# Patient Record
Sex: Male | Born: 1945 | Race: White | Hispanic: No | Marital: Married | Smoking: Current every day smoker
Health system: Southern US, Community
[De-identification: ages and names within clinical notes are randomized; demographics above are authoritative.]

## PROBLEM LIST (undated history)

## (undated) DIAGNOSIS — C61 Malignant neoplasm of prostate: Secondary | ICD-10-CM

## (undated) DIAGNOSIS — I10 Essential (primary) hypertension: Secondary | ICD-10-CM

## (undated) DIAGNOSIS — C801 Malignant (primary) neoplasm, unspecified: Secondary | ICD-10-CM

## (undated) DIAGNOSIS — J449 Chronic obstructive pulmonary disease, unspecified: Secondary | ICD-10-CM

## (undated) DIAGNOSIS — Z87442 Personal history of urinary calculi: Secondary | ICD-10-CM

## (undated) DIAGNOSIS — N189 Chronic kidney disease, unspecified: Secondary | ICD-10-CM

## (undated) DIAGNOSIS — C9590 Leukemia, unspecified not having achieved remission: Secondary | ICD-10-CM

## (undated) DIAGNOSIS — F419 Anxiety disorder, unspecified: Secondary | ICD-10-CM

## (undated) DIAGNOSIS — J189 Pneumonia, unspecified organism: Secondary | ICD-10-CM

## (undated) DIAGNOSIS — I5020 Unspecified systolic (congestive) heart failure: Secondary | ICD-10-CM

## (undated) DIAGNOSIS — I4891 Unspecified atrial fibrillation: Secondary | ICD-10-CM

## (undated) HISTORY — PX: EYE SURGERY: SHX253

## (undated) HISTORY — PX: COLON SURGERY: SHX602

---

## 1997-04-28 HISTORY — PX: PROSTATE SURGERY: SHX751

## 1997-09-05 ENCOUNTER — Other Ambulatory Visit: Admission: RE | Admit: 1997-09-05 | Discharge: 1997-09-05 | Payer: Self-pay | Admitting: Urology

## 1997-09-13 ENCOUNTER — Ambulatory Visit (HOSPITAL_COMMUNITY): Admission: RE | Admit: 1997-09-13 | Discharge: 1997-09-13 | Payer: Self-pay | Admitting: Urology

## 2003-12-29 ENCOUNTER — Ambulatory Visit: Payer: Self-pay

## 2004-03-05 ENCOUNTER — Ambulatory Visit: Payer: Self-pay | Admitting: Internal Medicine

## 2004-05-07 ENCOUNTER — Ambulatory Visit: Payer: Self-pay | Admitting: Internal Medicine

## 2004-08-13 ENCOUNTER — Ambulatory Visit: Payer: Self-pay | Admitting: Internal Medicine

## 2004-12-23 ENCOUNTER — Ambulatory Visit: Payer: Self-pay | Admitting: Internal Medicine

## 2005-06-06 ENCOUNTER — Ambulatory Visit: Payer: Self-pay | Admitting: Internal Medicine

## 2005-06-06 ENCOUNTER — Ambulatory Visit (HOSPITAL_COMMUNITY): Admission: RE | Admit: 2005-06-06 | Discharge: 2005-06-06 | Payer: Self-pay | Admitting: Internal Medicine

## 2005-06-09 ENCOUNTER — Ambulatory Visit: Payer: Self-pay | Admitting: Internal Medicine

## 2005-06-16 ENCOUNTER — Ambulatory Visit: Payer: Self-pay | Admitting: Internal Medicine

## 2005-06-23 ENCOUNTER — Ambulatory Visit: Payer: Self-pay | Admitting: Internal Medicine

## 2005-06-28 ENCOUNTER — Encounter (INDEPENDENT_AMBULATORY_CARE_PROVIDER_SITE_OTHER): Payer: Self-pay | Admitting: Internal Medicine

## 2005-07-18 ENCOUNTER — Ambulatory Visit: Payer: Self-pay | Admitting: Internal Medicine

## 2005-07-25 ENCOUNTER — Ambulatory Visit: Payer: Self-pay | Admitting: Internal Medicine

## 2005-07-27 ENCOUNTER — Emergency Department (HOSPITAL_COMMUNITY): Admission: EM | Admit: 2005-07-27 | Discharge: 2005-07-27 | Payer: Self-pay | Admitting: Emergency Medicine

## 2005-07-29 ENCOUNTER — Emergency Department (HOSPITAL_COMMUNITY): Admission: EM | Admit: 2005-07-29 | Discharge: 2005-07-29 | Payer: Self-pay | Admitting: Emergency Medicine

## 2005-08-25 ENCOUNTER — Ambulatory Visit: Payer: Self-pay | Admitting: Internal Medicine

## 2005-09-17 ENCOUNTER — Ambulatory Visit: Payer: Self-pay | Admitting: Internal Medicine

## 2005-10-01 ENCOUNTER — Ambulatory Visit: Payer: Self-pay | Admitting: Internal Medicine

## 2005-11-04 ENCOUNTER — Ambulatory Visit: Payer: Self-pay | Admitting: Internal Medicine

## 2005-12-04 ENCOUNTER — Ambulatory Visit: Payer: Self-pay | Admitting: Internal Medicine

## 2006-02-20 ENCOUNTER — Ambulatory Visit: Payer: Self-pay | Admitting: Internal Medicine

## 2006-03-13 ENCOUNTER — Ambulatory Visit: Payer: Self-pay | Admitting: Internal Medicine

## 2006-03-23 DIAGNOSIS — C9110 Chronic lymphocytic leukemia of B-cell type not having achieved remission: Secondary | ICD-10-CM | POA: Insufficient documentation

## 2006-03-23 DIAGNOSIS — R32 Unspecified urinary incontinence: Secondary | ICD-10-CM | POA: Insufficient documentation

## 2006-03-23 DIAGNOSIS — K589 Irritable bowel syndrome without diarrhea: Secondary | ICD-10-CM | POA: Insufficient documentation

## 2006-03-23 DIAGNOSIS — Z862 Personal history of diseases of the blood and blood-forming organs and certain disorders involving the immune mechanism: Secondary | ICD-10-CM | POA: Insufficient documentation

## 2006-03-23 DIAGNOSIS — Z8546 Personal history of malignant neoplasm of prostate: Secondary | ICD-10-CM

## 2006-03-23 DIAGNOSIS — I10 Essential (primary) hypertension: Secondary | ICD-10-CM | POA: Insufficient documentation

## 2006-03-23 DIAGNOSIS — I252 Old myocardial infarction: Secondary | ICD-10-CM | POA: Insufficient documentation

## 2006-03-23 DIAGNOSIS — F329 Major depressive disorder, single episode, unspecified: Secondary | ICD-10-CM | POA: Insufficient documentation

## 2006-03-23 DIAGNOSIS — E669 Obesity, unspecified: Secondary | ICD-10-CM

## 2006-03-23 DIAGNOSIS — F411 Generalized anxiety disorder: Secondary | ICD-10-CM | POA: Insufficient documentation

## 2006-03-23 DIAGNOSIS — Z8639 Personal history of other endocrine, nutritional and metabolic disease: Secondary | ICD-10-CM

## 2006-03-23 DIAGNOSIS — K5900 Constipation, unspecified: Secondary | ICD-10-CM | POA: Insufficient documentation

## 2006-03-23 DIAGNOSIS — J40 Bronchitis, not specified as acute or chronic: Secondary | ICD-10-CM | POA: Insufficient documentation

## 2006-03-31 ENCOUNTER — Ambulatory Visit: Payer: Self-pay | Admitting: Internal Medicine

## 2009-03-31 ENCOUNTER — Emergency Department (HOSPITAL_COMMUNITY): Admission: EM | Admit: 2009-03-31 | Discharge: 2009-03-31 | Payer: Self-pay | Admitting: Emergency Medicine

## 2009-04-10 ENCOUNTER — Encounter (INDEPENDENT_AMBULATORY_CARE_PROVIDER_SITE_OTHER): Payer: Self-pay | Admitting: Internal Medicine

## 2009-04-10 ENCOUNTER — Ambulatory Visit: Payer: Self-pay | Admitting: Cardiology

## 2009-04-10 ENCOUNTER — Inpatient Hospital Stay (HOSPITAL_COMMUNITY): Admission: EM | Admit: 2009-04-10 | Discharge: 2009-04-13 | Payer: Self-pay | Admitting: Emergency Medicine

## 2009-04-17 ENCOUNTER — Ambulatory Visit: Payer: Self-pay | Admitting: Cardiology

## 2009-04-17 ENCOUNTER — Inpatient Hospital Stay (HOSPITAL_COMMUNITY): Admission: EM | Admit: 2009-04-17 | Discharge: 2009-04-19 | Payer: Self-pay | Admitting: Emergency Medicine

## 2010-04-28 HISTORY — PX: PARTIAL COLECTOMY: SHX5273

## 2010-05-19 ENCOUNTER — Encounter: Payer: Self-pay | Admitting: Cardiology

## 2010-05-20 ENCOUNTER — Encounter: Payer: Self-pay | Admitting: Urology

## 2010-07-29 LAB — POCT CARDIAC MARKERS: CKMB, poc: 4 ng/mL (ref 1.0–8.0)

## 2010-07-29 LAB — CBC
HCT: 36.7 % — ABNORMAL LOW (ref 39.0–52.0)
HCT: 44.3 % (ref 39.0–52.0)
Hemoglobin: 12.4 g/dL — ABNORMAL LOW (ref 13.0–17.0)
Hemoglobin: 12.4 g/dL — ABNORMAL LOW (ref 13.0–17.0)
MCHC: 33.3 g/dL (ref 30.0–36.0)
MCV: 91.1 fL (ref 78.0–100.0)
MCV: 91.8 fL (ref 78.0–100.0)
RBC: 4 MIL/uL — ABNORMAL LOW (ref 4.22–5.81)
RBC: 4.86 MIL/uL (ref 4.22–5.81)
RDW: 15.5 % (ref 11.5–15.5)
WBC: 12 10*3/uL — ABNORMAL HIGH (ref 4.0–10.5)
WBC: 15.6 10*3/uL — ABNORMAL HIGH (ref 4.0–10.5)

## 2010-07-29 LAB — CARDIAC PANEL(CRET KIN+CKTOT+MB+TROPI)
CK, MB: 3 ng/mL (ref 0.3–4.0)
Relative Index: INVALID (ref 0.0–2.5)
Total CK: 47 U/L (ref 7–232)
Troponin I: 0.05 ng/mL (ref 0.00–0.06)

## 2010-07-29 LAB — DIFFERENTIAL
Basophils Absolute: 0 10*3/uL (ref 0.0–0.1)
Basophils Relative: 0 % (ref 0–1)
Eosinophils Absolute: 0 10*3/uL (ref 0.0–0.7)
Eosinophils Absolute: 0 10*3/uL (ref 0.0–0.7)
Eosinophils Absolute: 0.9 10*3/uL — ABNORMAL HIGH (ref 0.0–0.7)
Eosinophils Relative: 0 % (ref 0–5)
Eosinophils Relative: 0 % (ref 0–5)
Eosinophils Relative: 7 % — ABNORMAL HIGH (ref 0–5)
Lymphocytes Relative: 20 % (ref 12–46)
Lymphs Abs: 0.4 10*3/uL — ABNORMAL LOW (ref 0.7–4.0)
Lymphs Abs: 2.4 10*3/uL (ref 0.7–4.0)
Monocytes Absolute: 0.7 10*3/uL (ref 0.1–1.0)
Monocytes Relative: 3 % (ref 3–12)
Monocytes Relative: 6 % (ref 3–12)
Neutro Abs: 11.9 10*3/uL — ABNORMAL HIGH (ref 1.7–7.7)

## 2010-07-29 LAB — BASIC METABOLIC PANEL
BUN: 15 mg/dL (ref 6–23)
BUN: 21 mg/dL (ref 6–23)
CO2: 26 mEq/L (ref 19–32)
CO2: 30 mEq/L (ref 19–32)
CO2: 30 mEq/L (ref 19–32)
Calcium: 8.5 mg/dL (ref 8.4–10.5)
Calcium: 9.1 mg/dL (ref 8.4–10.5)
Chloride: 100 mEq/L (ref 96–112)
Chloride: 108 mEq/L (ref 96–112)
Chloride: 96 mEq/L (ref 96–112)
Creatinine, Ser: 0.94 mg/dL (ref 0.4–1.5)
Creatinine, Ser: 1.02 mg/dL (ref 0.4–1.5)
GFR calc Af Amer: >60 mL/min (ref >60)
GFR calc non Af Amer: >60 mL/min (ref >60)
GFR calc non Af Amer: >60 mL/min (ref >60)
Glucose, Bld: 102 mg/dL — ABNORMAL HIGH (ref 70–99)
Glucose, Bld: 126 mg/dL — ABNORMAL HIGH (ref 70–99)
Glucose, Bld: 146 mg/dL — ABNORMAL HIGH (ref 70–99)
Potassium: 2.9 mEq/L — ABNORMAL LOW (ref 3.5–5.1)
Potassium: 3.1 mEq/L — ABNORMAL LOW (ref 3.5–5.1)
Potassium: 3.7 mEq/L (ref 3.5–5.1)
Sodium: 136 mEq/L (ref 135–145)
Sodium: 140 mEq/L (ref 135–145)
Sodium: 142 mEq/L (ref 135–145)

## 2010-07-29 LAB — LIPID PANEL
Cholesterol: 131 mg/dL (ref 0–200)
HDL: 40 mg/dL (ref >39)
Total CHOL/HDL Ratio: 3.1 RATIO
Total CHOL/HDL Ratio: 3.3 RATIO
Triglycerides: 119 mg/dL (ref <150)
VLDL: 24 mg/dL (ref 0–40)

## 2010-07-29 LAB — BLOOD GAS, ARTERIAL
Acid-Base Excess: 4.9 mmol/L — ABNORMAL HIGH (ref 0.0–2.0)
Bicarbonate: 29.9 mEq/L — ABNORMAL HIGH (ref 20.0–24.0)
FIO2: 100 %
O2 Saturation: 99.5 %
TCO2: 26.9 mmol/L (ref 0–100)
pO2, Arterial: 282 mmHg — ABNORMAL HIGH (ref 80.0–100.0)

## 2010-07-29 LAB — D-DIMER, QUANTITATIVE

## 2010-07-29 LAB — MAGNESIUM: Magnesium: 1.9 mg/dL (ref 1.5–2.5)

## 2010-07-29 LAB — THEOPHYLLINE LEVEL: Theophylline Lvl: 6.8 ug/mL — ABNORMAL LOW (ref 10.0–20.0)

## 2010-07-29 LAB — GLUCOSE, CAPILLARY: Glucose-Capillary: 155 mg/dL — ABNORMAL HIGH (ref 70–99)

## 2010-07-30 LAB — CARDIAC PANEL(CRET KIN+CKTOT+MB+TROPI)
CK, MB: 3.7 ng/mL (ref 0.3–4.0)
Relative Index: INVALID (ref 0.0–2.5)
Relative Index: INVALID (ref 0.0–2.5)
Total CK: 52 U/L (ref 7–232)
Troponin I: 0.06 ng/mL (ref 0.00–0.06)

## 2010-07-30 LAB — BLOOD GAS, ARTERIAL
Acid-Base Excess: 1.7 mmol/L (ref 0.0–2.0)
Acid-Base Excess: 1.8 mmol/L (ref 0.0–2.0)
O2 Saturation: 89.1 %
TCO2: 23.5 mmol/L (ref 0–100)
pCO2 arterial: 42.8 mmHg (ref 35.0–45.0)

## 2010-07-30 LAB — DIFFERENTIAL
Basophils Absolute: 0 10*3/uL (ref 0.0–0.1)
Eosinophils Relative: 4 % (ref 0–5)
Eosinophils Relative: 8 % — ABNORMAL HIGH (ref 0–5)
Lymphocytes Relative: 18 % (ref 12–46)
Lymphocytes Relative: 26 % (ref 12–46)
Lymphs Abs: 1.8 10*3/uL (ref 0.7–4.0)
Lymphs Abs: 1.4 10*3/uL (ref 0.7–4.0)
Neutro Abs: 7.3 10*3/uL (ref 1.7–7.7)
Neutrophils Relative %: 73 % (ref 43–77)
Neutrophils Relative %: 58 % (ref 43–77)

## 2010-07-30 LAB — CBC
HCT: 35.7 % — ABNORMAL LOW (ref 39.0–52.0)
MCHC: 33.8 g/dL (ref 30.0–36.0)
MCV: 90.7 fL (ref 78.0–100.0)
MCV: 92.1 fL (ref 78.0–100.0)
Platelets: 270 10*3/uL (ref 150–400)
Platelets: 259 10*3/uL (ref 150–400)
RBC: 3.82 MIL/uL — ABNORMAL LOW (ref 4.22–5.81)
RDW: 14.5 % (ref 11.5–15.5)
RDW: 14.8 % (ref 11.5–15.5)
WBC: 9.9 10*3/uL (ref 4.0–10.5)
WBC: 12.8 10*3/uL — ABNORMAL HIGH (ref 4.0–10.5)

## 2010-07-30 LAB — COMPREHENSIVE METABOLIC PANEL
AST: 17 U/L (ref 0–37)
CO2: 30 mEq/L (ref 19–32)
Calcium: 8.9 mg/dL (ref 8.4–10.5)
Creatinine, Ser: 1.05 mg/dL (ref 0.4–1.5)
GFR calc Af Amer: >60 mL/min (ref >60)
GFR calc non Af Amer: >60 mL/min (ref >60)
Total Protein: 6.2 g/dL (ref 6.0–8.3)

## 2010-07-30 LAB — GLUCOSE, CAPILLARY

## 2010-07-30 LAB — POCT CARDIAC MARKERS
CKMB, poc: 1.6 ng/mL (ref 1.0–8.0)
Myoglobin, poc: 66.4 ng/mL (ref 12–200)
Troponin i, poc: <0.05 ng/mL (ref 0.00–0.09)

## 2010-07-30 LAB — BASIC METABOLIC PANEL
BUN: 20 mg/dL (ref 6–23)
BUN: 17 mg/dL (ref 6–23)
Calcium: 9.1 mg/dL (ref 8.4–10.5)
Chloride: 106 mEq/L (ref 96–112)
GFR calc non Af Amer: >60 mL/min (ref >60)
Glucose, Bld: 126 mg/dL — ABNORMAL HIGH (ref 70–99)
Glucose, Bld: 160 mg/dL — ABNORMAL HIGH (ref 70–99)
Potassium: 3.5 mEq/L (ref 3.5–5.1)

## 2010-07-30 LAB — BRAIN NATRIURETIC PEPTIDE: Pro B Natriuretic peptide (BNP): 45.1 pg/mL (ref 0.0–100.0)

## 2010-07-30 LAB — PROTIME-INR
INR: 0.86 (ref 0.00–1.49)
Prothrombin Time: 11.6 seconds (ref 11.6–15.2)

## 2011-01-14 ENCOUNTER — Encounter: Payer: Self-pay | Admitting: *Deleted

## 2011-01-14 ENCOUNTER — Emergency Department (HOSPITAL_COMMUNITY)
Admission: EM | Admit: 2011-01-14 | Discharge: 2011-01-14 | Discharge status: Against Medical Advice | Payer: Medicare Other | Attending: Emergency Medicine | Admitting: Emergency Medicine

## 2011-01-14 ENCOUNTER — Emergency Department (HOSPITAL_COMMUNITY): Payer: Medicare Other

## 2011-01-14 ENCOUNTER — Other Ambulatory Visit: Payer: Self-pay

## 2011-01-14 DIAGNOSIS — F17200 Nicotine dependence, unspecified, uncomplicated: Secondary | ICD-10-CM | POA: Insufficient documentation

## 2011-01-14 DIAGNOSIS — I509 Heart failure, unspecified: Secondary | ICD-10-CM | POA: Insufficient documentation

## 2011-01-14 DIAGNOSIS — J189 Pneumonia, unspecified organism: Secondary | ICD-10-CM | POA: Insufficient documentation

## 2011-01-14 DIAGNOSIS — I10 Essential (primary) hypertension: Secondary | ICD-10-CM | POA: Insufficient documentation

## 2011-01-14 DIAGNOSIS — Z8546 Personal history of malignant neoplasm of prostate: Secondary | ICD-10-CM | POA: Insufficient documentation

## 2011-01-14 DIAGNOSIS — I2510 Atherosclerotic heart disease of native coronary artery without angina pectoris: Secondary | ICD-10-CM | POA: Insufficient documentation

## 2011-01-14 DIAGNOSIS — Z856 Personal history of leukemia: Secondary | ICD-10-CM | POA: Insufficient documentation

## 2011-01-14 DIAGNOSIS — Z85038 Personal history of other malignant neoplasm of large intestine: Secondary | ICD-10-CM | POA: Insufficient documentation

## 2011-01-14 DIAGNOSIS — I440 Atrioventricular block, first degree: Secondary | ICD-10-CM | POA: Insufficient documentation

## 2011-01-14 DIAGNOSIS — R079 Chest pain, unspecified: Secondary | ICD-10-CM

## 2011-01-14 HISTORY — DX: Malignant (primary) neoplasm, unspecified: C80.1

## 2011-01-14 HISTORY — DX: Leukemia, unspecified not having achieved remission: C95.90

## 2011-01-14 HISTORY — DX: Essential (primary) hypertension: I10

## 2011-01-14 HISTORY — DX: Chronic obstructive pulmonary disease, unspecified: J44.9

## 2011-01-14 HISTORY — DX: Malignant neoplasm of prostate: C61

## 2011-01-14 LAB — COMPREHENSIVE METABOLIC PANEL
ALT: 11 U/L (ref 0–53)
Alkaline Phosphatase: 54 U/L (ref 39–117)
CO2: 30 mEq/L (ref 19–32)
GFR calc Af Amer: >60 mL/min (ref >60)
GFR calc non Af Amer: >60 mL/min (ref >60)
Glucose, Bld: 89 mg/dL (ref 70–99)
Potassium: 4.2 mEq/L (ref 3.5–5.1)
Sodium: 137 mEq/L (ref 135–145)
Total Protein: 6 g/dL (ref 6.0–8.3)

## 2011-01-14 LAB — CBC
HCT: 36.5 % — ABNORMAL LOW (ref 39.0–52.0)
Hemoglobin: 11.7 g/dL — ABNORMAL LOW (ref 13.0–17.0)
WBC: 5 10*3/uL (ref 4.0–10.5)

## 2011-01-14 MED ORDER — SODIUM CHLORIDE 0.9 % IJ SOLN
3.0000 mL | Freq: Two times a day (BID) | INTRAMUSCULAR | Status: DC
  Filled 2011-01-14: qty 3

## 2011-01-14 MED ORDER — ASPIRIN 81 MG PO CHEW
324.0000 mg | CHEWABLE_TABLET | Freq: Once | ORAL | Status: AC
  Administered 2011-01-14: 324 mg via ORAL
  Filled 2011-01-14: qty 4

## 2011-01-14 MED ORDER — NITROGLYCERIN 2 % TD OINT
1.0000 [in_us] | TOPICAL_OINTMENT | Freq: Four times a day (QID) | TRANSDERMAL | Status: DC
  Administered 2011-01-14: 1 [in_us] via TOPICAL
  Filled 2011-01-14: qty 1

## 2011-01-14 MED ORDER — SODIUM CHLORIDE 0.9 % IJ SOLN
3.0000 mL | INTRAMUSCULAR | Status: DC | PRN
  Filled 2011-01-14: qty 3

## 2011-01-14 MED ORDER — MORPHINE SULFATE 4 MG/ML IJ SOLN
4.0000 mg | Freq: Once | INTRAMUSCULAR | Status: AC
  Administered 2011-01-14: 4 mg via INTRAVENOUS
  Filled 2011-01-14: qty 1

## 2011-01-14 MED ORDER — MOXIFLOXACIN HCL 400 MG PO TABS: 400.0000 mg | ORAL_TABLET | Freq: Every day | ORAL | Status: AC

## 2011-01-14 NOTE — ED Notes (Signed)
Reports increased sob of the last 3 days. Describes chest pain as a pressure, ha sused nebulizers w/ little relief.

## 2011-01-14 NOTE — ED Notes (Signed)
Pt self ambulated out with a steady gait stating no needs. ama papers signed by pt and doctor

## 2011-01-14 NOTE — ED Provider Notes (Signed)
Scribed for Mitchell Kras, MD, the patient was seen in room APA02/APA02 . This chart was scribed by Mitchell [name]. This patient's care was started at 8:24 PM.   CSN: [id number] Arrival date & time: 01/14/2011  8:21 PM   Chief Complaint  Patient presents with  . Shortness of Breath  . Chest Pain     (Include location/radiation/quality/duration/timing/severity/associated sxs/prior treatment) HPI Mitchell [name] is a 65 y.o. male with a history of COPD presents to the Emergency Department complaining of SOB that has become progressively worse for the past week with associated chest pain. Chest pain is described as a tightness/pressure that is aggravated by coughing and rated 6/10 in severity. Pt has treated with nebulizer with little improvement. Pt denies fever. Pt reports a hx of similar sx. Pt has hx of pneumonia. Denies hx of MI. Reports having a catheretization. There are no other associated symptoms and no other alleviating or aggravating factors.   Past Medical History  Diagnosis Date  . COPD (chronic obstructive pulmonary disease)   . Cancer   . Prostate cancer   . Colon cancer   . Leukemia   . Hypertension     Past Surgical History  Procedure Date  . Colon surgery   . Prostate surgery     History reviewed. No pertinent family history.  History  Substance Use Topics  . Smoking status: Current Everyday Smoker -- 0.5 packs/day  . Smokeless tobacco: Not on file  . Alcohol Use: No    Review of Systems 10 Systems reviewed and are negative for acute change except as noted in the HPI.  Allergies  Review of patient's allergies indicates no known allergies.  Home Medications  No current outpatient prescriptions on file.  Physical Exam    BP 130/75  Pulse 67  Temp(Src) 98.5 F (36.9 C) (Oral)  Resp 23  Ht 6' (1.829 m)  Wt 226 lb (102.513 kg)  BMI 30.65 kg/m2  SpO2 96%  Physical Exam  Nursing note and vitals reviewed. Constitutional: He appears well-developed  and well-nourished. No distress.       Appears obese   HENT:  Head: Normocephalic and atraumatic.  Right Ear: External ear normal.  Left Ear: External ear normal.  Eyes: Conjunctivae are normal. Right eye exhibits no discharge. Left eye exhibits no discharge. No scleral icterus.  Neck: Neck supple. No JVD present. No tracheal deviation present.  Cardiovascular: Normal rate, regular rhythm and intact distal pulses.   Pulmonary/Chest: Effort normal and breath sounds normal. No stridor. No respiratory distress. He has no wheezes. He has no rales. He exhibits no tenderness (No tenderness to palpation).       Lungs clear to auscultation.   Abdominal: Soft. Bowel sounds are normal. He exhibits no distension. There is no tenderness. There is no rebound and no guarding.  Musculoskeletal: He exhibits no edema and no tenderness.  Neurological: He is alert. He has normal strength. No sensory deficit. Cranial nerve deficit:  no gross defecits noted. He exhibits normal muscle tone. He displays no seizure activity. Coordination normal.  Skin: Skin is warm and dry. No rash noted.  Psychiatric: He has a normal mood and affect.   Procedures  OTHER DATA REVIEWED: Nursing notes, vital signs, and past medical records reviewed.  DIAGNOSTIC STUDIES: Oxygen Saturation is 96% on room air, normal by my interpretation.    LABS / RADIOLOGY:  Labs Reviewed  CBC - Abnormal; Notable for the following:    RBC 3.80 (*)  Hemoglobin 11.7 (*)    HCT 36.5 (*)    All other components within normal limits  PRO B NATRIURETIC PEPTIDE  COMPREHENSIVE METABOLIC PANEL  TROPONIN I   Dg Chest Portable 1 View  01/14/2011  *RADIOLOGY REPORT*  Clinical Data: 65 year old male with shortness of breath and chest pain.  PORTABLE CHEST - 1 VIEW  Comparison: 04/17/2009 and earlier.  Findings: Portable semi upright AP view at 2046 hours.  Loss of the hemidiaphragm contour.  Right lung base air bronchograms.  No pneumothorax,  pulmonary edema or large effusion.  Upper lobes are clear. Visualized tracheal air column is within normal limits. Stable cardiac size and mediastinal contours.  IMPRESSION: Loss of the contour of the diaphragm compatible with bibasilar airspace disease.  Air bronchograms noted on the right.  While nonspecific, consider pneumonia, aspiration.  Original Report Authenticated By: Harley Hallmark, M.D.    ED COURSE / COORDINATION OF CARE: 20:24 EDP at Pt bedside. Discussed with Pt treatment plan including O2 supplementation,  ECG, cardiac screening panel, and chest x ray.   Date: 01/14/2011  Rate: 67  Rhythm: normal sinus rhythm  QRS Axis: left  Intervals: normal  ST/T Wave abnormalities: normal  Conduction Disutrbances:first-degree A-V block   Narrative Interpretation: sinus arrhythmia  Old EKG Reviewed: unchanged   MDM: Patient has history of COPD without known history of coronary artery disease. He presents with symptoms that are concerning for the possibility of angina. However, he does have a possible pneumonia on the chest x-ray does have an increase in his BMP suggesting the possibility of congestive heart failure. At this point I recommend admission. I will start IV antibiotics.  Admission diagnosis: Community acquired pneumonia 2 congestive heart failure SCRIBE ATTESTATION: I personally performed the services described in this documentation, which was scribed in my presence.  The recorded information has been reviewed and considered.    10:18 PM I discussed the findings with the patient and explained to him that I am  concerned he could be having a heart attack. I explained that I cannot definitively exclude that based on the findings today. I told them I recommended admission with treatment with antibiotics. Patient is here with a family member and it cleared him that he could be having a life-threatening condition. Patient understands that and stated he was going home. We'll discharge  him AGAINST MEDICAL ADVICE. I will prescribe a prescription for oral antibiotics.     Mitchell Kras, MD 01/14/11 2219

## 2011-01-14 NOTE — ED Notes (Signed)
Chest pressure 1/10 after nitro patch

## 2011-01-30 ENCOUNTER — Encounter (HOSPITAL_COMMUNITY): Payer: Self-pay | Admitting: Emergency Medicine

## 2011-01-30 ENCOUNTER — Other Ambulatory Visit: Payer: Self-pay

## 2011-01-30 ENCOUNTER — Emergency Department (HOSPITAL_COMMUNITY): Payer: Medicare Other

## 2011-01-30 ENCOUNTER — Emergency Department (HOSPITAL_COMMUNITY)
Admission: EM | Admit: 2011-01-30 | Discharge: 2011-01-30 | Disposition: A | Discharge status: Home / Self Care | Payer: Medicare Other | Attending: Emergency Medicine | Admitting: Emergency Medicine

## 2011-01-30 DIAGNOSIS — R059 Cough, unspecified: Secondary | ICD-10-CM | POA: Insufficient documentation

## 2011-01-30 DIAGNOSIS — F17200 Nicotine dependence, unspecified, uncomplicated: Secondary | ICD-10-CM | POA: Insufficient documentation

## 2011-01-30 DIAGNOSIS — J4489 Other specified chronic obstructive pulmonary disease: Secondary | ICD-10-CM | POA: Insufficient documentation

## 2011-01-30 DIAGNOSIS — J449 Chronic obstructive pulmonary disease, unspecified: Secondary | ICD-10-CM

## 2011-01-30 DIAGNOSIS — Z79899 Other long term (current) drug therapy: Secondary | ICD-10-CM | POA: Insufficient documentation

## 2011-01-30 DIAGNOSIS — R05 Cough: Secondary | ICD-10-CM | POA: Insufficient documentation

## 2011-01-30 DIAGNOSIS — Z9981 Dependence on supplemental oxygen: Secondary | ICD-10-CM | POA: Insufficient documentation

## 2011-01-30 DIAGNOSIS — R0602 Shortness of breath: Secondary | ICD-10-CM | POA: Insufficient documentation

## 2011-01-30 HISTORY — DX: Pneumonia, unspecified organism: J18.9

## 2011-01-30 LAB — DIFFERENTIAL
Basophils Absolute: 0 10*3/uL (ref 0.0–0.1)
Basophils Relative: 1 % (ref 0–1)
Lymphocytes Relative: 31 % (ref 12–46)
Monocytes Absolute: 0.5 10*3/uL (ref 0.1–1.0)
Monocytes Relative: 10 % (ref 3–12)
Neutro Abs: 2.2 10*3/uL (ref 1.7–7.7)
Neutrophils Relative %: 49 % (ref 43–77)

## 2011-01-30 LAB — CBC
HCT: 38.5 % — ABNORMAL LOW (ref 39.0–52.0)
Hemoglobin: 12.6 g/dL — ABNORMAL LOW (ref 13.0–17.0)
MCHC: 32.7 g/dL (ref 30.0–36.0)
RDW: 14 % (ref 11.5–15.5)
WBC: 4.5 10*3/uL (ref 4.0–10.5)

## 2011-01-30 LAB — BASIC METABOLIC PANEL
Chloride: 103 mEq/L (ref 96–112)
GFR calc Af Amer: >90 mL/min (ref >90)
GFR calc non Af Amer: 85 mL/min — ABNORMAL LOW (ref >90)
Potassium: 3.6 mEq/L (ref 3.5–5.1)

## 2011-01-30 MED ORDER — PREDNISONE 50 MG PO TABS: 50.0000 mg | ORAL_TABLET | Freq: Every day | ORAL | Status: AC

## 2011-01-30 MED ORDER — PREDNISONE 20 MG PO TABS
60.0000 mg | ORAL_TABLET | Freq: Once | ORAL | Status: AC
  Administered 2011-01-30: 60 mg via ORAL
  Filled 2011-01-30: qty 3

## 2011-01-30 MED ORDER — IPRATROPIUM BROMIDE 0.02 % IN SOLN
0.5000 mg | Freq: Once | RESPIRATORY_TRACT | Status: AC
  Administered 2011-01-30: 0.5 mg via RESPIRATORY_TRACT
  Filled 2011-01-30: qty 2.5

## 2011-01-30 MED ORDER — SODIUM CHLORIDE 0.9 % IN NEBU
INHALATION_SOLUTION | RESPIRATORY_TRACT | Status: AC
  Filled 2011-01-30: qty 3

## 2011-01-30 MED ORDER — HYDROCODONE-ACETAMINOPHEN 5-500 MG PO TABS: 1.0000 | ORAL_TABLET | Freq: Four times a day (QID) | ORAL | Status: AC | PRN

## 2011-01-30 MED ORDER — ALBUTEROL SULFATE (5 MG/ML) 0.5% IN NEBU
5.0000 mg | INHALATION_SOLUTION | Freq: Once | RESPIRATORY_TRACT | Status: AC
  Administered 2011-01-30: 5 mg via RESPIRATORY_TRACT
  Filled 2011-01-30: qty 1

## 2011-01-30 NOTE — ED Provider Notes (Signed)
History   Scribed for Dr. Bebe Shaggy, the patient was seen in room APA06/APA06. This chart was scribed by Clarita Crane. This patient's care was started at 12:51PM.  CSN: [id number] Arrival date & time: 01/30/2011 12:11 PM  Chief Complaint  Patient presents with  . Cough   HPI Mitchell [name] is a 65 y.o. male who presents to the Emergency Department complaining of constant SOB onset several days ago and persistent since with associated mild productive cough. Patient also notes have a syncopal episode 4 days ago while walking in a [employer]. Denies chest pain, fever, hemoptysis, nausea, vomiting, diarrhea, hematochezia.  Patient with h/o COPD, colon cancer, prostate cancer and leukemia, Is currently being treated for prostate cancer and leukemia. Patient is a current smoker and is on home O2 10-12 hours per day. Denies recent hospitalizations.   HPI ELEMENTS: Onset: several days ago Duration: persistent since onset  Timing: constant   Context:  as above  Associated symptoms: +productive cough.  Denies chest pain, fever, hemoptysis, nausea, vomiting, diarrhea, hematochezia.   PAST MEDICAL HISTORY:  Past Medical History  Diagnosis Date  . COPD (chronic obstructive pulmonary disease)   . Cancer   . Prostate cancer   . Colon cancer   . Leukemia   . Hypertension   . Pneumonia   . Leukemia     PAST SURGICAL HISTORY:  Past Surgical History  Procedure Date  . Colon surgery   . Prostate surgery     FAMILY HISTORY:  History reviewed. No pertinent family history.   SOCIAL HISTORY: History   Social History  . Marital Status: Married    Spouse Name: N/A    Number of Children: N/A  . Years of Education: N/A   Social History Main Topics  . Smoking status: Current Everyday Smoker -- 0.5 packs/day  . Smokeless tobacco: None  . Alcohol Use: No  . Drug Use: No  . Sexually Active:    Other Topics Concern  . None   Social History Narrative  . None      Review of Systems 10  Systems reviewed and are negative for acute change except as noted in the HPI.  Allergies  Review of patient's allergies indicates no known allergies.  Home Medications   Current Outpatient Rx  Name Route Sig Dispense Refill  . ALBUTEROL SULFATE HFA 108 (90 BASE) MCG/ACT IN AERS Inhalation Inhale 2 puffs into the lungs every 6 (six) hours as needed. For shortness of breath     . ALPRAZOLAM 1 MG PO TABS Oral Take 1 mg by mouth at bedtime as needed. For sleep     . CITALOPRAM HYDROBROMIDE 40 MG PO TABS Oral Take 40 mg by mouth daily.      Marland Kitchen DOCUSATE SODIUM 100 MG PO CAPS Oral Take 100 mg by mouth as needed. For constipation     . FLUTICASONE-SALMETEROL 250-50 MCG/DOSE IN AEPB Inhalation Inhale 1 puff into the lungs every 12 (twelve) hours.      Marland Kitchen HYDROCODONE-ACETAMINOPHEN 10-500 MG PO TABS Oral Take 1 tablet by mouth 3 (three) times daily as needed. For pain    . IMATINIB MESYLATE 400 MG PO TABS Oral Take 400 mg by mouth every evening. Take with meals and large glass of water.Caution:Chemotherapy.     Marland Kitchen LISINOPRIL-HYDROCHLOROTHIAZIDE 20-25 MG PO TABS Oral Take 2 tablets by mouth daily.      . MOMETASONE FUROATE 0.1 % EX CREA Topical Apply 1 application topically daily as needed. Skin lesions     .  POTASSIUM CHLORIDE 10 MEQ PO TBCR Oral Take 30 mEq by mouth daily.      . THEOPHYLLINE 300 MG PO CP24 Oral Take 300 mg by mouth 2 (two) times daily.      . VENLAFAXINE HCL 75 MG PO TABS Oral Take 75 mg by mouth 2 (two) times daily.      Marland Kitchen LUPRON IJ Injection Inject 1 each as directed every 4 (four) months.      . OXYCODONE HCL 10 MG PO TB12 Oral Take 10 mg by mouth every 12 (twelve) hours.      . THEOPHYLLINE PO Oral Take 1 tablet by mouth 2 (two) times daily.     . EFFEXOR PO Oral Take 1 tablet by mouth 2 (two) times daily.        BP 110/49  Pulse 94  Temp 98.5 F (36.9 C)  Resp 20  Ht 6' (1.829 m)  Wt 223 lb (101.152 kg)  BMI 30.24 kg/m2  SpO2 95%  Physical Exam CONSTITUTIONAL: Well  developed/well nourished HEAD AND FACE: Normocephalic/atraumatic EYES: EOMI/PERRL, conjunctiva pink NECK: supple no meningeal signs CV: S1/S2 noted, no murmurs/rubs/gallops noted, DP and PT pulses strong LUNGS: wheezing bilaterally, pt able to speak to me clearly ABDOMEN: soft, nontender, no rebound or guarding GU:no cva tenderness NEURO: Pt is awake/alert, moves all extremitiesx4 EXTREMITIES: pulses normal, full ROM SKIN: warm, pallor PSYCH: no abnormalities of mood noted  ED Course  Procedures MDM   OTHER DATA REVIEWED: Nursing notes, vital signs, and past medical records reviewed. Lab results reviewed and considered Imaging results reviewed and considered  DIAGNOSTIC STUDIES: Oxygen Saturation is 94% on nasal canula-2L, adequate by my interpretation.     Date: 01/30/2011  Rate: 90  Rhythm: normal sinus rhythm  QRS Axis: left  Intervals: normal  ST/T Wave abnormalities: nonspecific ST changes  Conduction Disutrbances:none  Narrative Interpretation:   Old EKG Reviewed: unchanged  LABS / RADIOLOGY: Results for orders placed during the hospital encounter of 01/30/11  BASIC METABOLIC PANEL      Component Value Range   Sodium 139  135 - 145 (mEq/L)   Potassium 3.6  3.5 - 5.1 (mEq/L)   Chloride 103  96 - 112 (mEq/L)   CO2 29  19 - 32 (mEq/L)   Glucose, Bld 113 (*) 70 - 99 (mg/dL)   BUN 19  6 - 23 (mg/dL)   Creatinine, Ser 9.56  0.50 - 1.35 (mg/dL)   Calcium 9.4  8.4 - 21.3 (mg/dL)   GFR calc non Af Amer 85 (*) >90 (mL/min)   GFR calc Af Amer >90  >90 (mL/min)  CBC      Component Value Range   WBC 4.5  4.0 - 10.5 (K/uL)   RBC 4.13 (*) 4.22 - 5.81 (MIL/uL)   Hemoglobin 12.6 (*) 13.0 - 17.0 (g/dL)   HCT 08.6 (*) 57.8 - 52.0 (%)   MCV 93.2  78.0 - 100.0 (fL)   MCH 30.5  26.0 - 34.0 (pg)   MCHC 32.7  30.0 - 36.0 (g/dL)   RDW 46.9  62.9 - 52.8 (%)   Platelets 185  150 - 400 (K/uL)  DIFFERENTIAL      Component Value Range   Neutrophils Relative 49  43 - 77 (%)    Neutro Abs 2.2  1.7 - 7.7 (K/uL)   Lymphocytes Relative 31  12 - 46 (%)   Lymphs Abs 1.4  0.7 - 4.0 (K/uL)   Monocytes Relative 10  3 - 12 (%)  Monocytes Absolute 0.5  0.1 - 1.0 (K/uL)   Eosinophils Relative 10 (*) 0 - 5 (%)   Eosinophils Absolute 0.4  0.0 - 0.7 (K/uL)   Basophils Relative 1  0 - 1 (%)   Basophils Absolute 0.0  0.0 - 0.1 (K/uL)  PRO B NATRIURETIC PEPTIDE      Component Value Range   BNP, POC 572.3 (*) 0 - 125 (pg/mL)   Dg Chest 2 View  01/30/2011  *RADIOLOGY REPORT*  Clinical Data: Cough, congestion, shortness of breath, COPD, smoker; past history prostate cancer, colon cancer, leukemia  CHEST - 2 VIEW  Comparison: 01/14/2011  Findings: Enlargement of cardiac silhouette. Tortuous aorta with atherosclerotic calcification. Pulmonary vascularity normal. Emphysematous and chronic bronchitic changes. No acute infiltrate, pleural effusion or pneumothorax. Minimal end plate spur formation thoracic spine. No acute osseous abnormalities identified.  IMPRESSION: Emphysematous and chronic bronchitic changes. No acute abnormalities.  Original Report Authenticated By: Lollie Marrow, M.D.    PROCEDURES:  ED COURSE / COORDINATION OF CARE: Orders Placed This Encounter  Procedures  . DG Chest 2 View  . Basic metabolic panel  . CBC  . Differential  . Pro b natriuretic peptide  . Ambulate in hall  . ED EKG  2:28PM- Patient reports that he is experiencing pain at this time and that he usually takes 30mg  Hydrocodone which is the only medication which helps with his pain. Patient informed of intent to administer an additional breathing treatment. Patient agrees with the plan at this time.  3:20PM- Patient reports feeling significantly better following administration of second breathing treatment. Additional history obtained from patient and family member regarding patient's chronic syncopal episodes and recent syncopal episode. Patient states he has been evaluated for syncopal episodes  previously and was told syncopal episodes are caused by a drop in blood pressure with a change in position. Patient reports most recent syncopal episodes 4 days ago was associated with a change in position similar to that which has caused syncopal episodes previously. Patient states he feels well enough to be discharged home. Informed of intent to prescribe Prednisone to which patient and family member report has helped him significantly with SOB in the past.  Pt denied any significant CP Stable for d/c Bmp improved, suspicion for acute CHF low Doubt PE/ACS at this time  MEDICATIONS GIVEN IN THE E.D.  Medications  mometasone (ELOCON) 0.1 % cream (not administered)  theophylline (THEO-24) 300 MG 24 hr capsule (not administered)  venlafaxine (EFFEXOR) 75 MG tablet (not administered)  potassium chloride (KLOR-CON) 10 MEQ CR tablet (not administered)  sodium chloride 0.9 % nebulizer solution (not administered)  albuterol (PROVENTIL) (5 MG/ML) 0.5% nebulizer solution 5 mg (5 mg Nebulization Given 01/30/11 1318)  ipratropium (ATROVENT) nebulizer solution 0.5 mg (0.5 mg Nebulization Given 01/30/11 1318)  predniSONE (DELTASONE) tablet 60 mg (60 mg Oral Given 01/30/11 1323)  albuterol (PROVENTIL) (5 MG/ML) 0.5% nebulizer solution 5 mg (5 mg Nebulization Given 01/30/11 1430)      I personally performed the services described in this documentation, which was scribed in my presence. The recorded information has been reviewed and considered.    Joya Gaskins, MD 01/30/11 [phone]

## 2011-01-30 NOTE — ED Notes (Signed)
Pt ambulated in hall sats 90-93% ra. Breathing non-labored. nad noted.

## 2011-01-30 NOTE — ED Notes (Signed)
Here over  A week ago for same sx's and refused to be admitted. Pt c/o sob/cough with no relief from nebs and inhalers. Pt alert/roiented. Slight accessory muscle use. Ambulatory on arrival. Color pale but wife states is normal for him. Pain to chest-worse with coughing.

## 2011-02-22 IMAGING — CR DG CHEST 2V
2 series · 2 of 2 positions shown · non-contrast
Comparison: None

CLINICAL DATA: Congestion, cough and fever.

CHEST - 2 VIEW

[view not recorded (1 of 2)]
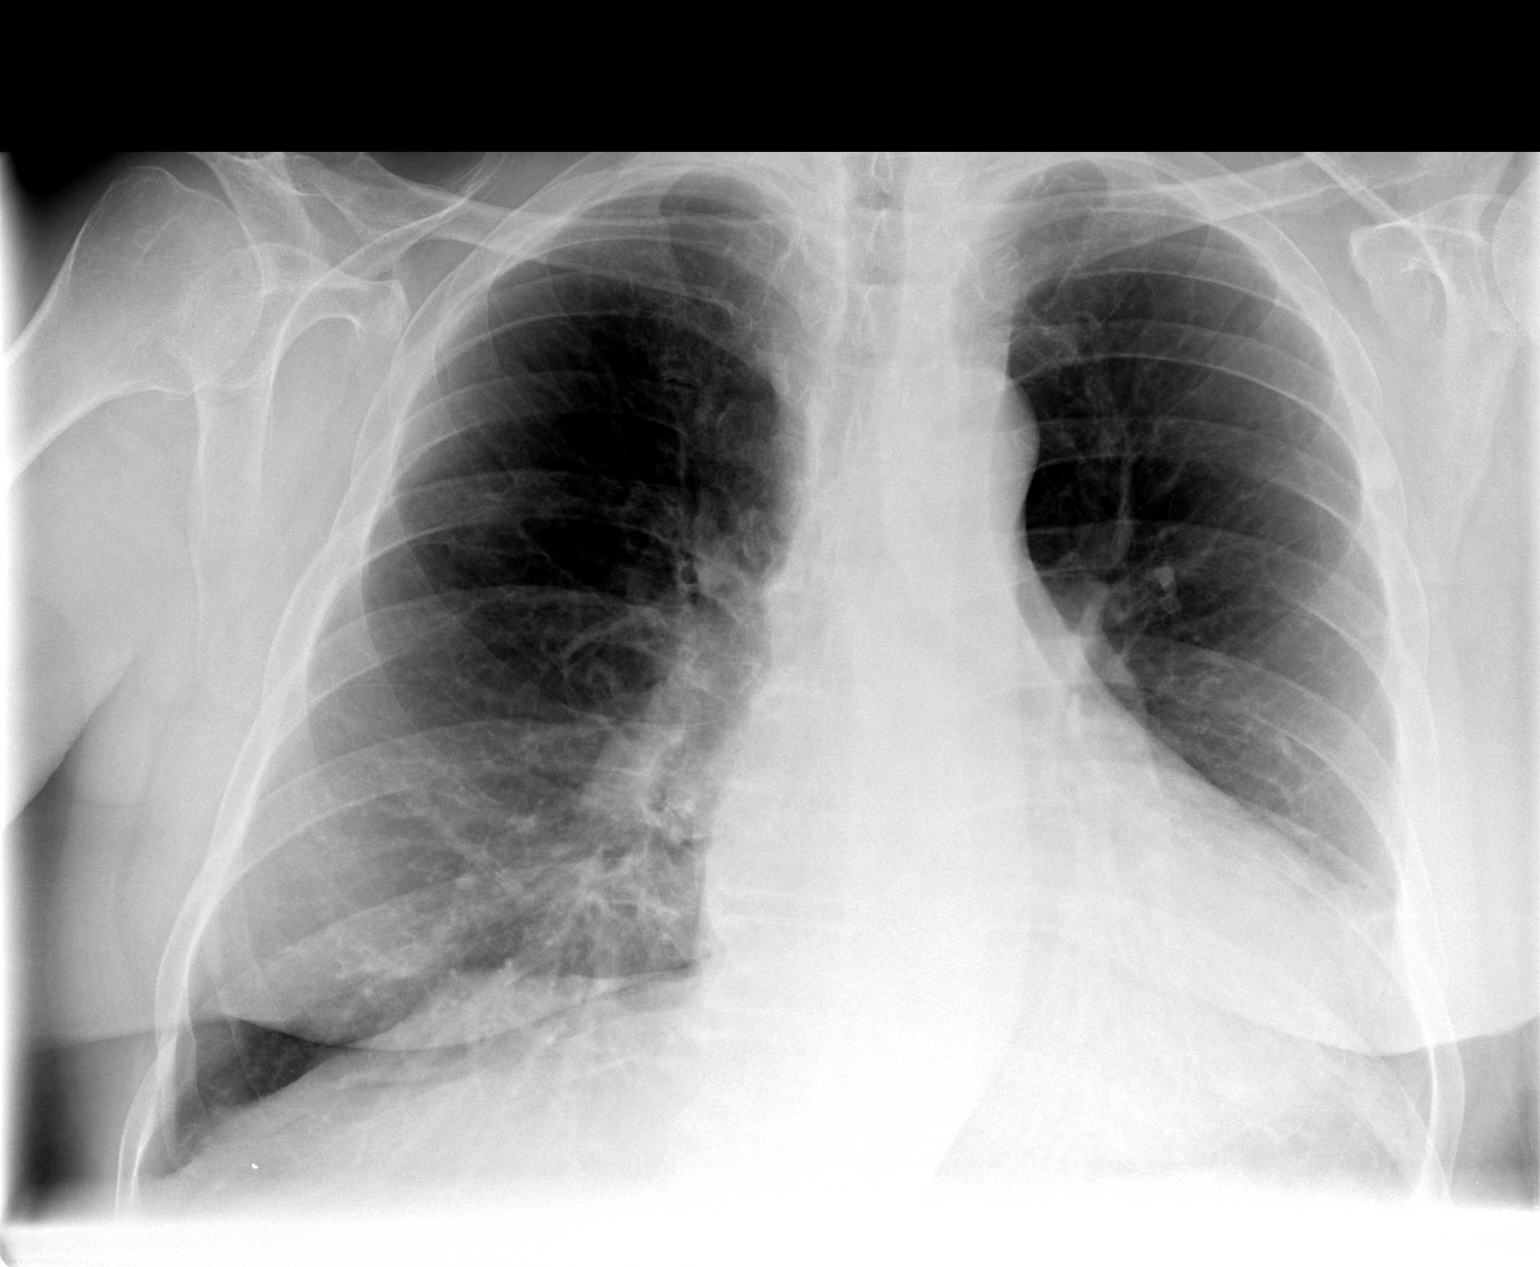

[view not recorded (2 of 2)]
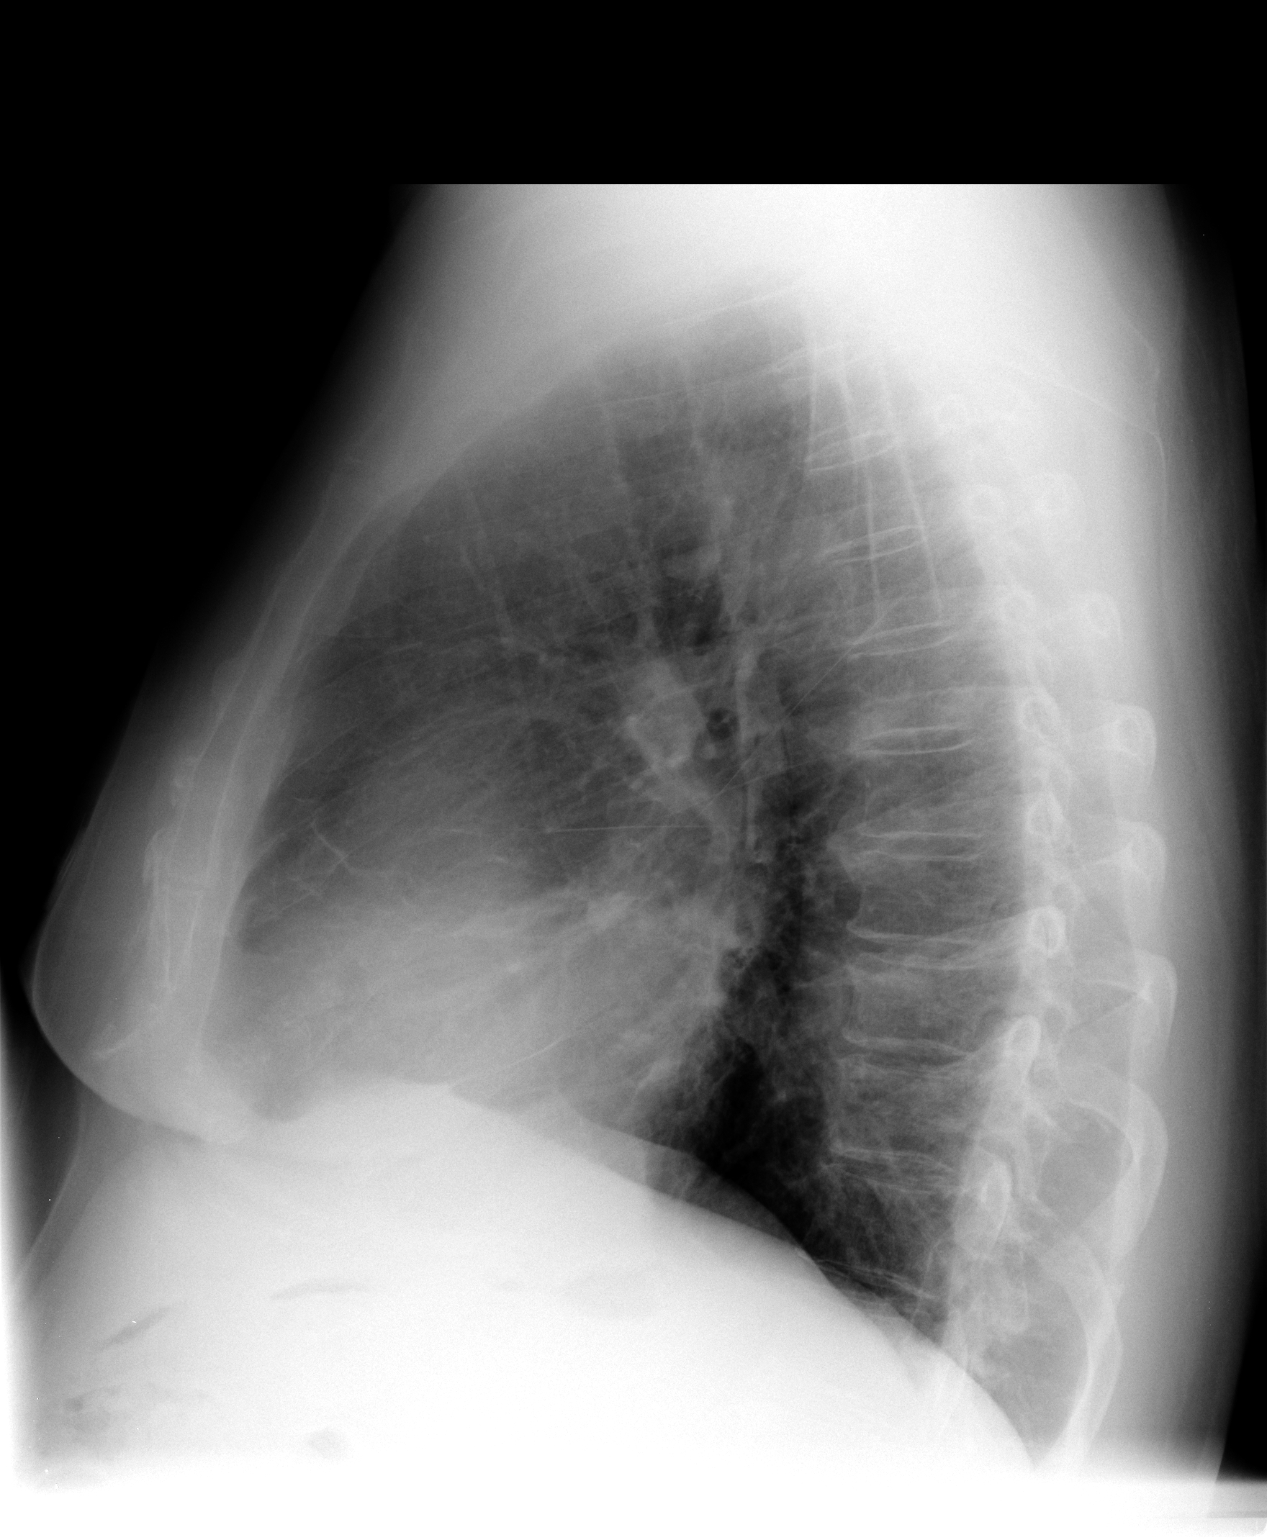

[2 of 2 positions shown; findings below may reference images not displayed]

FINDINGS: The heart is enlarged.  The mediastinal and hilar
contours are within normal limits.  There is mild elevation of the
right hemidiaphragm with overlying vascular crowding and
atelectasis.  No definite infiltrates, edema or effusions.  The
bony thorax is intact.
IMPRESSION: No acute cardiopulmonary findings.

## 2011-02-23 IMAGING — CR DG CHEST 1V PORT
1 series · 1 of 1 positions shown · non-contrast
Comparison: 04/10/2009.

CLINICAL DATA: 63-year-old male with shortness of breath.  COPD
exacerbation.

PORTABLE CHEST - 1 VIEW

[view not recorded]
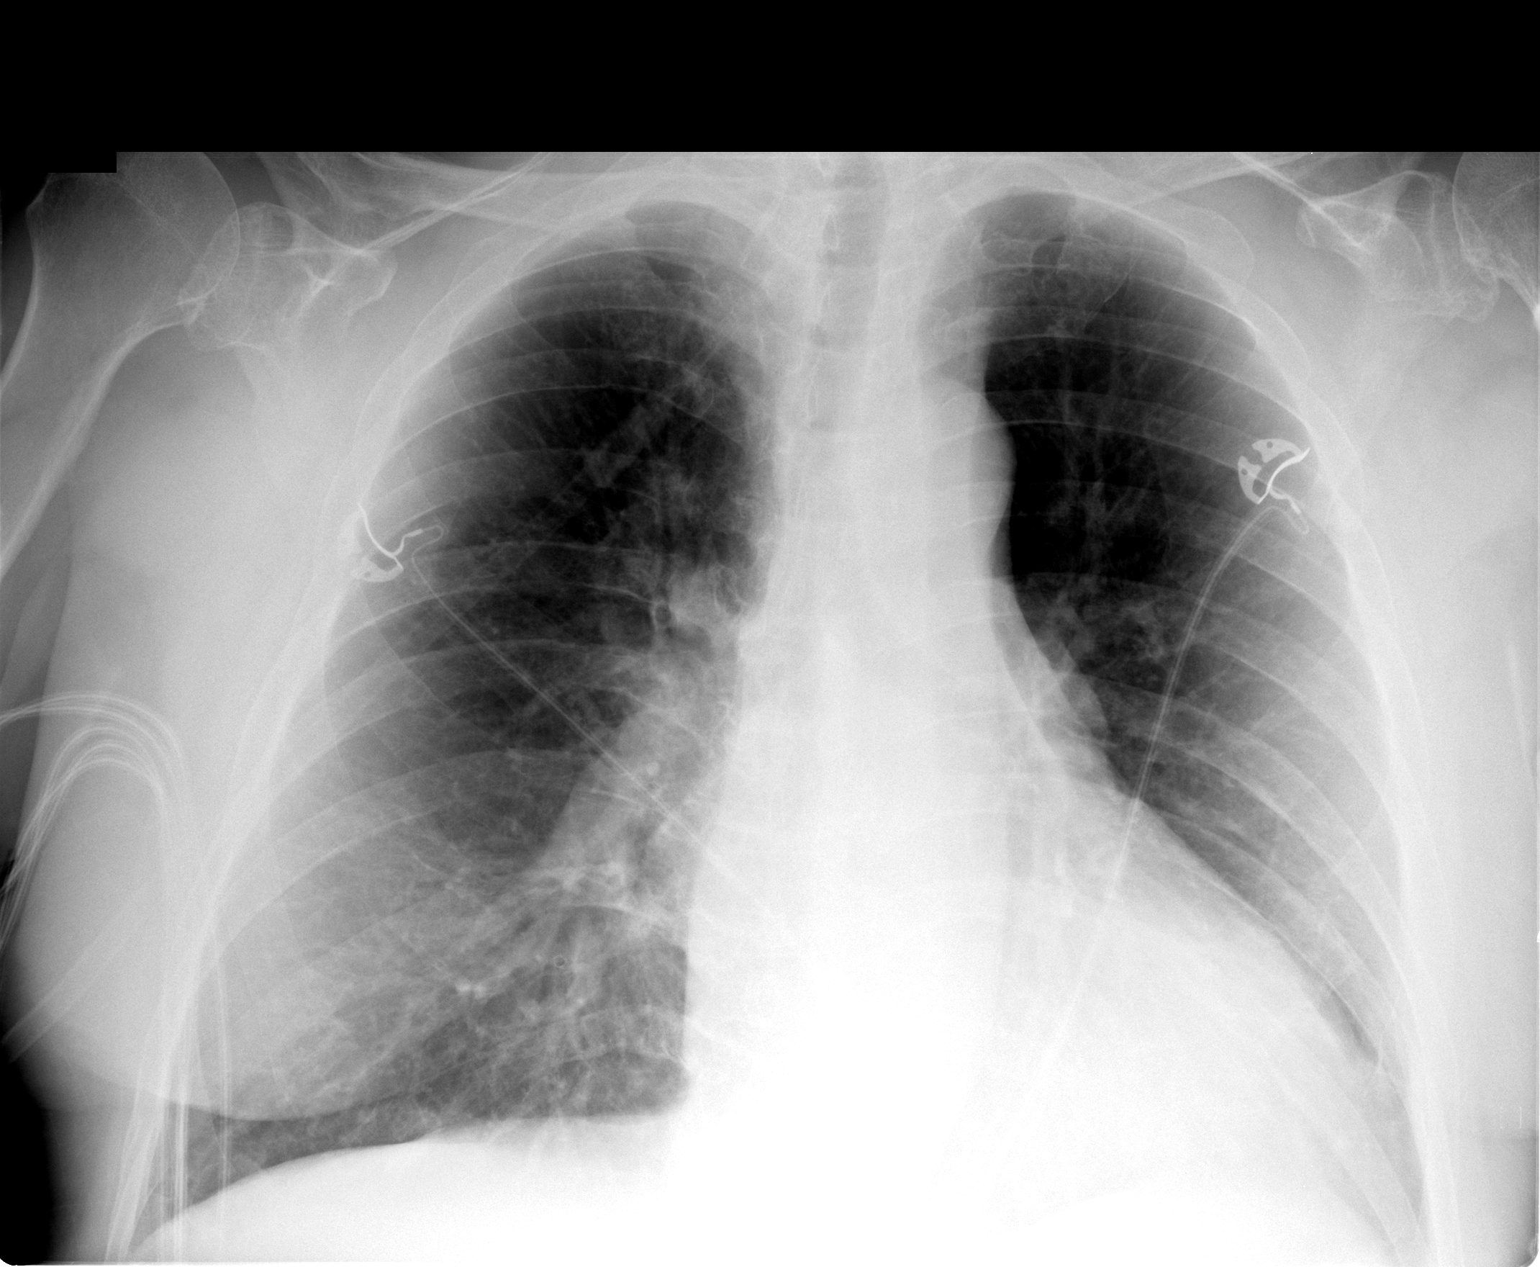

[1 of 1 positions shown; findings below may reference images not displayed]

FINDINGS: AP portable seated upright view 5966 hours. Stable
cardiomegaly and mediastinal contours.  Unchanged mild retrocardiac
opacity.  Crowding of markings at the right base as well.  No
confluent airspace opacity or consolidation.  No pneumothorax,
pulmonary edema, or effusion.
IMPRESSION: Stable.  Mild bibasilar opacity could reflect early pulmonary
infection or atelectasis.

## 2011-07-06 DIAGNOSIS — I4891 Unspecified atrial fibrillation: Secondary | ICD-10-CM

## 2011-07-07 DIAGNOSIS — I4891 Unspecified atrial fibrillation: Secondary | ICD-10-CM

## 2011-07-09 DIAGNOSIS — R0602 Shortness of breath: Secondary | ICD-10-CM

## 2011-08-20 ENCOUNTER — Other Ambulatory Visit (HOSPITAL_COMMUNITY): Payer: Medicare Other

## 2011-08-21 ENCOUNTER — Emergency Department (HOSPITAL_COMMUNITY): Payer: Medicare Other

## 2011-08-21 ENCOUNTER — Encounter (HOSPITAL_COMMUNITY): Payer: Self-pay | Admitting: Anesthesiology

## 2011-08-21 ENCOUNTER — Ambulatory Visit (HOSPITAL_COMMUNITY)
Admission: RE | Admit: 2011-08-21 | Discharge: 2011-08-21 | Disposition: A | Discharge status: Other Acute Inpatient Hospital | Payer: Medicare Other | Source: Ambulatory Visit | Attending: Urology | Admitting: Urology

## 2011-08-21 ENCOUNTER — Encounter (HOSPITAL_COMMUNITY)
Admission: RE | Disposition: A | Discharge status: Other Acute Inpatient Hospital | Payer: Self-pay | Source: Ambulatory Visit | Attending: Urology

## 2011-08-21 ENCOUNTER — Inpatient Hospital Stay (HOSPITAL_COMMUNITY)
Admission: EM | Admit: 2011-08-21 | Discharge: 2011-08-26 | DRG: 988 | Disposition: A | Discharge status: Home / Self Care | Payer: Medicare Other | Attending: Internal Medicine | Admitting: Internal Medicine

## 2011-08-21 ENCOUNTER — Encounter (HOSPITAL_COMMUNITY): Payer: Self-pay

## 2011-08-21 DIAGNOSIS — R32 Unspecified urinary incontinence: Secondary | ICD-10-CM

## 2011-08-21 DIAGNOSIS — K589 Irritable bowel syndrome without diarrhea: Secondary | ICD-10-CM

## 2011-08-21 DIAGNOSIS — C61 Malignant neoplasm of prostate: Secondary | ICD-10-CM | POA: Diagnosis present

## 2011-08-21 DIAGNOSIS — Z8546 Personal history of malignant neoplasm of prostate: Secondary | ICD-10-CM

## 2011-08-21 DIAGNOSIS — I10 Essential (primary) hypertension: Secondary | ICD-10-CM | POA: Diagnosis present

## 2011-08-21 DIAGNOSIS — F329 Major depressive disorder, single episode, unspecified: Secondary | ICD-10-CM

## 2011-08-21 DIAGNOSIS — E669 Obesity, unspecified: Secondary | ICD-10-CM | POA: Diagnosis present

## 2011-08-21 DIAGNOSIS — Z7901 Long term (current) use of anticoagulants: Secondary | ICD-10-CM

## 2011-08-21 DIAGNOSIS — I5023 Acute on chronic systolic (congestive) heart failure: Principal | ICD-10-CM | POA: Diagnosis present

## 2011-08-21 DIAGNOSIS — J189 Pneumonia, unspecified organism: Secondary | ICD-10-CM

## 2011-08-21 DIAGNOSIS — I509 Heart failure, unspecified: Secondary | ICD-10-CM | POA: Diagnosis present

## 2011-08-21 DIAGNOSIS — F411 Generalized anxiety disorder: Secondary | ICD-10-CM

## 2011-08-21 DIAGNOSIS — Z862 Personal history of diseases of the blood and blood-forming organs and certain disorders involving the immune mechanism: Secondary | ICD-10-CM

## 2011-08-21 DIAGNOSIS — I252 Old myocardial infarction: Secondary | ICD-10-CM

## 2011-08-21 DIAGNOSIS — C9110 Chronic lymphocytic leukemia of B-cell type not having achieved remission: Secondary | ICD-10-CM

## 2011-08-21 DIAGNOSIS — K5900 Constipation, unspecified: Secondary | ICD-10-CM

## 2011-08-21 DIAGNOSIS — Z8639 Personal history of other endocrine, nutritional and metabolic disease: Secondary | ICD-10-CM

## 2011-08-21 DIAGNOSIS — J40 Bronchitis, not specified as acute or chronic: Secondary | ICD-10-CM

## 2011-08-21 DIAGNOSIS — N320 Bladder-neck obstruction: Secondary | ICD-10-CM | POA: Diagnosis present

## 2011-08-21 DIAGNOSIS — I4891 Unspecified atrial fibrillation: Secondary | ICD-10-CM

## 2011-08-21 HISTORY — DX: Unspecified atrial fibrillation: I48.91

## 2011-08-21 LAB — BASIC METABOLIC PANEL
CO2: 28 mEq/L (ref 19–32)
Chloride: 107 mEq/L (ref 96–112)
Glucose, Bld: 132 mg/dL — ABNORMAL HIGH (ref 70–99)
Sodium: 142 mEq/L (ref 135–145)

## 2011-08-21 LAB — URINALYSIS, ROUTINE W REFLEX MICROSCOPIC
Glucose, UA: 250 mg/dL — AB
Ketones, ur: 15 mg/dL — AB
Nitrite: POSITIVE — AB
Specific Gravity, Urine: 1.025 (ref 1.005–1.030)
pH: 7 (ref 5.0–8.0)

## 2011-08-21 LAB — CBC
Hemoglobin: 10.9 g/dL — ABNORMAL LOW (ref 13.0–17.0)
MCH: 28.5 pg (ref 26.0–34.0)
Platelets: 296 10*3/uL (ref 150–400)
RBC: 3.83 MIL/uL — ABNORMAL LOW (ref 4.22–5.81)
WBC: 6.6 10*3/uL (ref 4.0–10.5)

## 2011-08-21 LAB — CARDIAC PANEL(CRET KIN+CKTOT+MB+TROPI)
CK, MB: 6.4 ng/mL (ref 0.3–4.0)
Total CK: 107 U/L (ref 7–232)

## 2011-08-21 LAB — URINE MICROSCOPIC-ADD ON

## 2011-08-21 LAB — TROPONIN I: Troponin I: <0.3 ng/mL (ref <0.30)

## 2011-08-21 SURGERY — TURBT (TRANSURETHRAL RESECTION OF BLADDER TUMOR)
Anesthesia: Choice

## 2011-08-21 MED ORDER — OXYCODONE HCL 10 MG PO TB12
10.0000 mg | ORAL_TABLET | Freq: Two times a day (BID) | ORAL | Status: DC
  Administered 2011-08-21 – 2011-08-26 (×8): 10 mg via ORAL
  Filled 2011-08-21: qty 1
  Filled 2011-08-21: qty 2
  Filled 2011-08-21 (×7): qty 1

## 2011-08-21 MED ORDER — MORPHINE SULFATE 2 MG/ML IJ SOLN
2.0000 mg | INTRAMUSCULAR | Status: DC | PRN
  Administered 2011-08-22 (×3): 2 mg via INTRAVENOUS
  Filled 2011-08-21 (×3): qty 1

## 2011-08-21 MED ORDER — ALBUTEROL SULFATE (5 MG/ML) 0.5% IN NEBU
INHALATION_SOLUTION | RESPIRATORY_TRACT | Status: AC
  Administered 2011-08-21: 2.5 mg
  Filled 2011-08-21: qty 1

## 2011-08-21 MED ORDER — POTASSIUM CHLORIDE CRYS ER 20 MEQ PO TBCR
20.0000 meq | EXTENDED_RELEASE_TABLET | Freq: Two times a day (BID) | ORAL | Status: DC
  Administered 2011-08-21: 20 meq via ORAL
  Filled 2011-08-21: qty 1

## 2011-08-21 MED ORDER — MORPHINE SULFATE 2 MG/ML IJ SOLN
2.0000 mg | Freq: Once | INTRAMUSCULAR | Status: AC
  Administered 2011-08-21: 2 mg via INTRAVENOUS

## 2011-08-21 MED ORDER — MOMETASONE FUROATE 0.1 % EX CREA
1.0000 "application " | TOPICAL_CREAM | Freq: Every day | CUTANEOUS | Status: DC | PRN
  Filled 2011-08-21: qty 15

## 2011-08-21 MED ORDER — DILTIAZEM HCL 100 MG IV SOLR
5.0000 mg/h | Freq: Once | INTRAVENOUS | Status: AC
  Administered 2011-08-21: 5 mg/h via INTRAVENOUS
  Filled 2011-08-21: qty 100

## 2011-08-21 MED ORDER — HYDROMORPHONE HCL PF 1 MG/ML IJ SOLN
2.0000 mg | Freq: Once | INTRAMUSCULAR | Status: AC
  Administered 2011-08-21: 2 mg via INTRAVENOUS

## 2011-08-21 MED ORDER — DILTIAZEM HCL 25 MG/5ML IV SOLN
10.0000 mg | Freq: Once | INTRAVENOUS | Status: AC
  Administered 2011-08-21: 10 mg via INTRAVENOUS

## 2011-08-21 MED ORDER — LISINOPRIL-HYDROCHLOROTHIAZIDE 20-25 MG PO TABS: 2.0000 | ORAL_TABLET | Freq: Every day | ORAL | Status: DC

## 2011-08-21 MED ORDER — ONDANSETRON HCL 4 MG/2ML IJ SOLN
4.0000 mg | Freq: Four times a day (QID) | INTRAMUSCULAR | Status: DC | PRN
  Administered 2011-08-25: 4 mg via INTRAVENOUS
  Filled 2011-08-21: qty 2

## 2011-08-21 MED ORDER — METOPROLOL TARTRATE 1 MG/ML IV SOLN
INTRAVENOUS | Status: AC
  Filled 2011-08-21: qty 5

## 2011-08-21 MED ORDER — ALBUTEROL SULFATE HFA 108 (90 BASE) MCG/ACT IN AERS
2.0000 | INHALATION_SPRAY | Freq: Four times a day (QID) | RESPIRATORY_TRACT | Status: DC | PRN
  Filled 2011-08-21: qty 6.7

## 2011-08-21 MED ORDER — VENLAFAXINE HCL 37.5 MG PO TABS
75.0000 mg | ORAL_TABLET | Freq: Two times a day (BID) | ORAL | Status: DC
  Administered 2011-08-21 – 2011-08-26 (×10): 75 mg via ORAL
  Filled 2011-08-21 (×2): qty 1
  Filled 2011-08-21 (×7): qty 2
  Filled 2011-08-21: qty 1
  Filled 2011-08-21 (×4): qty 2

## 2011-08-21 MED ORDER — ONDANSETRON HCL 4 MG PO TABS: 4.0000 mg | ORAL_TABLET | Freq: Four times a day (QID) | ORAL | Status: DC | PRN

## 2011-08-21 MED ORDER — IPRATROPIUM BROMIDE 0.02 % IN SOLN
RESPIRATORY_TRACT | Status: AC
  Administered 2011-08-21: 0.5 mg via RESPIRATORY_TRACT
  Filled 2011-08-21: qty 2.5

## 2011-08-21 MED ORDER — DILTIAZEM HCL 100 MG IV SOLR: 10.0000 mg/h | Freq: Once | INTRAVENOUS | Status: DC

## 2011-08-21 MED ORDER — DOCUSATE SODIUM 100 MG PO CAPS
100.0000 mg | ORAL_CAPSULE | ORAL | Status: DC | PRN
  Filled 2011-08-21 (×2): qty 1

## 2011-08-21 MED ORDER — MUPIROCIN 2 % EX OINT
1.0000 "application " | TOPICAL_OINTMENT | Freq: Two times a day (BID) | CUTANEOUS | Status: AC
  Administered 2011-08-21 – 2011-08-26 (×10): 1 via NASAL
  Filled 2011-08-21 (×2): qty 22

## 2011-08-21 MED ORDER — ALPRAZOLAM 1 MG PO TABS
1.0000 mg | ORAL_TABLET | Freq: Every evening | ORAL | Status: DC | PRN
  Administered 2011-08-21 – 2011-08-25 (×7): 1 mg via ORAL
  Filled 2011-08-21: qty 2
  Filled 2011-08-21: qty 1
  Filled 2011-08-21: qty 2
  Filled 2011-08-21: qty 1
  Filled 2011-08-21 (×5): qty 2

## 2011-08-21 MED ORDER — DILTIAZEM HCL 25 MG/5ML IV SOLN
10.0000 mg | Freq: Once | INTRAVENOUS | Status: AC
  Administered 2011-08-21: 10 mg via INTRAVENOUS
  Filled 2011-08-21: qty 5

## 2011-08-21 MED ORDER — OXYCODONE-ACETAMINOPHEN 5-325 MG PO TABS
2.0000 | ORAL_TABLET | Freq: Once | ORAL | Status: AC
  Administered 2011-08-21: 2 via ORAL
  Filled 2011-08-21: qty 2

## 2011-08-21 MED ORDER — IPRATROPIUM BROMIDE 0.02 % IN SOLN
0.5000 mg | Freq: Once | RESPIRATORY_TRACT | Status: AC
  Administered 2011-08-21: 0.5 mg via RESPIRATORY_TRACT
  Filled 2011-08-21: qty 2.5

## 2011-08-21 MED ORDER — HYDROMORPHONE HCL PF 1 MG/ML IJ SOLN
INTRAMUSCULAR | Status: AC
  Filled 2011-08-21: qty 1

## 2011-08-21 MED ORDER — DEXTROSE 5 % IV SOLN
1.0000 g | Freq: Once | INTRAVENOUS | Status: AC
  Administered 2011-08-21: 1 g via INTRAVENOUS
  Filled 2011-08-21: qty 10

## 2011-08-21 MED ORDER — CHLORHEXIDINE GLUCONATE CLOTH 2 % EX PADS
6.0000 | MEDICATED_PAD | Freq: Every day | CUTANEOUS | Status: DC
  Administered 2011-08-22 – 2011-08-25 (×4): 6 via TOPICAL

## 2011-08-21 MED ORDER — DILTIAZEM HCL 100 MG IV SOLR
5.0000 mg/h | INTRAVENOUS | Status: DC
  Administered 2011-08-21 – 2011-08-23 (×4): 15 mg/h via INTRAVENOUS
  Filled 2011-08-21: qty 100

## 2011-08-21 MED ORDER — DEXTROSE 5 % IV SOLN
500.0000 mg | Freq: Once | INTRAVENOUS | Status: AC
  Administered 2011-08-21: 500 mg via INTRAVENOUS
  Filled 2011-08-21: qty 500

## 2011-08-21 MED ORDER — HEPARIN SODIUM (PORCINE) 5000 UNIT/ML IJ SOLN
5000.0000 [IU] | Freq: Three times a day (TID) | INTRAMUSCULAR | Status: DC
  Administered 2011-08-21 – 2011-08-25 (×10): 5000 [IU] via SUBCUTANEOUS
  Filled 2011-08-21 (×17): qty 1

## 2011-08-21 MED ORDER — SODIUM CHLORIDE 0.9 % IJ SOLN
3.0000 mL | Freq: Two times a day (BID) | INTRAMUSCULAR | Status: DC
  Administered 2011-08-21 – 2011-08-25 (×8): 3 mL via INTRAVENOUS
  Filled 2011-08-21 (×6): qty 3

## 2011-08-21 MED ORDER — HYDROCODONE-ACETAMINOPHEN 5-325 MG PO TABS
1.0000 | ORAL_TABLET | ORAL | Status: DC | PRN
  Administered 2011-08-22 – 2011-08-24 (×5): 1 via ORAL
  Filled 2011-08-21 (×5): qty 1

## 2011-08-21 MED ORDER — ALBUTEROL SULFATE (5 MG/ML) 0.5% IN NEBU
5.0000 mg | INHALATION_SOLUTION | Freq: Once | RESPIRATORY_TRACT | Status: AC
  Administered 2011-08-21: 5 mg via RESPIRATORY_TRACT
  Filled 2011-08-21: qty 1

## 2011-08-21 MED ORDER — IMATINIB MESYLATE 100 MG PO TABS
400.0000 mg | ORAL_TABLET | Freq: Every evening | ORAL | Status: DC
  Administered 2011-08-22 – 2011-08-25 (×4): 400 mg via ORAL
  Filled 2011-08-21 (×6): qty 4

## 2011-08-21 MED ORDER — FUROSEMIDE 10 MG/ML IJ SOLN
40.0000 mg | Freq: Two times a day (BID) | INTRAMUSCULAR | Status: DC
  Administered 2011-08-21 – 2011-08-24 (×6): 40 mg via INTRAVENOUS
  Filled 2011-08-21 (×6): qty 4

## 2011-08-21 MED ORDER — MORPHINE SULFATE 4 MG/ML IJ SOLN
INTRAMUSCULAR | Status: AC
  Filled 2011-08-21: qty 1

## 2011-08-21 MED ORDER — FLUTICASONE-SALMETEROL 250-50 MCG/DOSE IN AEPB
1.0000 | INHALATION_SPRAY | Freq: Two times a day (BID) | RESPIRATORY_TRACT | Status: DC
  Administered 2011-08-21 – 2011-08-26 (×9): 1 via RESPIRATORY_TRACT
  Filled 2011-08-21: qty 14

## 2011-08-21 MED ORDER — CITALOPRAM HYDROBROMIDE 20 MG PO TABS
40.0000 mg | ORAL_TABLET | Freq: Every day | ORAL | Status: DC
  Administered 2011-08-22 – 2011-08-26 (×5): 40 mg via ORAL
  Filled 2011-08-21 (×6): qty 2

## 2011-08-21 SURGICAL SUPPLY — 24 items
BAG DECANTER FOR FLEXI CONT (MISCELLANEOUS) ×2 IMPLANT
BAG DRAIN URO TABLE W/ADPT NS (DRAPE) ×2 IMPLANT
BAG URINE DRAINAGE (UROLOGICAL SUPPLIES) ×2 IMPLANT
CABLE HI FREQUENCY MONOPOLAR (ELECTROSURGICAL) ×2 IMPLANT
CATH FOLEY 2WAY SLVR  5CC 20FR (CATHETERS) ×1
CATH FOLEY 2WAY SLVR 5CC 20FR (CATHETERS) ×1 IMPLANT
CLOTH BEACON ORANGE TIMEOUT ST (SAFETY) ×2 IMPLANT
CONNECTOR 5 IN 1 STRAIGHT STRL (MISCELLANEOUS) ×2 IMPLANT
ELECT CUT LOOP C-MAX 27FR .012 (CUTTING LOOP) ×2
ELECTRODE CUT LP CMX 27FR .012 (CUTTING LOOP) ×1 IMPLANT
FORMALIN 10 PREFIL 120ML (MISCELLANEOUS) IMPLANT
GLOVE BIO SURGEON STRL SZ7 (GLOVE) ×2 IMPLANT
GLYCINE 1.5% IRRIG UROMATIC (IV SOLUTION) ×6 IMPLANT
GOWN STRL REIN XL XLG (GOWN DISPOSABLE) ×2 IMPLANT
IV NS IRRIG 3000ML ARTHROMATIC (IV SOLUTION) ×2 IMPLANT
KIT ROOM TURNOVER AP CYSTO (KITS) ×2 IMPLANT
MANIFOLD NEPTUNE II (INSTRUMENTS) ×2 IMPLANT
PACK CYSTO (CUSTOM PROCEDURE TRAY) ×2 IMPLANT
PAD ARMBOARD 7.5X6 YLW CONV (MISCELLANEOUS) ×2 IMPLANT
PAD TELFA 3X4 1S STER (GAUZE/BANDAGES/DRESSINGS) IMPLANT
SET IRRIGATING DISP (SET/KITS/TRAYS/PACK) ×2 IMPLANT
SYR 30ML LL (SYRINGE) ×2 IMPLANT
TOWEL OR 17X26 4PK STRL BLUE (TOWEL DISPOSABLE) ×2 IMPLANT
YANKAUER SUCT BULB TIP 10FT TU (MISCELLANEOUS) ×2 IMPLANT

## 2011-08-21 NOTE — H&P (Signed)
Mitchell [name] MRN: [id number] DOB/AGE: 1945/12/14 66 y.o. Primary Care Physician:HASANAJ,XAJE A, MD, MD Admit date: 08/21/2011 Chief Complaint: Dyspnea, palpitations and chest pain. HPI: This 66 year old man has the above symptoms since this morning. He was due to come into have a cystoscopy with possibly TUR of the bladder neck. He has been having painless hematuria. Unfortunately, prior to the procedure he started to have the above symptoms which prompted his visit to the emergency room. In the emergency room he was found to have atrial fibrillation with rapid ventricular response and an abnormal looking chest x-ray. He denies any cough or fever during the last week or so.  Past Medical History  Diagnosis Date  . COPD (chronic obstructive pulmonary disease)   . Cancer   . Prostate cancer   . Colon cancer   . Leukemia   . Hypertension   . Pneumonia   . Leukemia    Past Surgical History  Procedure Date  . Colon surgery   . Prostate surgery         No family history on file.  Social History:  reports that he has been smoking.  He does not have any smokeless tobacco history on file. He reports that he does not drink alcohol or use illicit drugs. He is married and lives with his wife.  Allergies: No Known Allergies       JYN:WGNFA from the symptoms mentioned above,there are no other symptoms referable to all systems reviewed.  Physical Exam: Blood pressure 123/86, pulse 121, temperature 98 F (36.7 C), temperature source Oral, resp. rate 24, weight 99.791 kg (220 lb), SpO2 90.00%. He does look systemically well. Is not toxic or septic. There is slight increased work of breathing at rest. Heart sounds are present and in atrial fibrillation with a ventricular rate of 130. Lung fields show bilateral inspiratory crackles in the lower and mid zone scattered. There is no bronchial breathing. Jugular venous pressure not elevated. There is peripheral pitting edema of his legs from  his knee downwards. Abdomen is soft and nontender with no evidence of hepatosplenomegaly. There are no masses felt. He is alert and orientated without any focal neurological signs.    Basename 08/21/11 1113  WBC 6.6  NEUTROABS --  HGB 10.9*  HCT 35.7*  MCV 93.2  PLT 296    Basename 08/21/11 1113  NA 142  K 3.7  CL 107  CO2 28  GLUCOSE 132*  BUN 16  CREATININE 0.91  CALCIUM 9.5  MG --         Dg Chest Portable 1 View  08/21/2011  *RADIOLOGY REPORT*  Clinical Data: Shortness of breath.  History of prostate and colon cancer.  PORTABLE CHEST - 1 VIEW  Comparison: PA and lateral chest 01/30/2011.  CT chest 04/13/2009.  Findings: There is marked cardiomegaly.  Lungs are emphysematous. Bibasilar airspace disease is present and worse on the left.  IMPRESSION: Cardiomegaly left worse right basilar airspace disease which could reflect asymmetric edema or pneumonia.  Original Report Authenticated By: Bernadene Bell. Maricela Curet, M.D.   Impression: 1. Congestive heart failure. 2. Atrial fibrillation with rapid ventricular response. 3. History prostate cancer status post radical prostatectomy several years ago at [facility] and now painless hematuria, due to have cystoscopy and TUR bladder neck by Dr. Jerre Simon. 4. Hypertension. 5. CLL. 6. Obesity.     Plan: 1. Admit to step down unit. 2. Cardizem drip. 3. Intravenous diuretics. 4. Echocardiogram. Serial cardiac enzymes. 5. Cardiology consultation. Further recommendations will  depend on patient's hospital progress.      Wilson Singer Pager [phone]  08/21/2011, 3:22 PM

## 2011-08-21 NOTE — OR Nursing (Signed)
Pt. Arrived to waiting area with chest pain and sob, pale and

## 2011-08-21 NOTE — ED Notes (Signed)
Pt up to [facility] to have bm

## 2011-08-21 NOTE — H&P (Signed)
NAME:  Mitchell [name], Mitchell [name] NO.:  [id number]  MEDICAL RECORD NO.:  [id number]  LOCATION:                                 FACILITY:  PHYSICIAN:  Ky Barban, M.D.DATE OF BIRTH:  Jul 06, 1945  DATE OF ADMISSION:  08/21/2011 DATE OF DISCHARGE:  LH                             HISTORY & PHYSICAL   CHIEF COMPLAINT:  Prostate cancer, difficulty to void, gross hematuria.  HISTORY:  A 66 year old gentleman, who is well known to me.  He has prostate cancer, underwent a radical prostatectomy several years ago in [facility], and I have seen him in 2008 with urinary retention and underwent TUR of his bladder neck.  I have not seen him frequently in the office recently.  I happened to see him again in [facility] because he was having breathing problems, so he was in ICU and I was able to put a Foley catheter, and now I am seeing him again today because he is having gross total painless hematuria.  No fever or chills.  I cystoscoped him in the office.  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  Maybe there is some growth in his bladder.  I am not show yet until I look inside the bladder.  He understands.  He is coming as outpatient tomorrow.  We will do cysto, TUR of his bladder neck.  PAST MEDICAL HISTORY:  He has multiple other problems to include COPD, he uses oxygen at home.  Also has hypertension.  No diabetes.  FAMILY HISTORY:  Father had prostate cancer.  PERSONAL HISTORY:  He does not smoke or drink.  REVIEW OF SYSTEMS:  Unremarkable.  PHYSICAL EXAMINATION:  GENERAL:  Moderately obese, not in acute distress, fully conscious, alert and oriented. VITAL SIGNS:  Blood pressure 130/80, temperature is normal. CENTRAL NERVOUS SYSTEM:  No gross neurological deficit. HEAD, NECK, EYES, ENT:   Negative. CHEST:  Symmetrical.  Normal breath sounds. HEART:  Regular sinus rhythm.  No murmur. ABDOMEN:  Soft, flat.  Liver, spleen, and kidneys are not palpable. EXTERNAL GENITALIA:  Unremarkable. RECTAL:  Deferred.  IMPRESSION: 1. Prostate cancer by history, status post radical retropubic     prostatectomy. 2. Gross hematuria. 3. Bladder neck stenosis. 4. Chronic obstructive pulmonary disease.  LIST OF MEDICATION: 1. Citalopram. 2. Venlafaxine 75 mg twice daily. 3. Xarelto 20 mg 1 daily.  This is a blood thinner. 4. Metoprolol ER 25 mg once a day. 5. Theophylline 300 mg twice a day. 6. Gleevec 400 mg once a day. 7. Alprazolam 1 tablet 3 times a day. 8. OxyContin 40 mg every 12 hours. 9. Oxycodone 5 mg for breakthrough. 10.Albuterol inhaler. 11.Advair Diskus nebulizer with albuterol oxygen 2.5 L. 12.Potassium chloride 10 mEq 6 times daily.     Ky Barban, M.D.     MIJ/MEDQ  D:  08/20/2011  T:  08/20/2011  Job:  [id number]

## 2011-08-21 NOTE — ED Notes (Signed)
Dr. Karilyn Cota aware of pt medications for blood thinner and hematuria

## 2011-08-21 NOTE — ED Notes (Signed)
Report attempted. Unable to take report at this time. Will call back when available

## 2011-08-21 NOTE — ED Notes (Signed)
Pt rate uncontrolled with cardizem 5mg /hr. New order for 10mg  IV cardizem bolus followed by increase to 10mg /hr

## 2011-08-21 NOTE — ED Notes (Signed)
Pt requesting pain medication. Dr. Cook aware.  

## 2011-08-21 NOTE — ED Notes (Signed)
Dr. Alphonzo Grieve to see pt for admission.

## 2011-08-21 NOTE — Consult Note (Signed)
[phone]

## 2011-08-21 NOTE — OR Nursing (Signed)
Arrived to waiting area with complaints with sob and chest pain. Vitals obtained and Dr. Jayme Cloud notified.  O2 @ 2.5 liters started and changed to non renreather.  IV started by [facility] RN    Metoprolol   toal of 5mg  given in divided doses. Report to ED,   Spoke with Verlon Au.  Pt. Transferred to ED via stretcher and monitir O2. Report given to [facility]. Wife at  Pt. Bedside.

## 2011-08-21 NOTE — ED Notes (Signed)
Pt voided bright red blood. Dr. Adriana Simas aware and aware of pt current medications.

## 2011-08-21 NOTE — ED Notes (Signed)
Pt requesting xanax, medicated as ordered.

## 2011-08-21 NOTE — ED Notes (Signed)
Pt states after breathing treatment " I feel better" breathing not as labored. Speaks complete sentences. Pt wears home O2 at 2.5L. Respiratory in room and pt changed from non-rebreather to [place] at 2.5L.

## 2011-08-21 NOTE — Progress Notes (Addendum)
Pt arrived from ER on stretcher. Pt moved over to ICU bed. Pt very anxious sitting up on side of bed trying to use urinal, saying he is hurting. Pt unable to sit still. Pt pointed to his lower abd and stated it felt like his bladder was full. Bladder scan found >650 cc urine. MD paged and made aware. Order received to give 2 mg of IV Morphine x1 now and to insert foley.   Unsuccessful attempt to insert foley. Met resistance. MD paged and made aware. Order received to consult Dr. Frann Rider for foley placement.  Dr. Frann Rider called and made aware of need to foley. MD stated he was finished up at office and would come to place foley.

## 2011-08-21 NOTE — ED Notes (Addendum)
Pt came to one day surgery at 10 am for TURP procedure and began to have cp and sob 15 mins pta to ER. Pt SOB upon arrival speaking short phrases and chest tightness. Dr. Adriana Simas in room with pt at present time. Pt given 5mg  IV Metoprolol at One day for HR in 140s

## 2011-08-21 NOTE — ED Provider Notes (Signed)
History    This chart was scribed for Donnetta Hutching, MD, MD by Smitty Pluck. The patient was seen in room APA06 and the patient's care was started at 11:00AM.   CSN: [id number]  Arrival date & time 08/21/11  1059   None     Chief Complaint  Patient presents with  . Shortness of Breath  . Chest Pain    (Consider location/radiation/quality/duration/timing/severity/associated sxs/prior treatment) Patient is a 66 y.o. male presenting with shortness of breath and chest pain. The history is provided by a relative.  Shortness of Breath   Chest Pain   Level 5 caveat  Mitchell [name] is a 66 y.o. male who presents to the Emergency Department complaining of moderate SOB and mild chest pain onset today. Pt came to day surgery 1.5 hours ago for TURP procedure when symptoms suddenly started. Pt describes the chest pain as "tightness." the symptoms have been constant since onset without radiation. Pt has atrial fib and was wheezing upon arrival to ED. Pt has had similar symptoms in the past due to atrial fib. Pt uses albuterol. Pt has atrial fib dx 6 weeks ago and is scheduled for  cardiologist appointment on April 30th . 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  PCP Dr. Olena Leatherwood Cardiologist Dr. Maurine Cane and Dr. Mayford Knife [facility]   Past Medical History  Diagnosis Date  . COPD (chronic obstructive pulmonary disease)   . Cancer   . Prostate cancer   . Colon cancer   . Leukemia   . Hypertension   . Pneumonia   . Leukemia     Past Surgical History  Procedure Date  . Colon surgery   . Prostate surgery     No family history on file.  History  Substance Use Topics  . Smoking status: Current Everyday Smoker -- 0.5 packs/day  . Smokeless tobacco: Not on file  . Alcohol Use: No      Review of Systems  Unable to perform ROS: Other     Allergies  Review of patient's allergies indicates no known allergies.  Home Medications   Current Outpatient  Rx  Name Route Sig Dispense Refill  . ALBUTEROL SULFATE HFA 108 (90 BASE) MCG/ACT IN AERS Inhalation Inhale 2 puffs into the lungs every 6 (six) hours as needed. For shortness of breath     . ALPRAZOLAM 1 MG PO TABS Oral Take 1 mg by mouth at bedtime as needed. For sleep     . CITALOPRAM HYDROBROMIDE 40 MG PO TABS Oral Take 40 mg by mouth daily.      Marland Kitchen DOCUSATE SODIUM 100 MG PO CAPS Oral Take 100 mg by mouth as needed. For constipation     . FLUTICASONE-SALMETEROL 250-50 MCG/DOSE IN AEPB Inhalation Inhale 1 puff into the lungs every 12 (twelve) hours.      Marland Kitchen HYDROCODONE-ACETAMINOPHEN 10-500 MG PO TABS Oral Take 1 tablet by mouth 3 (three) times daily as needed. For pain    . IMATINIB MESYLATE 400 MG PO TABS Oral Take 400 mg by mouth every evening. Take with meals and large glass of water.Caution:Chemotherapy.     . LUPRON IJ Injection Inject 1 each as directed every 4 (four) months.      Marland Kitchen LISINOPRIL-HYDROCHLOROTHIAZIDE 20-25 MG PO TABS Oral Take 2 tablets by mouth daily.      . MOMETASONE FUROATE 0.1 % EX CREA Topical Apply 1 application topically daily as needed. Skin lesions     .  OXYCODONE HCL ER 10 MG PO TB12 Oral Take 10 mg by mouth every 12 (twelve) hours.      Marland Kitchen POTASSIUM CHLORIDE 10 MEQ PO TBCR Oral Take 30 mEq by mouth daily.      . THEOPHYLLINE ER 300 MG PO CP24 Oral Take 300 mg by mouth 2 (two) times daily.      . THEOPHYLLINE PO Oral Take 1 tablet by mouth 2 (two) times daily.     . VENLAFAXINE HCL 75 MG PO TABS Oral Take 75 mg by mouth 2 (two) times daily.      . EFFEXOR PO Oral Take 1 tablet by mouth 2 (two) times daily.        BP 112/87  Pulse 115  Temp(Src) 98 F (36.7 C) (Oral)  Resp 24  Wt 220 lb (99.791 kg)  SpO2 97%  Physical Exam  Nursing note and vitals reviewed. Constitutional: He is oriented to person, place, and time. He appears well-developed and well-nourished. No distress.  HENT:  Head: Normocephalic and atraumatic.  Eyes: Conjunctivae are normal. Pupils  are equal, round, and reactive to light.  Neck: Normal range of motion. No thyromegaly present.  Cardiovascular: Tachycardia present.        Atrial fib  Pulmonary/Chest: He has wheezes (bilaterally).       Dyspnea   Neurological: He is alert and oriented to person, place, and time.  Skin: Skin is warm and dry.  Psychiatric: He has a normal mood and affect. His behavior is normal.    ED Course  Procedures (including critical care time) DIAGNOSTIC STUDIES: Oxygen Saturation is 97% on , normal by my interpretation.    COORDINATION OF CARE: 11:10AM EDP discusses pt ED treatment with pt: breathing treatment albuterol, bipap, cardizem medication to bring heart rate down 11:15AM EDP orders medication: cadizem 10 mg, atrovent, albuterol 11:16PM Recheck: Pt's condition has improved after initial treatment, breathing has improved, skin color improved 12:10PM Recheck: p  Labs Reviewed  CBC - Abnormal; Notable for the following:    RBC 3.83 (*)    Hemoglobin 10.9 (*)    HCT 35.7 (*)    All other components within normal limits  BASIC METABOLIC PANEL  TROPONIN I   Dg Chest Portable 1 View  08/21/2011  *RADIOLOGY REPORT*  Clinical Data: Shortness of breath.  History of prostate and colon cancer.  PORTABLE CHEST - 1 VIEW  Comparison: PA and lateral chest 01/30/2011.  CT chest 04/13/2009.  Findings: There is marked cardiomegaly.  Lungs are emphysematous. Bibasilar airspace disease is present and worse on the left.  IMPRESSION: Cardiomegaly left worse right basilar airspace disease which could reflect asymmetric edema or pneumonia.  Original Report Authenticated By: Bernadene Bell. Maricela Curet, M.D.     No diagnosis found.   Date: 08/21/2011  Rate: 131  Rhythm: atrial fibrillation  QRS Axis: right  Intervals: normal  ST/T Wave abnormalities: normal  Conduction Disutrbances:none  Narrative Interpretation:   Old EKG Reviewed: none available CRITICAL CARE Performed by: Donnetta Hutching   Total  critical care time: 60  Critical care time was exclusive of separately billable procedures and treating other patients.  Critical care was necessary to treat or prevent imminent or life-threatening deterioration.  Critical care was time spent personally by me on the following activities: development of treatment plan with patient and/or surrogate as well as nursing, discussions with consultants, evaluation of patient's response to treatment, examination of patient, obtaining history from patient or surrogate, ordering and performing treatments and interventions, ordering  and review of laboratory studies, ordering and review of radiographic studies, pulse oximetry and re-evaluation of patient's condition.  MDM  Patient is very complex. He was scheduled for a TURP this morning.  He became dyspneic, wheezing, tachycardic. In ED patient had notable wheezes, pulse was greater than 130 and rhythm was atrial fibrillation. Multiple breathing treatments given. Cardizem drip started for rapid A. Fib.   IV antibiotics administered for probable pneumonia.  Patient showed dramatic improvement in ED. Will admit  I personally performed the services described in this documentation, which was scribed in my presence. The recorded information has been reviewed and considered.        Donnetta Hutching, MD 08/21/11 [phone]

## 2011-08-21 NOTE — ED Notes (Signed)
Dr. Alphonzo Grieve here to see pt.

## 2011-08-22 ENCOUNTER — Encounter (HOSPITAL_COMMUNITY): Payer: Self-pay | Admitting: Adult Health

## 2011-08-22 ENCOUNTER — Inpatient Hospital Stay (HOSPITAL_COMMUNITY): Payer: Medicare Other

## 2011-08-22 DIAGNOSIS — I4891 Unspecified atrial fibrillation: Secondary | ICD-10-CM

## 2011-08-22 DIAGNOSIS — I509 Heart failure, unspecified: Secondary | ICD-10-CM

## 2011-08-22 DIAGNOSIS — I319 Disease of pericardium, unspecified: Secondary | ICD-10-CM

## 2011-08-22 DIAGNOSIS — I10 Essential (primary) hypertension: Secondary | ICD-10-CM

## 2011-08-22 LAB — URINE CULTURE: Colony Count: NO GROWTH

## 2011-08-22 LAB — COMPREHENSIVE METABOLIC PANEL
AST: 20 U/L (ref 0–37)
Albumin: 3.5 g/dL (ref 3.5–5.2)
Alkaline Phosphatase: 73 U/L (ref 39–117)
Chloride: 102 mEq/L (ref 96–112)
Potassium: 3.1 mEq/L — ABNORMAL LOW (ref 3.5–5.1)
Total Bilirubin: 0.5 mg/dL (ref 0.3–1.2)

## 2011-08-22 LAB — CARDIAC PANEL(CRET KIN+CKTOT+MB+TROPI)
CK, MB: 4.4 ng/mL — ABNORMAL HIGH (ref 0.3–4.0)
CK, MB: 4.3 ng/mL — ABNORMAL HIGH (ref 0.3–4.0)
Relative Index: INVALID (ref 0.0–2.5)
Total CK: 60 U/L (ref 7–232)
Troponin I: <0.3 ng/mL (ref <0.30)

## 2011-08-22 LAB — CBC
MCH: 28.9 pg (ref 26.0–34.0)
MCV: 92.8 fL (ref 78.0–100.0)
Platelets: 270 10*3/uL (ref 150–400)
RDW: 14.9 % (ref 11.5–15.5)

## 2011-08-22 LAB — TSH: TSH: 0.372 u[IU]/mL (ref 0.350–4.500)

## 2011-08-22 MED ORDER — LEVALBUTEROL HCL 0.63 MG/3ML IN NEBU
INHALATION_SOLUTION | RESPIRATORY_TRACT | Status: AC
  Filled 2011-08-22: qty 3

## 2011-08-22 MED ORDER — LEVALBUTEROL HCL 0.63 MG/3ML IN NEBU
0.6300 mg | INHALATION_SOLUTION | Freq: Four times a day (QID) | RESPIRATORY_TRACT | Status: DC
  Administered 2011-08-22 – 2011-08-26 (×14): 0.63 mg via RESPIRATORY_TRACT
  Filled 2011-08-22 (×14): qty 3

## 2011-08-22 MED ORDER — NITROGLYCERIN 0.4 MG SL SUBL
0.4000 mg | SUBLINGUAL_TABLET | SUBLINGUAL | Status: DC | PRN
  Administered 2011-08-22 (×2): 0.4 mg via SUBLINGUAL

## 2011-08-22 MED ORDER — ALBUTEROL SULFATE (5 MG/ML) 0.5% IN NEBU
INHALATION_SOLUTION | RESPIRATORY_TRACT | Status: AC
  Filled 2011-08-22: qty 0.5

## 2011-08-22 MED ORDER — POTASSIUM CHLORIDE CRYS ER 20 MEQ PO TBCR: 40.0000 meq | EXTENDED_RELEASE_TABLET | Freq: Once | ORAL | Status: AC

## 2011-08-22 MED ORDER — ALBUTEROL SULFATE (5 MG/ML) 0.5% IN NEBU
2.5000 mg | INHALATION_SOLUTION | RESPIRATORY_TRACT | Status: DC | PRN
  Administered 2011-08-22: 2.5 mg via RESPIRATORY_TRACT
  Filled 2011-08-22: qty 0.5

## 2011-08-22 MED ORDER — DIGOXIN 250 MCG PO TABS
0.2500 mg | ORAL_TABLET | Freq: Every day | ORAL | Status: DC
  Administered 2011-08-23 – 2011-08-26 (×4): 0.25 mg via ORAL
  Filled 2011-08-22 (×4): qty 1

## 2011-08-22 MED ORDER — MORPHINE SULFATE 2 MG/ML IJ SOLN: 2.0000 mg | Freq: Once | INTRAMUSCULAR | Status: AC

## 2011-08-22 MED ORDER — POTASSIUM CHLORIDE CRYS ER 20 MEQ PO TBCR
40.0000 meq | EXTENDED_RELEASE_TABLET | Freq: Two times a day (BID) | ORAL | Status: DC
  Administered 2011-08-22 – 2011-08-26 (×9): 40 meq via ORAL
  Filled 2011-08-22 (×10): qty 2

## 2011-08-22 MED ORDER — SODIUM CHLORIDE 0.9 % IJ SOLN
INTRAMUSCULAR | Status: AC
  Filled 2011-08-22: qty 6

## 2011-08-22 MED ORDER — METOPROLOL SUCCINATE ER 25 MG PO TB24
25.0000 mg | ORAL_TABLET | Freq: Every day | ORAL | Status: DC
  Administered 2011-08-22 – 2011-08-26 (×5): 25 mg via ORAL
  Filled 2011-08-22 (×5): qty 1

## 2011-08-22 MED ORDER — DIGOXIN 0.25 MG/ML IJ SOLN
0.2500 mg | INTRAMUSCULAR | Status: AC
  Administered 2011-08-22 (×2): 0.25 mg via INTRAVENOUS
  Filled 2011-08-22 (×2): qty 2

## 2011-08-22 MED ORDER — NITROGLYCERIN 0.4 MG SL SUBL
SUBLINGUAL_TABLET | SUBLINGUAL | Status: AC
  Administered 2011-08-22: 14:00:00
  Filled 2011-08-22: qty 25

## 2011-08-22 MED ORDER — BIOTENE DRY MOUTH MT LIQD
15.0000 mL | Freq: Two times a day (BID) | OROMUCOSAL | Status: DC
  Administered 2011-08-22 – 2011-08-25 (×7): 15 mL via OROMUCOSAL

## 2011-08-22 NOTE — Progress Notes (Signed)
Subjective: This man was admitted yesterday with atrial fibrillation with rapid ventricular response as well as congestive heart failure clinically and radiologically. He has improved and he has had a reasonable diuresis. There was an issue regarding urinary retention and Dr. Jerre Simon and had to come in yesterday to facilitate insertion of a Foley catheter. His serial cardiac enzymes are negative. His BNP was elevated at 4869.           Physical Exam: Blood pressure 160/95, pulse 108, temperature 97.9 F (36.6 C), temperature source Oral, resp. rate 20, height 6' (1.829 m), weight 108.3 kg (238 lb 12.1 oz), SpO2 92.00%. He does look somewhat better and his work of breathing is less. He still remains in atrial fibrillation with rapid ventricular response but less so. Lung fields are clear her today.   Investigations:  Recent Results (from the past 240 hour(s))  MRSA PCR SCREENING     Status: Abnormal   Collection Time   08/21/11  7:48 PM      Component Value Range Status Comment   MRSA by PCR POSITIVE (*) NEGATIVE  Final      Basic Metabolic Panel:  Basename 08/22/11 0520 08/21/11 1113  NA 144 142  K 3.1* 3.7  CL 102 107  CO2 33* 28  GLUCOSE 123* 132*  BUN 12 16  CREATININE 0.98 0.91  CALCIUM 9.3 9.5  MG -- --  PHOS -- --   Liver Function Tests:  [facility] 08/22/11 0520  AST 20  ALT 19  ALKPHOS 73  BILITOT 0.5  PROT 6.4  ALBUMIN 3.5     CBC:  Basename 08/22/11 0520 08/21/11 1113  WBC 6.4 6.6  NEUTROABS -- --  HGB 11.2* 10.9*  HCT 36.0* 35.7*  MCV 92.8 93.2  PLT 270 296    Dg Chest Portable 1 View  08/21/2011  *RADIOLOGY REPORT*  Clinical Data: Shortness of breath.  History of prostate and colon cancer.  PORTABLE CHEST - 1 VIEW  Comparison: PA and lateral chest 01/30/2011.  CT chest 04/13/2009.  Findings: There is marked cardiomegaly.  Lungs are emphysematous. Bibasilar airspace disease is present and worse on the left.  IMPRESSION: Cardiomegaly left worse  right basilar airspace disease which could reflect asymmetric edema or pneumonia.  Original Report Authenticated By: Bernadene Bell. Maricela Curet, M.D.      Medications:  Scheduled:   . albuterol  5 mg Nebulization Once  . albuterol      . azithromycin  500 mg Intravenous Once  . cefTRIAXone (ROCEPHIN)  IV  1 g Intravenous Once  . Chlorhexidine Gluconate Cloth  6 each Topical Q0600  . citalopram  40 mg Oral Daily  . diltiazem (CARDIZEM) infusion  5 mg/hr Intravenous Once  . diltiazem  10 mg Intravenous Once  . diltiazem  10 mg Intravenous Once  . Fluticasone-Salmeterol  1 puff Inhalation Q12H  . furosemide  40 mg Intravenous Q12H  . heparin  5,000 Units Subcutaneous Q8H  . HYDROmorphone      . HYDROmorphone      .  HYDROmorphone (DILAUDID) injection  2 mg Intravenous Once  . imatinib  400 mg Oral QPM  . ipratropium      . ipratropium  0.5 mg Nebulization Once  . levalbuterol      . levalbuterol  0.63 mg Nebulization Q6H  .  morphine injection  2 mg Intravenous Once  . morphine      . mupirocin ointment  1 application Nasal BID  . oxyCODONE  10  mg Oral Q12H  . oxyCODONE-acetaminophen  2 tablet Oral Once  . potassium chloride  40 mEq Oral BID  . sodium chloride  3 mL Intravenous Q12H  . venlafaxine  75 mg Oral BID  . DISCONTD: diltiazem (CARDIZEM) infusion  10 mg/hr Intravenous Once  . DISCONTD: lisinopril-hydrochlorothiazide  2 tablet Oral Daily  . DISCONTD: potassium chloride  20 mEq Oral BID    Impression: 1. Congestive heart failure. 2. Atrial fibrillation with rapid ventricular response. 3. Hypertension. 4. CLL. 5. History prostate cancer. 6. Hypokalemia.     Plan: 1. Continue current therapy. Consider addition of digoxin. 2. Await cardiology consultation and echocardiogram result. 3. Replete potassium.     LOS: 1 day   Wilson Singer Pager [phone]  08/22/2011, 7:53 AM

## 2011-08-22 NOTE — Progress Notes (Addendum)
Mr. Kushner has been confused since the start of shift.  Report was it began around 1700.  Spoke with his wife Verlon Au and she is willing to come in and stay with him.  Spoke with Dr. Onalee Hua as patient has been continual fall risk.  OOB x2 to bsc with assist.  He did have a large BM. Has no c/o at present,just wants to go to [place] his house and get his pills.  Also wanted to go to [facility] at one time. Monitoring closely.  Nighttime medications administered.  Awaiting results. Notified Dr. Onalee Hua on call concerning need for blood culture for ED pneumonia dx.  Blood culture not needed due to admitting dx of CHF.

## 2011-08-22 NOTE — Consult Note (Signed)
NAME:  Mitchell [name], Mitchell [name] NO.:  [id number]  MEDICAL RECORD NO.:  [id number]  LOCATION:  [facility]                          FACILITY:  [facility]  PHYSICIAN:  Ky Barban, M.D.DATE OF BIRTH:  Oct 13, 1945  DATE OF CONSULTATION: DATE OF DISCHARGE:  08/21/2011                                CONSULTATION   HISTORY OF PRESENT ILLNESS:  He is a 66 year old gentleman well known to me and I seen him yesterday in the office with difficulty to void. Cystoscopy was done.  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  The patient was also having gross hematuria, and after cystoscopy, I decided to do a TUR bladder neck.  The patient is known to me.  Had radical retropubic prostatectomy done in [facility] several years ago for prostate cancer and has developed bladder neck stenosis.  He is also having difficulty to void.  So after cystoscopy, I decided to do TUR bladder neck.  He was scheduled for today this morning, but he was started to have chest pain went to the emergency room.  He was found to have atrial fibrillation.  He has multiple other problems.  So, I canceled this procedure.  He is admitted in ICU here and having difficulty to void.  He is in urinary retention.  So, I came in because I was asked to see him here and he is in urinary retention, so I dilated his bladder neck with Sissy Hoff sounds and I was able to insert #18 Foley catheter in the bladder, which was draining now.  The gross hematuria has cleared.  His other medical problems include COPD, prostate cancer, colon cancer, leukemia, hypertension, pneumonia.  PAST SURGICAL HISTORY:  Include radical prostatectomy and colon surgery, history of TUR of a bladder neck by me for several years ago.  PHYSICAL EXAMINATION:  Moderately obese male who looks to be in distress.  Abdomen is soft, nondistended.  External genitalia is uncircumcised.  Meatus adequate.  Testicles are normal.  Rectal examination is  deferred.  LAB DATA:  His hemoglobin is 10.9, WBC count is 6.6.  Sodium 142, potassium 3.7, chloride 107, CO2 is 28, glucose 132, BUN is 16, creatinine 0.91.  He was diagnosed with congestive heart failure today and atrial fibrillation with rapid ventricular response.  Bladder neck stenosis and I was able to dilate him with Sissy Hoff sounds without difficulty and left #20 Foley catheter in, which is draining clear urine.  I will follow him once he is stable and medically cleared, then I can do TUR bladder neck.    Ky Barban, M.D.    MIJ/MEDQ  D:  08/21/2011  T:  08/22/2011  Job:  [id number]

## 2011-08-22 NOTE — Consult Note (Signed)
CARDIOLOGY CONSULT NOTE  Patient ID: Mitchell [name] MRN: [id number] DOB/AGE: 01-12-1946 66 y.o.  Admit date: 08/21/2011 Referring Physician: PTH Primary Galen Daft, MD, MD Primary Cardiologist: Dietrich Pates Reason for Consultation: Afib with RVR,CHF Principal Problem:  *CHF (congestive heart failure) Active Problems:  CLL  OBESITY NOS  HYPERTENSION  PROSTATE CANCER, HX OF  AF (atrial fibrillation)  HPI: Mr. Mitchell [name] is a 66 year old patient admitted with A. fib RVR, CHF, after presenting to outpatient to have a cystoscopy and possibly TURP of the bladder neck. This was planned in the setting of painless hematuria. He  known history of COPD, paroxysmal atrial fibrillation, prostate cancer, status post prostatectomy, hypertension, depression, and anxiety. He was last seen by Dr. Dietrich Pates in 2010 on consultation during hospitalization at [facility] for COPD exacerbation with atrial fib with RVR , MAT,at that time. He has been lost to cardiology follow-up inour office since that time. He was recommended to have an OP stress test back then.     His wife states he was recently admitted to [facility] about 3 weeks ago for afib, RVR and dyspnea. He was seen by cardiology then, she thinks Dr. Andee Lineman. He was continued on metoprolol on discharge. She states he is normally followed by Dr. Wilford Grist at [facility] for cardiology needs, with a stress test several weeks ago. He is due to see them next week on follow-up.     He states that he has noticed increasing dyspnea and fluid retention over the last few weeks, with increased palpitations    He has been placed on Cardizem drip after 10 mg bolus and is now rate controlle on 5 mg hour.Marland KitchenHe has been placed on IV lasix with > 2000cc urine output. A catheter has been placed by Dr. Frann Rider secondary to bladder neck stenosis.  CXR showed cardiomegaly left worse right basilar airspace disease, asymmetric edema or pneumonia.BNP 4869, troponin negative X 3, with  elevated CK-MB,6.4 and 6.1 respectively. Most recent echo in 2010 demonstrated normal EF with mild to moderately dilated LA. Follow up echo is ordered. We are asked for further recommendations.   Review of systems complete and found to be negative unless listed above   Past Medical History  Diagnosis Date  . COPD (chronic obstructive pulmonary disease)   . Cancer   . Prostate cancer   . Colon cancer   . Leukemia   . Hypertension   . Pneumonia   . Leukemia     Family History  Problem Relation Age of Onset  . Heart attack Mother   . Cancer Father     History   Social History  . Marital Status: Married    Spouse Name: Mitchell [name]    Number of Children: Mitchell [name]  . Years of Education: Mitchell [name]   Occupational History  . Not on file.   Social History Main Topics  . Smoking status: Current Everyday Smoker -- 0.5 packs/day  . Smokeless tobacco: Not on file  . Alcohol Use: No  . Drug Use: No  . Sexually Active:    Other Topics Concern  . Not on file   Social History Narrative   Lives in [place] with his wife.  Smoker.     Past Surgical History  Procedure Date  . Colon surgery   . Prostate surgery      Prescriptions prior to admission  Medication Sig Dispense Refill  . metoprolol succinate (TOPROL-XL) 25 MG 24 hr tablet Take 25 mg by mouth daily.      Marland Kitchen  oxycodone (OXY-IR) 5 MG capsule Take 5 mg by mouth every 4 (four) hours as needed. For breakthrough pain      . oxyCODONE (OXYCONTIN) 40 MG 12 hr tablet Take 40 mg by mouth every 12 (twelve) hours.      . Rivaroxaban (XARELTO) 20 MG TABS Take 1 tablet by mouth daily.      . theophylline (THEODUR) 300 MG 12 hr tablet Take 300 mg by mouth 2 (two) times daily.      Marland Kitchen albuterol (PROVENTIL HFA;VENTOLIN HFA) 108 (90 BASE) MCG/ACT inhaler Inhale 2 puffs into the lungs every 6 (six) hours as needed. For shortness of breath       . ALPRAZolam (XANAX) 1 MG tablet Take 1 mg by mouth at bedtime as needed. For sleep       . citalopram (CELEXA) 40 MG  tablet Take 40 mg by mouth daily.        Marland Kitchen docusate sodium (COLACE) 100 MG capsule Take 100 mg by mouth as needed. For constipation       . Fluticasone-Salmeterol (ADVAIR DISKUS) 250-50 MCG/DOSE AEPB Inhale 1 puff into the lungs every 12 (twelve) hours.        Marland Kitchen HYDROcodone-acetaminophen (LORTAB) 10-500 MG per tablet Take 1 tablet by mouth 3 (three) times daily as needed. For pain      . imatinib (GLEEVEC) 400 MG tablet Take 400 mg by mouth every evening. Take with meals and large glass of water.Caution:Chemotherapy.       Marland Kitchen Leuprolide Acetate (LUPRON IJ) Inject 1 each as directed every 4 (four) months.        Marland Kitchen lisinopril-hydrochlorothiazide (PRINZIDE,ZESTORETIC) 20-25 MG per tablet Take 2 tablets by mouth daily.        . mometasone (ELOCON) 0.1 % cream Apply 1 application topically daily as needed. Skin lesions       . potassium chloride (KLOR-CON) 10 MEQ CR tablet Take 30 mEq by mouth daily.       . theophylline (THEO-24) 300 MG 24 hr capsule Take 300 mg by mouth 2 (two) times daily.        . THEOPHYLLINE PO Take 1 tablet by mouth 2 (two) times daily.       Marland Kitchen venlafaxine (EFFEXOR) 75 MG tablet Take 75 mg by mouth 2 (two) times daily.          Physical Exam: Blood pressure 160/95, pulse 108, temperature 97.9 F (36.6 C), temperature source Oral, resp. rate 20, height 6' (1.829 m), weight 238 lb 12.1 oz (108.3 kg), SpO2 92.00%.   General: Well developed, well nourished, obese, short of breath with speaking. Head: Eyes PERRLA, No xanthomas.   Normal cephalic and atramatic.Neck obese  Lungs:Inspriatory wheezes, diminished in the bases, shallow breathing. Heart: HRiR S1 S2, without MRG.  Pulses are 2+ & equal.            No carotid bruit. No JVD.  No abdominal bruits. No femoral bruits. Abdomen: Bowel sounds are positive, abdomen mildly distended and non-tender without masses or                  Hernia's noted. Msk:  Back normal, t Normal strength and tone for age. Extremities: Positive for  clubbing, cyanosis mild non-pitting edema.  DP +1 Neuro: Alert and oriented X 3. Psych:  Good affect, responds appropriately. Poor memory  Labs:   Lab Results  Component Value Date   WBC 6.4 08/22/2011   HGB 11.2* 08/22/2011   HCT 36.0* 08/22/2011  MCV 92.8 08/22/2011   PLT 270 08/22/2011     Lab 08/22/11 0520  NA 144  K 3.1*  CL 102  CO2 33*  BUN 12  CREATININE 0.98  CALCIUM 9.3  PROT 6.4  BILITOT 0.5  ALKPHOS 73  ALT 19  AST 20  GLUCOSE 123*   Lab Results  Component Value Date   CKTOTAL PENDING 08/22/2011   CKMB 6.1* 08/22/2011   TROPONINI <0.30 08/22/2011    Lab Results  Component Value Date   CHOL  Value: 131        ATP III CLASSIFICATION:  <200     mg/dL   Desirable  161-096  mg/dL   Borderline High  >=045    mg/dL   High        40/98/1191   CHOL  Value: 141        ATP III CLASSIFICATION:  <200     mg/dL   Desirable  478-295  mg/dL   Borderline High  >=621    mg/dL   High        30/86/5784   Lab Results  Component Value Date   HDL 40 04/17/2009   HDL 46 04/17/2009   Lab Results  Component Value Date   LDLCALC  Value: 62        Total Cholesterol/HDL:CHD Risk Coronary Heart Disease Risk Table                     Men   Women  1/2 Average Risk   3.4   3.3  Average Risk       5.0   4.4  2 X Average Risk   9.6   7.1  3 X Average Risk  23.4   11.0        Use the calculated Patient Ratio above and the CHD Risk Table to determine the patient's CHD Risk.        ATP III CLASSIFICATION (LDL):  <100     mg/dL   Optimal  696-295  mg/dL   Near or Above                    Optimal  130-159  mg/dL   Borderline  284-132  mg/dL   High  >440     mg/dL   Very High 02/22/2535   LDLCALC  Value: 71        Total Cholesterol/HDL:CHD Risk Coronary Heart Disease Risk Table                     Men   Women  1/2 Average Risk   3.4   3.3  Average Risk       5.0   4.4  2 X Average Risk   9.6   7.1  3 X Average Risk  23.4   11.0        Use the calculated Patient Ratio above and the CHD Risk Table to  determine the patient's CHD Risk.        ATP III CLASSIFICATION (LDL):  <100     mg/dL   Optimal  644-034  mg/dL   Near or Above                    Optimal  130-159  mg/dL   Borderline  742-595  mg/dL   High  >638     mg/dL   Very High 75/64/3329   Lab Results  Component Value  Date   TRIG 146 04/17/2009   TRIG 119 04/17/2009   Lab Results  Component Value Date   CHOLHDL 3.3 04/17/2009   CHOLHDL 3.1 04/17/2009   No results found for this basename: LDLDIRECT    ECHOCARDIOGRAM 04/10/2009  Left ventricle: The cavity size was mildly dilated. Wall thickness was increased increased in a pattern of mild to moderate LVH. Systolic function was normal. The estimated ejection fraction was in the range of 60% to 65%. Wall motion was normal; there were no regional wall motion abnormalities. - Aortic valve: Mildly calcified annulus. - Left atrium: The atrium was mildly to moderately dilated. - Right ventricle: The cavity size was normal. Wall thickness was moderately increased.  Radiology: Dg Chest Portable 1 View  08/21/2011  *RADIOLOGY REPORT*  Clinical Data: Shortness of breath.  History of prostate and colon cancer.  PORTABLE CHEST - 1 VIEW  Comparison: PA and lateral chest 01/30/2011.  CT chest 04/13/2009.  Findings: There is marked cardiomegaly.  Lungs are emphysematous. Bibasilar airspace disease is present and worse on the left.  IMPRESSION: Cardiomegaly left worse right basilar airspace disease which could reflect asymmetric edema or pneumonia.  Original Report Authenticated By: Bernadene Bell. D'ALESSIO, M.D.    EKG: Coarse Atrial fib rate of 130 bpm (Admission)Question MAT. 08/22/2011: Coarse Afib rate of 90 bpm,   ASSESSMENT AND PLAN:   1. Atrial fibrillation with RVR: He has a history of paroxysmal atrial fibrillation and MAT, diagnosed in 2010 hospitalization at [facility]. Currently on Cardizem drip at 5 mg an hour, with subcutaneous heparin. Metoprolol has not been restarted with HR in  the 90s. [place] score (2) hypertension, CHF. He has had some hematuria, and is being treated by Dr. Frann Rider for this after a cystoscopy and planned TURP. Report of cystoscopy is not available, but in reading Dr. Rudean Haskell note. He did not mention any active bleeding within the bladder. Hgb 11.2, Hct 36.0. Creatinine is 0.98. Can consider anticoagulation options later after his urological  Issues are addressed and there are no plans for further surgeries.  2. CHF: Likely related to elevated heart rate in the setting of rapid atrial fib. He is diuresing with lasix 40 mg IV BID. Creatinine is stable. Agree with echo for LV fx and evaluation of atria. Uncertain of dry wt.  Will make further recommendations for medical management after results.  3. COPD: Chronic with ongoing tobacco use. He is being treated with inhalers and also abx with rocephin and Zithromax.   Signed: Bettey Mare. Lyman Bishop NP [facility] 08/22/2011, 8:46 AM Co-Sign MD   Attending note:  Patient seen and examined. Reviewed limited records and discussed history with the patient and his wife. He has been seen in the past by Dr. Dietrich Pates, now states that he follows at [facility] with Dr. Wilford Grist for management of paroxysmal atrial fibrillation and a "valve problem." It sounds like he has undergone evaluation including an echocardiogram and pharmacologic stress test, although details are not clear. He was actually scheduled to followup at [facility] next week to discuss the next step. My understanding is that he has been on both Toprol-XL and Xarelto as an outpatient, although Xarelto was held recently in light of planned urologic surgery.  He is now admitted to the hospital with finding of rapid atrial fibrillation, also more short of breath. He reports compliance with his medications. He is on intravenous Cardizem, now at 15 mg per hour, was not placed back on Toprol-XL. Heart rate is not  well controlled in the 100-120 range.  Also states that he  was admitted to [facility] recently with "pneumonia." I do not have these records at hand.  Lab work reviewed finding potassium 3.1, BUN 12, creatinine 0.9, normal troponin levels, proBNP 4869, hemoglobin 11.2, hematocrit 36.0, platelets 270, urinalysis with large amount of hemoglobin, too numerous to count white and red cells, many bacteria, positive nitrites, greater than 300 protein. ECG reviewed showing probable atrial fibrillation although some atrial activity is noted that appears to be more organized. He has a left anterior fascicular block and right bundle branch block, also poor anterior R-wave progression. Chest x-ray reports cardiomegaly, left worse right basilar airspace disease which could reflect asymmetric edema or pneumonia. Followup echocardiogram is pending.  At this point would focus on heart rate control. As noted, he is off Xarelto in light of his potential for urologic surgery. He is undergoing cardiac evaluation at [facility] at the present time, although details are unclear, and they have not yet had followup to discuss his recent testing. Plan to rebolus with Cardizem and continue present rate, add Toprol-XL 25 mg back to his regimen. Expect that he will need a combination of beta blocker and calcium channel blocker for adequate heart rate control. We will follow him during this hospitalization, although I anticipate that he will keep his regular cardiology care at [facility] in light of ongoing workup.  Jonelle Sidle, M.D., F.A.C.C.

## 2011-08-22 NOTE — Progress Notes (Signed)
*  PRELIMINARY RESULTS* Echocardiogram 2D Echocardiogram has been performed.  Mitchell [name] 08/22/2011, 10:43 AM

## 2011-08-22 NOTE — Progress Notes (Signed)
Pt given 10 mg iv bolus of Cardizem, as ordered by Dr Adriana Simas, at 1150. Toprol xL 25 mg given at 1200. HR sinus tachycardia at this time 105-125 bpm. Pt c/o chest tightness and dyspnea. Morphine 2 mg iv given at 1121, with moderate relief of symptoms.

## 2011-08-22 NOTE — Progress Notes (Signed)
Pt experienced an acute episode of crushing chest pain and dyspnea while on the bedside commode. Nitroglycerin, sublingual, given x 3. First dose given at 1325.  EKG obtained. Dr Karilyn Cota notified by phone. Order received for morphine and digoxin, and meds given as ordered. Xopenex nebulizer treatment given by RT. O2 by nasal canula increased to 4 liters.  BP 138/110 HR 114 at 1323, now  120/72 HR 107. Pt states his pain is "7 out of 10, and better than it has been all day." States pain is a dull ache now. Dr Karilyn Cota updated on patients status. Pt advised to immediately inform nursing staff of any change in symptoms. Call bell within reach, wife at bedside.

## 2011-08-22 NOTE — Progress Notes (Signed)
Gave pt prn treatment for wheezing, he is totally confused

## 2011-08-22 NOTE — Progress Notes (Signed)
Remain sitting with patient for patient safety.  Mild drowsiness may be coming on.  Continues to attempt to get oob.  Removing equipment.  Vitals stable 122/83, HR 81, RR 23, Oxygen saturation 95 on 4L nasal cannula

## 2011-08-22 NOTE — Plan of Care (Signed)
Problem: Consults Goal: Atrial Arhythmia Patient Education (See Patient Education module for education specifics.)  Outcome: Progressing On cardizem gtt at present,patient is confused and not teachable at present Goal: Tobacco Cessation referral if indicated Outcome: Progressing Patient is confused and asking for cigarettes from staff  Problem: Phase I Progression Outcomes Goal: Heart rate or rhythm control medication Outcome: Progressing Cardizem gtt at 15cc/hr Goal: Pain controlled with appropriate interventions Outcome: Progressing Q12 hr pain medicine scheduled with breakthrough vicodin

## 2011-08-23 ENCOUNTER — Inpatient Hospital Stay (HOSPITAL_COMMUNITY): Payer: Medicare Other

## 2011-08-23 LAB — CARDIAC PANEL(CRET KIN+CKTOT+MB+TROPI)
CK, MB: 4.5 ng/mL — ABNORMAL HIGH (ref 0.3–4.0)
Relative Index: INVALID (ref 0.0–2.5)
Total CK: 54 U/L (ref 7–232)
Troponin I: <0.3 ng/mL

## 2011-08-23 LAB — CBC
HCT: 38.3 % — ABNORMAL LOW (ref 39.0–52.0)
Hemoglobin: 11.9 g/dL — ABNORMAL LOW (ref 13.0–17.0)
MCH: 28.5 pg (ref 26.0–34.0)
MCHC: 31.1 g/dL (ref 30.0–36.0)
MCV: 91.8 fL (ref 78.0–100.0)
Platelets: 302 K/uL (ref 150–400)
RBC: 4.17 MIL/uL — ABNORMAL LOW (ref 4.22–5.81)
RDW: 14.8 % (ref 11.5–15.5)
WBC: 6.4 K/uL (ref 4.0–10.5)

## 2011-08-23 LAB — BASIC METABOLIC PANEL WITH GFR
BUN: 10 mg/dL (ref 6–23)
CO2: 35 meq/L — ABNORMAL HIGH (ref 19–32)
Calcium: 9.4 mg/dL (ref 8.4–10.5)
Chloride: 102 meq/L (ref 96–112)
Creatinine, Ser: 0.87 mg/dL (ref 0.50–1.35)
GFR calc Af Amer: >90 mL/min
GFR calc non Af Amer: 88 mL/min — ABNORMAL LOW
Glucose, Bld: 119 mg/dL — ABNORMAL HIGH (ref 70–99)
Potassium: 3.4 meq/L — ABNORMAL LOW (ref 3.5–5.1)
Sodium: 144 meq/L (ref 135–145)

## 2011-08-23 MED ORDER — POTASSIUM CHLORIDE CRYS ER 20 MEQ PO TBCR
40.0000 meq | EXTENDED_RELEASE_TABLET | Freq: Once | ORAL | Status: AC
  Administered 2011-08-23: 40 meq via ORAL

## 2011-08-23 MED ORDER — DILTIAZEM HCL 60 MG PO TABS
90.0000 mg | ORAL_TABLET | Freq: Four times a day (QID) | ORAL | Status: DC
  Administered 2011-08-23 – 2011-08-24 (×5): 90 mg via ORAL
  Filled 2011-08-23: qty 1
  Filled 2011-08-23: qty 3
  Filled 2011-08-23 (×3): qty 1

## 2011-08-23 MED ORDER — ALPRAZOLAM 1 MG PO TABS
1.0000 mg | ORAL_TABLET | Freq: Three times a day (TID) | ORAL | Status: DC | PRN
  Administered 2011-08-23 – 2011-08-24 (×2): 1 mg via ORAL
  Filled 2011-08-23: qty 1

## 2011-08-23 NOTE — Progress Notes (Signed)
Subjective: This man was confused overnight. He apparently does this when he is hospitalized. He was given Xanax and this actually did help him. He feels alert and orientated now. His breathing he says is much improved. He remains in atrial fibrillation with rapid ventricle response but the ventricular rate is better controlled now.           Physical Exam: Blood pressure 162/97, pulse 60, temperature 98 F (36.7 C), temperature source Oral, resp. rate 24, height 6' (1.829 m), weight 108.3 kg (238 lb 12.1 oz), SpO2 92.00%. He does look somewhat better and his work of breathing is less. He still remains in atrial fibrillation with rapid ventricular response but less so. Lung fields are clearer today.   Investigations:  Recent Results (from the past 240 hour(s))  URINE CULTURE     Status: Normal   Collection Time   08/21/11 12:12 PM      Component Value Range Status Comment   Specimen Description URINE, CLEAN CATCH   Final    Special Requests NONE   Final    Culture  Setup Time [id number]   Final    Colony Count NO GROWTH   Final    Culture NO GROWTH   Final    Report Status 08/22/2011 FINAL   Final   MRSA PCR SCREENING     Status: Abnormal   Collection Time   08/21/11  7:48 PM      Component Value Range Status Comment   MRSA by PCR POSITIVE (*) NEGATIVE  Final      Basic Metabolic Panel:  Basename 08/23/11 0525 08/22/11 0520  NA 144 144  K 3.4* 3.1*  CL 102 102  CO2 35* 33*  GLUCOSE 119* 123*  BUN 10 12  CREATININE 0.87 0.98  CALCIUM 9.4 9.3  MG -- --  PHOS -- --   Liver Function Tests:  [facility] 08/22/11 0520  AST 20  ALT 19  ALKPHOS 73  BILITOT 0.5  PROT 6.4  ALBUMIN 3.5     CBC:  Basename 08/22/11 0520 08/21/11 1113  WBC 6.4 6.6  NEUTROABS -- --  HGB 11.2* 10.9*  HCT 36.0* 35.7*  MCV 92.8 93.2  PLT 270 296    Dg Chest Port 1 View  08/22/2011  *RADIOLOGY REPORT*  Clinical Data: Follow-up congestive heart failure  PORTABLE CHEST - 1 VIEW   Comparison: 08/21/2011  Findings: Marked cardiomegaly is stable in appearance.  A stable mediastinal configuration is seen.  There is pulmonary vascular apical redistribution with vessels appearing well delineated. Stable scarring is seen in the left midlung zone.  Mild density at both lung bases left greater than right is unchanged and may reflect residual basilar edema or pneumonia.  Little interval changes noted in comparison with the prior exam with no definite pleural fluid seen.  IMPRESSION: Cardiomegaly with unchanged bibasilar air space disease questionably reflecting basilar edema fluid or pneumonia.  Original Report Authenticated By: Bertha Stakes, M.D.   Dg Chest Portable 1 View  08/21/2011  *RADIOLOGY REPORT*  Clinical Data: Shortness of breath.  History of prostate and colon cancer.  PORTABLE CHEST - 1 VIEW  Comparison: PA and lateral chest 01/30/2011.  CT chest 04/13/2009.  Findings: There is marked cardiomegaly.  Lungs are emphysematous. Bibasilar airspace disease is present and worse on the left.  IMPRESSION: Cardiomegaly left worse right basilar airspace disease which could reflect asymmetric edema or pneumonia.  Original Report Authenticated By: Bernadene Bell. Maricela Curet, M.D.  Medications:  Scheduled:    . albuterol      . antiseptic oral rinse  15 mL Mouth Rinse BID  . Chlorhexidine Gluconate Cloth  6 each Topical Q0600  . citalopram  40 mg Oral Daily  . digoxin  0.25 mg Intravenous Q4H  . digoxin  0.25 mg Oral Daily  . diltiazem  90 mg Oral Q6H  . Fluticasone-Salmeterol  1 puff Inhalation Q12H  . furosemide  40 mg Intravenous Q12H  . heparin  5,000 Units Subcutaneous Q8H  . imatinib  400 mg Oral QPM  . levalbuterol      . levalbuterol  0.63 mg Nebulization Q6H  . metoprolol succinate  25 mg Oral Daily  .  morphine injection  2 mg Intravenous Once  . mupirocin ointment  1 application Nasal BID  . nitroGLYCERIN      . oxyCODONE  10 mg Oral Q12H  . potassium  chloride  40 mEq Oral BID  . potassium chloride  40 mEq Oral Once  . potassium chloride  40 mEq Oral Once  . sodium chloride  3 mL Intravenous Q12H  . sodium chloride      . venlafaxine  75 mg Oral BID  . DISCONTD: potassium chloride  20 mEq Oral BID    Impression: 1. Congestive heart failure. 2. Atrial fibrillation with rapid ventricular response. 3. Hypertension. 4. CLL. 5. History prostate cancer. 6. Hypokalemia.     Plan: 1. Repeat chest x-ray this morning. 2. Replete potassium. 3. Try to wean off Cardizem drip and start short-acting oral Cardizem.     LOS: 2 days   Wilson Singer Pager [phone]  08/23/2011, 7:44 AM

## 2011-08-23 NOTE — Plan of Care (Signed)
Problem: Phase II Progression Outcomes Goal: Ventricular heart rate < 100/min Outcome: Completed/Met Date Met:  08/23/11 HR 64 at present Goal: Anticoagulation Therapy per MD order Outcome: Completed/Met Date Met:  08/23/11 Heparin injections Q8hrs Goal: CV Risk Factors identified Outcome: Completed/Met Date Met:  08/23/11 Patient remains in Afib with rate controlled Goal: Pain controlled Outcome: Completed/Met Date Met:  08/23/11 Present medications meeting pain level needs Goal: Progress activity as tolerated unless otherwise ordered Outcome: Progressing OOB to chair

## 2011-08-24 LAB — BASIC METABOLIC PANEL
BUN: 14 mg/dL (ref 6–23)
Creatinine, Ser: 1.02 mg/dL (ref 0.50–1.35)
GFR calc Af Amer: 86 mL/min — ABNORMAL LOW (ref >90)
GFR calc non Af Amer: 75 mL/min — ABNORMAL LOW (ref >90)
Potassium: 3.8 mEq/L (ref 3.5–5.1)

## 2011-08-24 LAB — PRO B NATRIURETIC PEPTIDE: Pro B Natriuretic peptide (BNP): 3367 pg/mL — ABNORMAL HIGH (ref 0–125)

## 2011-08-24 MED ORDER — DILTIAZEM HCL ER COATED BEADS 180 MG PO CP24
300.0000 mg | ORAL_CAPSULE | Freq: Every day | ORAL | Status: DC
  Administered 2011-08-24: 300 mg via ORAL
  Filled 2011-08-24: qty 1

## 2011-08-24 MED ORDER — FUROSEMIDE 40 MG PO TABS
40.0000 mg | ORAL_TABLET | Freq: Two times a day (BID) | ORAL | Status: DC
  Administered 2011-08-24 – 2011-08-26 (×4): 40 mg via ORAL
  Filled 2011-08-24 (×4): qty 1

## 2011-08-24 MED ORDER — LISINOPRIL 10 MG PO TABS
40.0000 mg | ORAL_TABLET | Freq: Every day | ORAL | Status: DC
  Administered 2011-08-24 – 2011-08-26 (×3): 40 mg via ORAL
  Filled 2011-08-24 (×3): qty 4

## 2011-08-24 MED ORDER — SODIUM CHLORIDE 0.9 % IJ SOLN
INTRAMUSCULAR | Status: AC
  Administered 2011-08-24: 10 mL
  Filled 2011-08-24: qty 3

## 2011-08-24 MED ORDER — SODIUM CHLORIDE 0.9 % IJ SOLN
INTRAMUSCULAR | Status: AC
  Administered 2011-08-24: 15:00:00
  Filled 2011-08-24: qty 3

## 2011-08-24 NOTE — Progress Notes (Signed)
Pt received to room 311 from icu. Report given to me by Jerl Santos RN. Pt alert to self. Confused to all else. o2 in place at 3l/min via Aneth. Iv patent, foley catheter patent. Wife at bedside.no c/o chest pain.

## 2011-08-24 NOTE — Progress Notes (Signed)
Patient transferred to room 311 via wheelchair with O2 by NT. VSS. Report given to Billy Coast, RN.

## 2011-08-24 NOTE — Progress Notes (Signed)
Subjective: This man remains somewhat intermittently confused. His ventricular rate is greatly improved. He feels better and is less short of breath.           Physical Exam: Blood pressure 134/83, pulse 69, temperature 97.7 F (36.5 C), temperature source Oral, resp. rate 21, height 6' (1.829 m), weight 103.2 kg (227 lb 8.2 oz), SpO2 94.00%. There is no increased work of breathing. There is no peripheral central cyanosis. Heart sounds are present and in atrial fibrillation with a controlled ventricular rate. Lung fields are entirely clear. There is no peripheral pitting edema in his legs whatsoever. He is alert, somewhat forgetful of events around him.  Investigations:  Recent Results (from the past 240 hour(s))  URINE CULTURE     Status: Normal   Collection Time   08/21/11 12:12 PM      Component Value Range Status Comment   Specimen Description URINE, CLEAN CATCH   Final    Special Requests NONE   Final    Culture  Setup Time [id number]   Final    Colony Count NO GROWTH   Final    Culture NO GROWTH   Final    Report Status 08/22/2011 FINAL   Final   MRSA PCR SCREENING     Status: Abnormal   Collection Time   08/21/11  7:48 PM      Component Value Range Status Comment   MRSA by PCR POSITIVE (*) NEGATIVE  Final      Basic Metabolic Panel:  Basename 08/24/11 0416 08/23/11 0525  NA 140 144  K 3.8 3.4*  CL 100 102  CO2 36* 35*  GLUCOSE 112* 119*  BUN 14 10  CREATININE 1.02 0.87  CALCIUM 9.5 9.4  MG -- --  PHOS -- --   Liver Function Tests:  [facility] 08/22/11 0520  AST 20  ALT 19  ALKPHOS 73  BILITOT 0.5  PROT 6.4  ALBUMIN 3.5     CBC:  Basename 08/23/11 0745 08/22/11 0520  WBC 6.4 6.4  NEUTROABS -- --  HGB 11.9* 11.2*  HCT 38.3* 36.0*  MCV 91.8 92.8  PLT 302 270    Dg Chest Port 1 View  08/23/2011  *RADIOLOGY REPORT*  Clinical Data: Shortness of breath and wheezing.  Atrial fibrillation with rapid ventricular response.  PORTABLE CHEST - 1 VIEW   Comparison: 08/22/2011  Findings: Stable cardiomegaly.  No gross pulmonary edema.  Stable scattered atelectasis present in both lungs.  No significant pleural effusions.  IMPRESSION: Stable cardiomegaly and bilateral atelectasis.  Original Report Authenticated By: Reola Calkins, M.D.   Dg Chest Port 1 View  08/22/2011  *RADIOLOGY REPORT*  Clinical Data: Follow-up congestive heart failure  PORTABLE CHEST - 1 VIEW  Comparison: 08/21/2011  Findings: Marked cardiomegaly is stable in appearance.  A stable mediastinal configuration is seen.  There is pulmonary vascular apical redistribution with vessels appearing well delineated. Stable scarring is seen in the left midlung zone.  Mild density at both lung bases left greater than right is unchanged and may reflect residual basilar edema or pneumonia.  Little interval changes noted in comparison with the prior exam with no definite pleural fluid seen.  IMPRESSION: Cardiomegaly with unchanged bibasilar air space disease questionably reflecting basilar edema fluid or pneumonia.  Original Report Authenticated By: Bertha Stakes, M.D.      Medications:  Scheduled:    . antiseptic oral rinse  15 mL Mouth Rinse BID  . Chlorhexidine Gluconate Cloth  6 each Topical  [id number]  . citalopram  40 mg Oral Daily  . digoxin  0.25 mg Oral Daily  . diltiazem  300 mg Oral Daily  . Fluticasone-Salmeterol  1 puff Inhalation Q12H  . furosemide  40 mg Oral BID  . heparin  5,000 Units Subcutaneous Q8H  . imatinib  400 mg Oral QPM  . levalbuterol  0.63 mg Nebulization Q6H  . metoprolol succinate  25 mg Oral Daily  . mupirocin ointment  1 application Nasal BID  . oxyCODONE  10 mg Oral Q12H  . potassium chloride  40 mEq Oral BID  . potassium chloride  40 mEq Oral Once  . sodium chloride  3 mL Intravenous Q12H  . venlafaxine  75 mg Oral BID  . DISCONTD: diltiazem  90 mg Oral Q6H  . DISCONTD: furosemide  40 mg Intravenous Q12H    Impression: 1. Congestive heart  failure, compensated. 2. Atrial fibrillation with controlled ventricular rate. 3. Hypertension. 4. CLL. 5. History prostate cancer.      Plan: 1. Switch short acting Cardizem to long-acting Cardizem. 2. Discontinue IV Lasix and switched to oral Lasix. 3. Move to telemetry floor now. 4. Contact Dr. Jerre Simon, he should be able to go ahead with procedure of TUR bladder neck, probably tomorrow. Continue to hold Xeralto.     LOS: 3 days   Wilson Singer Pager [phone]  08/24/2011, 7:23 AM

## 2011-08-25 ENCOUNTER — Encounter (HOSPITAL_COMMUNITY)
Admission: EM | Disposition: A | Discharge status: Home / Self Care | Payer: Self-pay | Source: Home / Self Care | Attending: Internal Medicine

## 2011-08-25 ENCOUNTER — Encounter (HOSPITAL_COMMUNITY): Payer: Self-pay | Admitting: *Deleted

## 2011-08-25 ENCOUNTER — Inpatient Hospital Stay (HOSPITAL_COMMUNITY): Payer: Medicare Other | Admitting: Anesthesiology

## 2011-08-25 ENCOUNTER — Encounter (HOSPITAL_COMMUNITY): Payer: Self-pay | Admitting: Anesthesiology

## 2011-08-25 HISTORY — PX: TRANSURETHRAL RESECTION OF BLADDER TUMOR: SHX2575

## 2011-08-25 LAB — BASIC METABOLIC PANEL
BUN: 17 mg/dL (ref 6–23)
Calcium: 9.8 mg/dL (ref 8.4–10.5)
Creatinine, Ser: 1.11 mg/dL (ref 0.50–1.35)
GFR calc non Af Amer: 67 mL/min — ABNORMAL LOW (ref >90)
Glucose, Bld: 115 mg/dL — ABNORMAL HIGH (ref 70–99)

## 2011-08-25 SURGERY — TURBT (TRANSURETHRAL RESECTION OF BLADDER TUMOR)
Anesthesia: Spinal | Site: Urethra | Wound class: Clean Contaminated

## 2011-08-25 MED ORDER — FENTANYL CITRATE 0.05 MG/ML IJ SOLN
INTRAMUSCULAR | Status: DC | PRN
  Administered 2011-08-25 (×2): 25 ug via INTRAVENOUS
  Administered 2011-08-25: 50 ug via INTRAVENOUS

## 2011-08-25 MED ORDER — FENTANYL CITRATE 0.05 MG/ML IJ SOLN: 25.0000 ug | INTRAMUSCULAR | Status: DC | PRN

## 2011-08-25 MED ORDER — ONDANSETRON HCL 4 MG/2ML IJ SOLN: 4.0000 mg | Freq: Once | INTRAMUSCULAR | Status: AC | PRN

## 2011-08-25 MED ORDER — METOPROLOL TARTRATE 1 MG/ML IV SOLN
5.0000 mg | Freq: Once | INTRAVENOUS | Status: AC
  Administered 2011-08-25: 5 mg via INTRAVENOUS

## 2011-08-25 MED ORDER — BUPIVACAINE HCL 0.75 % IJ SOLN
INTRAMUSCULAR | Status: DC | PRN
  Administered 2011-08-25: 15 mg via INTRATHECAL

## 2011-08-25 MED ORDER — SODIUM CHLORIDE 0.9 % IJ SOLN
INTRAMUSCULAR | Status: AC
  Administered 2011-08-25: 10 mL
  Filled 2011-08-25: qty 3

## 2011-08-25 MED ORDER — METOPROLOL TARTRATE 1 MG/ML IV SOLN
INTRAVENOUS | Status: AC
  Filled 2011-08-25: qty 5

## 2011-08-25 MED ORDER — PROPOFOL 10 MG/ML IV EMUL
INTRAVENOUS | Status: AC
  Filled 2011-08-25: qty 20

## 2011-08-25 MED ORDER — PHENYLEPHRINE HCL 10 MG/ML IJ SOLN
INTRAMUSCULAR | Status: DC | PRN
  Administered 2011-08-25 (×2): 100 ug via INTRAVENOUS
  Administered 2011-08-25: 50 ug via INTRAVENOUS

## 2011-08-25 MED ORDER — FENTANYL CITRATE 0.05 MG/ML IJ SOLN
INTRAMUSCULAR | Status: AC
  Filled 2011-08-25: qty 2

## 2011-08-25 MED ORDER — PHENYLEPHRINE HCL 10 MG/ML IJ SOLN
INTRAMUSCULAR | Status: AC
  Filled 2011-08-25: qty 1

## 2011-08-25 MED ORDER — EPHEDRINE SULFATE 50 MG/ML IJ SOLN
INTRAMUSCULAR | Status: AC
  Filled 2011-08-25: qty 1

## 2011-08-25 MED ORDER — DILTIAZEM HCL ER COATED BEADS 180 MG PO CP24
360.0000 mg | ORAL_CAPSULE | Freq: Every day | ORAL | Status: DC
  Administered 2011-08-25 – 2011-08-26 (×2): 360 mg via ORAL
  Filled 2011-08-25 (×2): qty 1
  Filled 2011-08-25 (×2): qty 2

## 2011-08-25 MED ORDER — STERILE WATER FOR IRRIGATION IR SOLN
Status: DC | PRN
  Administered 2011-08-25: 1000 mL

## 2011-08-25 MED ORDER — GLYCINE 1.5 % IR SOLN
Status: DC | PRN
  Administered 2011-08-25 (×2): 3000 mL

## 2011-08-25 MED ORDER — BUPIVACAINE IN DEXTROSE 0.75-8.25 % IT SOLN
INTRATHECAL | Status: AC
  Filled 2011-08-25: qty 2

## 2011-08-25 MED ORDER — MIDAZOLAM HCL 2 MG/2ML IJ SOLN
1.0000 mg | INTRAMUSCULAR | Status: DC | PRN
  Administered 2011-08-25: 2 mg via INTRAVENOUS

## 2011-08-25 MED ORDER — PROPOFOL 10 MG/ML IV EMUL
INTRAVENOUS | Status: DC | PRN
  Administered 2011-08-25: 25 ug/kg/min via INTRAVENOUS

## 2011-08-25 MED ORDER — LACTATED RINGERS IV SOLN
INTRAVENOUS | Status: DC
Start: 2011-08-25 — End: 2011-08-25
  Administered 2011-08-25: 1000 mL via INTRAVENOUS

## 2011-08-25 MED ORDER — MIDAZOLAM HCL 2 MG/2ML IJ SOLN
INTRAMUSCULAR | Status: AC
  Filled 2011-08-25: qty 2

## 2011-08-25 MED ORDER — SODIUM CHLORIDE 0.45 % IV BOLUS: 250.0000 mL | Freq: Once | INTRAVENOUS | Status: DC

## 2011-08-25 SURGICAL SUPPLY — 30 items
BAG DECANTER FOR FLEXI CONT (MISCELLANEOUS) ×2 IMPLANT
BAG DRAIN URO TABLE W/ADPT NS (DRAPE) ×2 IMPLANT
BAG URINE DRAINAGE (UROLOGICAL SUPPLIES) ×2 IMPLANT
CABLE HI FREQUENCY MONOPOLAR (ELECTROSURGICAL) ×2 IMPLANT
CATH FOLEY 2WAY SLVR  5CC 20FR (CATHETERS) ×1
CATH FOLEY 2WAY SLVR  5CC 22FR (CATHETERS) ×1
CATH FOLEY 2WAY SLVR 5CC 20FR (CATHETERS) ×1 IMPLANT
CATH FOLEY 2WAY SLVR 5CC 22FR (CATHETERS) ×1 IMPLANT
CLOTH BEACON ORANGE TIMEOUT ST (SAFETY) ×2 IMPLANT
CONNECTOR 5 IN 1 STRAIGHT STRL (MISCELLANEOUS) ×2 IMPLANT
ELECT CUT LOOP C-MAX 27FR .012 (CUTTING LOOP) ×2
ELECTRODE CUT LP CMX 27FR .012 (CUTTING LOOP) ×1 IMPLANT
FORMALIN 10 PREFIL 120ML (MISCELLANEOUS) IMPLANT
GLOVE BIO SURGEON STRL SZ7 (GLOVE) ×2 IMPLANT
GLOVE ECLIPSE 6.5 STRL STRAW (GLOVE) ×2 IMPLANT
GLOVE EXAM NITRILE MD LF STRL (GLOVE) ×2 IMPLANT
GLOVE INDICATOR 7.0 STRL GRN (GLOVE) ×4 IMPLANT
GLYCINE 1.5% IRRIG UROMATIC (IV SOLUTION) ×8 IMPLANT
GOWN STRL REIN XL XLG (GOWN DISPOSABLE) ×2 IMPLANT
IV NS IRRIG 3000ML ARTHROMATIC (IV SOLUTION) ×2 IMPLANT
KIT ROOM TURNOVER AP CYSTO (KITS) ×2 IMPLANT
KNIFE DISP COLD (ELECTROSURGICAL) ×2 IMPLANT
MANIFOLD NEPTUNE II (INSTRUMENTS) ×2 IMPLANT
PACK CYSTO (CUSTOM PROCEDURE TRAY) ×2 IMPLANT
PAD ARMBOARD 7.5X6 YLW CONV (MISCELLANEOUS) ×2 IMPLANT
PAD TELFA 3X4 1S STER (GAUZE/BANDAGES/DRESSINGS) IMPLANT
SET IRRIGATING DISP (SET/KITS/TRAYS/PACK) ×2 IMPLANT
SYR 30ML LL (SYRINGE) ×2 IMPLANT
TOWEL OR 17X26 4PK STRL BLUE (TOWEL DISPOSABLE) ×2 IMPLANT
YANKAUER SUCT BULB TIP 10FT TU (MISCELLANEOUS) ×2 IMPLANT

## 2011-08-25 NOTE — Anesthesia Preprocedure Evaluation (Addendum)
Anesthesia Evaluation  Patient identified by MRN, date of birth, ID band Patient awake    Reviewed: Allergy & Precautions, H&P , NPO status , Patient's Chart, lab work & pertinent test results  Airway Mallampati: III TM Distance: >3 FB Neck ROM: Full    Dental  (+) Edentulous Upper and Partial Lower   Pulmonary pneumonia , COPD oxygen dependent, Current Smoker (>60 py hx),  breath sounds clear to auscultation        Cardiovascular hypertension, Pt. on medications + Past MI, +CHF, + Orthopnea and + DOE + dysrhythmias Atrial Fibrillation Rhythm:Irregular Rate:Tachycardia     Neuro/Psych PSYCHIATRIC DISORDERS Anxiety Depression    GI/Hepatic   Endo/Other    Renal/GU      Musculoskeletal   Abdominal   Peds  Hematology   Anesthesia Other Findings   Reproductive/Obstetrics                         Anesthesia Physical Anesthesia Plan  ASA: III  Anesthesia Plan: Spinal   Post-op Pain Management:    Induction:   Airway Management Planned: Nasal Cannula  Additional Equipment:   Intra-op Plan:   Post-operative Plan:   Informed Consent: I have reviewed the patients History and Physical, chart, labs and discussed the procedure including the risks, benefits and alternatives for the proposed anesthesia with the patient or authorized representative who has indicated his/her understanding and acceptance.     Plan Discussed with:   Anesthesia Plan Comments:         Anesthesia Quick Evaluation

## 2011-08-25 NOTE — Progress Notes (Addendum)
Primary cardiologist: Dr. Wilford Grist at [facility]  Subjective: Breathing better, no chest pain. Still feels palpitations.   Objective: Temp:  [97.8 F (36.6 C)-98.1 F (36.7 C)] 98.1 F (36.7 C) (04/29 0643) Pulse Rate:  [69-121] 121  (04/29 0643) Resp:  [18-24] 20  (04/29 0643) BP: (108-138)/(50-96) 138/96 mmHg (04/29 0643) SpO2:  [91 %-95 %] 93 % (04/29 0804) Weight:  [216 lb (97.977 kg)] 216 lb (97.977 kg) (04/29 0641)  I/O last 3 completed shifts: In: 746 [P.O.:720; I.V.:26] Out: 4050 [Urine:4050]  Telemetry - Persistent atrial fibrillation.  Exam -   General - NAD.  Lungs - Diminished breath sounds, nonlabored.  Cardiac - Irregularly irregular, no S3.  Extremities - No pitting.  Testing -   Lab Results  Component Value Date   WBC 6.4 08/23/2011   HGB 11.9* 08/23/2011   HCT 38.3* 08/23/2011   MCV 91.8 08/23/2011   PLT 302 08/23/2011    Lab Results  Component Value Date   CREATININE 1.11 08/25/2011   BUN 17 08/25/2011   NA 139 08/25/2011   K 4.3 08/25/2011   CL 99 08/25/2011   CO2 35* 08/25/2011    Lab Results  Component Value Date   CKTOTAL 54 08/23/2011   CKMB 4.5* 08/23/2011   TROPONINI <0.30 08/23/2011    Current Medications    . antiseptic oral rinse  15 mL Mouth Rinse BID  . Chlorhexidine Gluconate Cloth  6 each Topical Q0600  . citalopram  40 mg Oral Daily  . digoxin  0.25 mg Oral Daily  . diltiazem  360 mg Oral Daily  . Fluticasone-Salmeterol  1 puff Inhalation Q12H  . furosemide  40 mg Oral BID  . heparin  5,000 Units Subcutaneous Q8H  . imatinib  400 mg Oral QPM  . levalbuterol  0.63 mg Nebulization Q6H  . lisinopril  40 mg Oral Daily  . metoprolol succinate  25 mg Oral Daily  . mupirocin ointment  1 application Nasal BID  . oxyCODONE  10 mg Oral Q12H  . potassium chloride  40 mEq Oral BID  . sodium chloride  3 mL Intravenous Q12H  . sodium chloride      . sodium chloride      . sodium chloride      . venlafaxine  75 mg Oral BID  .  DISCONTD: diltiazem  300 mg Oral Daily    Echocardiogram 4/26: Study Conclusions  - Left ventricle: The cavity size was moderately dilated. Wall thickness was increased increased in a pattern of mild to moderate LVH. Systolic function was severely reduced. The estimated ejection fraction was in the range of 25% to 30% in the setting of rapid atrial fibrillation. Diffuse hypokinesis, more prominent in the inferoposterior walls. The study is not technically sufficient to allow evaluation of LV diastolic function - atrial fibrillation present. - Aortic valve: Not well visualized. Probably trileaflet. Trivial regurgitation. - Aorta: Aortic root dimension: 46mm (ED). - Aortic root: The aortic root was moderately dilated. - Mitral valve: Trivial regurgitation. - Left atrium: The atrium was moderately dilated. - Right ventricle: Systolic function was mildly reduced. - Tricuspid valve: Mild regurgitation. - Inferior vena cava: The vessel was dilated. - Pericardium, extracardiac: A small pericardial effusion was identified posterior to the heart.   Assessment:  1. Persistent atrial fibrillation, initially with RVR, however heart rate coming under better control with additional medications. Has history of apparent paroxysmal atrial fibrillation, followed by Dr. Wilford Grist at [facility]. He was previously on Xarelto prior  to plan for urologic surgery.  2. Acute on chronic systolic heart failure, LVEF 25-30% by echocardiogram. Not certain whether ischemic or nonischemic. Patient indicates that he had a stress test done at [facility], and actually awaits followup to discuss the next step in his evaluation. Cardiac markers argue against acute coronary syndrome. He has had a good diuresis with decrease in weight, clinically improved.  3. Hematuria, history of prostate cancer. Followed by Dr. Jerre Simon. Should be able to proceed now if needed, with close followup.  Plan:  Patient off Xarelto for now in light of  hematuria and possible urologic surgery. Focus is on rate control, and he is currently on Cardizem CD 360 mg daily, digoxin 0.25 mg daily, and Toprol-XL at 25 mg daily - will advance dose further if needed. Continue Lasix and potassium supplementation for now.  Jonelle Sidle, M.D., F.A.C.C.

## 2011-08-25 NOTE — Anesthesia Postprocedure Evaluation (Addendum)
  Anesthesia Post-op Note  Patient: Mitchell [name]  Procedure(s) Performed: Procedure(s) (LRB): TRANSURETHRAL RESECTION OF BLADDER TUMOR (TURBT) (N/A)  Patient Location: PACU  Anesthesia Type: Spinal  Level of Consciousness: awake, alert  and oriented  Airway and Oxygen Therapy: Patient Spontanous Breathing and Patient connected to nasal cannula oxygen  Post-op Pain: none  Post-op Assessment: Post-op Vital signs reviewed, Patient's Cardiovascular Status Stable, Respiratory Function Stable, Patent Airway and No signs of Nausea or vomiting  Post-op Vital Signs: Reviewed and stable  Complications: No apparent anesthesia complications   Some continuing hypotension secondary to SAB.  80/54  Neo .  07/26/11  Patient discharged today.

## 2011-08-25 NOTE — Progress Notes (Signed)
Patient pulled out iv , leaving IV out since patient does not require iv medication at this time. Patient had pulled out IV 2 previous times

## 2011-08-25 NOTE — Brief Op Note (Signed)
08/21/2011 - 08/25/2011  3:00 PM  PATIENT:  Mitchell [name]  66 y.o. male  PRE-OPERATIVE DIAGNOSIS:  difficulty urniating, bladder neck stenosis  POST-OPERATIVE DIAGNOSIS:  difficulty urniating, bladder neck stenosis  PROCEDURE:  Procedure(s) (LRB): TRANSURETHRAL RESECTION OF BLADDER TUMOR (TURBT) (N/A)  SURGEON:  Surgeon(s) and Role:    * Ky Barban, MD - Primary  PHYSICIAN ASSISTANT:   ASSISTANTS: none   ANESTHESIA:   spinal  EBL:  Total I/O In: 300 [I.V.:300] Out: 300 [Urine:300]  BLOOD ADMINISTERED:none  DRAINS: Urinary Catheter (Foley)   LOCAL MEDICATIONS USED:  NONE  SPECIMEN:  Source of Specimen:  bladder neck chips  DISPOSITION OF SPECIMEN:  PATHOLOGY  COUNTS:  YES  TOURNIQUET:  * No tourniquets in log *  DICTATION: .Other Dictation: Dictation Number [id number]  PLAN OF CARE: Admit for overnight observation  PATIENT DISPOSITION:  PACU - hemodynamically stable.   Delay start of Pharmacological VTE agent (>24hrs) due to surgical blood loss or risk of bleeding:

## 2011-08-25 NOTE — Anesthesia Procedure Notes (Signed)
Spinal  Patient location during procedure: OR Start time: 08/25/2011 2:20 PM Staffing Anesthesiologist: Laurene Footman CRNA/Resident: Glynn Octave E Preanesthetic Checklist Completed: patient identified, site marked, surgical consent, pre-op evaluation, timeout performed, IV checked, risks and benefits discussed and monitors and equipment checked Spinal Block Patient position: left lateral decubitus Prep: Betadine Patient monitoring: heart rate, cardiac monitor, continuous pulse ox and blood pressure Approach: left paramedian Location: L3-4 Injection technique: single-shot Needle Needle type: Spinocan  Needle gauge: 22 G Needle length: 9 cm Assessment Sensory level: T8 Additional Notes  ATTEMPTS:2 TRAY WG:[id number] TRAY EXPIRATION DATE:02-2012  Attempted SAB by CRNA unsuccessful and Dr. Jayme Cloud in.  One attempt, level T8.

## 2011-08-25 NOTE — Progress Notes (Signed)
Subjective: This man remains somewhat intermittently confused. His ventricular rate is greatly improved. He feels better and is less short of breath.           Physical Exam: Blood pressure 138/96, pulse 121, temperature 98.1 F (36.7 C), temperature source Oral, resp. rate 20, height 6' (1.829 m), weight 97.977 kg (216 lb), SpO2 93.00%. There is no increased work of breathing. There is no peripheral central cyanosis. Heart sounds are present and in atrial fibrillation with a controlled ventricular rate. Lung fields are entirely clear. There is no peripheral pitting edema in his legs whatsoever. He is alert, somewhat forgetful of events around him.  Investigations:  Recent Results (from the past 240 hour(s))  URINE CULTURE     Status: Normal   Collection Time   08/21/11 12:12 PM      Component Value Range Status Comment   Specimen Description URINE, CLEAN CATCH   Final    Special Requests NONE   Final    Culture  Setup Time [id number]   Final    Colony Count NO GROWTH   Final    Culture NO GROWTH   Final    Report Status 08/22/2011 FINAL   Final   MRSA PCR SCREENING     Status: Abnormal   Collection Time   08/21/11  7:48 PM      Component Value Range Status Comment   MRSA by PCR POSITIVE (*) NEGATIVE  Final      Basic Metabolic Panel:  Basename 08/25/11 0537 08/24/11 0416  NA 139 140  K 4.3 3.8  CL 99 100  CO2 35* 36*  GLUCOSE 115* 112*  BUN 17 14  CREATININE 1.11 1.02  CALCIUM 9.8 9.5  MG -- --  PHOS -- --       CBC:  Basename 08/23/11 0745  WBC 6.4  NEUTROABS --  HGB 11.9*  HCT 38.3*  MCV 91.8  PLT 302    No results found.    Medications:  Scheduled:    . antiseptic oral rinse  15 mL Mouth Rinse BID  . Chlorhexidine Gluconate Cloth  6 each Topical Q0600  . citalopram  40 mg Oral Daily  . digoxin  0.25 mg Oral Daily  . diltiazem  300 mg Oral Daily  . Fluticasone-Salmeterol  1 puff Inhalation Q12H  . furosemide  40 mg Oral BID  .  heparin  5,000 Units Subcutaneous Q8H  . imatinib  400 mg Oral QPM  . levalbuterol  0.63 mg Nebulization Q6H  . lisinopril  40 mg Oral Daily  . metoprolol succinate  25 mg Oral Daily  . mupirocin ointment  1 application Nasal BID  . oxyCODONE  10 mg Oral Q12H  . potassium chloride  40 mEq Oral BID  . sodium chloride  3 mL Intravenous Q12H  . sodium chloride      . sodium chloride      . sodium chloride      . venlafaxine  75 mg Oral BID    Impression: 1. Congestive heart failure, compensated. 2. Atrial fibrillation with controlled ventricular rate. 3. Hypertension. 4. CLL. 5. History prostate cancer.      Plan: 1. Increase Cardizem CD to 360 mg daily. 2. Contact Dr. Jerre Simon to see if patient can undergo TUR bladder neck, which had previously been planned.     LOS: 4 days   Wilson Singer Pager [phone]  08/25/2011, 8:13 AM

## 2011-08-25 NOTE — Transfer of Care (Signed)
Immediate Anesthesia Transfer of Care Note  Patient: Mitchell [name]  Procedure(s) Performed: Procedure(s) (LRB): TRANSURETHRAL RESECTION OF BLADDER TUMOR (TURBT) (N/A)  Patient Location: PACU  Anesthesia Type: Spinal  Level of Consciousness: awake, alert  and oriented  Airway & Oxygen Therapy: Patient Spontanous Breathing  Post-op Assessment: Report given to PACU RN  Post vital signs: Reviewed and stable  Complications: No apparent anesthesia complications

## 2011-08-25 NOTE — Transfer of Care (Signed)
Immediate Anesthesia Transfer of Care Note  Patient: Mitchell [name]  Procedure(s) Performed: Procedure(s) (LRB): TRANSURETHRAL RESECTION OF BLADDER TUMOR (TURBT) (N/A)  Patient Location: PACU  Anesthesia Type: Spinal  Level of Consciousness: awake, alert  and oriented  Airway & Oxygen Therapy: Patient Spontanous Breathing and Patient connected to nasal cannula oxygen  Post-op Assessment: Report given to PACU RN  Post vital signs: Reviewed and stable  Complications: No apparent anesthesia complications

## 2011-08-25 NOTE — Progress Notes (Signed)
   CARE MANAGEMENT NOTE 08/25/2011  Patient:  Mitchell [name], Mitchell [name]   Account Number:  [id number]  Date Initiated:  08/25/2011  Documentation initiated by:  Rosemary Holms  Subjective/Objective Assessment:   pt admitted from home alone. Pt in atrial fib. and needed diureses. Pt to also have a cardiac work up.     Action/Plan:   When cleared by cardiology, will plan to proceed with TURP. Taken for TURP today. Will assess PO needs for DC.   Anticipated DC Date:  08/27/2011   Anticipated DC Plan:  HOME W HOME HEALTH SERVICES      DC Planning Services  CM consult      Choice offered to / List presented to:             Status of service:  In process, will continue to follow Medicare Important Message given?   (If response is "NO", the following Medicare IM given date fields will be blank) Date Medicare IM given:   Date Additional Medicare IM given:    Discharge Disposition:    Per UR Regulation:    If discussed at Long Length of Stay Meetings, dates discussed:    Comments:  08/25/11 1300 Bright Spielmann Leanord Hawking RN BSN CM

## 2011-08-26 LAB — BASIC METABOLIC PANEL
BUN: 20 mg/dL (ref 6–23)
Calcium: 9.4 mg/dL (ref 8.4–10.5)
Creatinine, Ser: 1.24 mg/dL (ref 0.50–1.35)
GFR calc Af Amer: 68 mL/min — ABNORMAL LOW (ref >90)
GFR calc non Af Amer: 59 mL/min — ABNORMAL LOW (ref >90)

## 2011-08-26 MED ORDER — POTASSIUM CHLORIDE 10 MEQ PO TBCR: 40.0000 meq | EXTENDED_RELEASE_TABLET | Freq: Two times a day (BID) | ORAL | Status: DC

## 2011-08-26 MED ORDER — DIGOXIN 250 MCG PO TABS: 0.2500 mg | ORAL_TABLET | Freq: Every day | ORAL | Status: DC

## 2011-08-26 MED ORDER — FUROSEMIDE 40 MG PO TABS: 40.0000 mg | ORAL_TABLET | Freq: Two times a day (BID) | ORAL | Status: DC

## 2011-08-26 MED ORDER — DILTIAZEM HCL ER COATED BEADS 360 MG PO CP24: 360.0000 mg | ORAL_CAPSULE | Freq: Every day | ORAL | Status: DC

## 2011-08-26 NOTE — Discharge Summary (Signed)
Physician Discharge Summary  Patient ID: MOUSTAFA MOSSA MRN: [id number] DOB/AGE: 05-25-1945 66 y.o. Primary Care Physician:HASANAJ,XAJE A, MD, MD Admit date: 08/21/2011 Discharge date: 08/26/2011    Discharge Diagnoses:  1. Systolic congestive heart failure, ejection fraction 25-30%. 2. Atrial fibrillation with rapid ventricular response, rate controlled now. 3. Chronic anticoagulation with xeralto. 4. Status post TUR bladder neck. 5. Hypertension. 6. CLL. 7. History of prostate cancer. 8. Obesity.   Medication List  As of 08/26/2011  9:53 AM   STOP taking these medications         HYDROcodone-acetaminophen 10-500 MG per tablet      theophylline 300 MG 12 hr tablet         TAKE these medications         ADVAIR DISKUS 250-50 MCG/DOSE Aepb   Generic drug: Fluticasone-Salmeterol   Inhale 1 puff into the lungs every 12 (twelve) hours.      albuterol 108 (90 BASE) MCG/ACT inhaler   Commonly known as: PROVENTIL HFA;VENTOLIN HFA   Inhale 2 puffs into the lungs every 6 (six) hours as needed. For shortness of breath      ALPRAZolam 1 MG tablet   Commonly known as: XANAX   Take 1 mg by mouth at bedtime as needed. For sleep      citalopram 40 MG tablet   Commonly known as: CELEXA   Take 40 mg by mouth daily.      digoxin 0.25 MG tablet   Commonly known as: LANOXIN   Take 1 tablet (0.25 mg total) by mouth daily.      diltiazem 360 MG 24 hr capsule   Commonly known as: CARDIZEM CD   Take 1 capsule (360 mg total) by mouth daily.      docusate sodium 100 MG capsule   Commonly known as: COLACE   Take 100 mg by mouth as needed. For constipation      furosemide 40 MG tablet   Commonly known as: LASIX   Take 1 tablet (40 mg total) by mouth 2 (two) times daily.      imatinib 400 MG tablet   Commonly known as: GLEEVEC   Take 400 mg by mouth every evening. Take with meals and large glass of water.Caution:Chemotherapy.      lisinopril 40 MG tablet   Commonly known as:  PRINIVIL,ZESTRIL   Take 40 mg by mouth daily.      LUPRON IJ   Inject 1 each as directed every 4 (four) months.      metoprolol succinate 25 MG 24 hr tablet   Commonly known as: TOPROL-XL   Take 25 mg by mouth daily.      mometasone 0.1 % cream   Commonly known as: ELOCON   Apply 1 application topically daily as needed. Skin lesions      oxyCODONE 40 MG 12 hr tablet   Commonly known as: OXYCONTIN   Take 40 mg by mouth every 12 (twelve) hours.      oxycodone 5 MG capsule   Commonly known as: OXY-IR   Take 5 mg by mouth every 4 (four) hours as needed. For breakthrough pain      potassium chloride 10 MEQ CR tablet   Commonly known as: KLOR-CON   Take 4 tablets (40 mEq total) by mouth 2 (two) times daily.      venlafaxine 75 MG tablet   Commonly known as: EFFEXOR   Take 75 mg by mouth 2 (two) times daily.  XARELTO 20 MG Tabs   Generic drug: Rivaroxaban   Take 1 tablet by mouth daily.      zolpidem 10 MG tablet   Commonly known as: AMBIEN   Take 10 mg by mouth at bedtime.            Discharged Condition: Stable.    Consults: Dr. Jerre Simon, urology. Dr. Diona Browner, cardiology.  Significant Diagnostic Studies: Dg Chest Port 1 View  08/23/2011  *RADIOLOGY REPORT*  Clinical Data: Shortness of breath and wheezing.  Atrial fibrillation with rapid ventricular response.  PORTABLE CHEST - 1 VIEW  Comparison: 08/22/2011  Findings: Stable cardiomegaly.  No gross pulmonary edema.  Stable scattered atelectasis present in both lungs.  No significant pleural effusions.  IMPRESSION: Stable cardiomegaly and bilateral atelectasis.  Original Report Authenticated By: Reola Calkins, M.D.   Dg Chest Port 1 View  08/22/2011  *RADIOLOGY REPORT*  Clinical Data: Follow-up congestive heart failure  PORTABLE CHEST - 1 VIEW  Comparison: 08/21/2011  Findings: Marked cardiomegaly is stable in appearance.  A stable mediastinal configuration is seen.  There is pulmonary vascular apical  redistribution with vessels appearing well delineated. Stable scarring is seen in the left midlung zone.  Mild density at both lung bases left greater than right is unchanged and may reflect residual basilar edema or pneumonia.  Little interval changes noted in comparison with the prior exam with no definite pleural fluid seen.  IMPRESSION: Cardiomegaly with unchanged bibasilar air space disease questionably reflecting basilar edema fluid or pneumonia.  Original Report Authenticated By: Bertha Stakes, M.D.   Dg Chest Portable 1 View  08/21/2011  *RADIOLOGY REPORT*  Clinical Data: Shortness of breath.  History of prostate and colon cancer.  PORTABLE CHEST - 1 VIEW  Comparison: PA and lateral chest 01/30/2011.  CT chest 04/13/2009.  Findings: There is marked cardiomegaly.  Lungs are emphysematous. Bibasilar airspace disease is present and worse on the left.  IMPRESSION: Cardiomegaly left worse right basilar airspace disease which could reflect asymmetric edema or pneumonia.  Original Report Authenticated By: Bernadene Bell. Maricela Curet, M.D.   Echocardiogram without contrast      Ordering Physician:  Wilson Singer, MD  Order# [id number]  Study Date:  08/22/11      Patient Information      Name  MRN   Description     BRAYLN DUQUE  [id number]   66 year old Male              Result Narrative     *[facility]* [address] [place], [place] [address] [phone]  ------------------------------------------------------------ Transthoracic Echocardiography  Patient: Aryon, Nham MR #: [id number] Study Date: 08/22/2011 Gender: M Age: 66 Height: 182.9cm Weight: 108kg BSA: 2.66m^2 Pt. Status: Room: APA06  SONOGRAPHER Karrie Doffing ATTENDING Donnetta Hutching Oneita Jolly, Berlene Dixson C REFERRING [place], [place] C PERFORMING Delma Freeze Penn cc:  ------------------------------------------------------------ LV EF: 25% -  30%  ------------------------------------------------------------ Indications: CHF - 428.0.  ------------------------------------------------------------ History: PMH: Atrial fibrillation. PMH: Bronchitis Myocardial infarction. Risk factors: Current tobacco use. Hypertension.  ------------------------------------------------------------ Study Conclusions  - Left ventricle: The cavity size was moderately dilated. Wall thickness was increased increased in a pattern of mild to moderate LVH. Systolic function was severely reduced. The estimated ejection fraction was in the range of 25% to 30% in the setting of rapid atrial fibrillation. Diffuse hypokinesis, more prominent in the inferoposterior walls. The study is not technically sufficient to allow evaluation of LV diastolic function - atrial fibrillation present. -  Aortic valve: Not well visualized. Probably trileaflet. Trivial regurgitation. - Aorta: Aortic root dimension: 46mm (ED). - Aortic root: The aortic root was moderately dilated. - Mitral valve: Trivial regurgitation. - Left atrium: The atrium was moderately dilated. - Right ventricle: Systolic function was mildly reduced. - Tricuspid valve: Mild regurgitation. - Inferior vena cava: The vessel was dilated. - Pericardium, extracardiac: A small pericardial effusion was identified posterior to the heart. Transthoracic echocardiography. M-mode, complete 2D, spectral Doppler, and color Doppler. Height: Height: 182.9cm. Height: 72in. Weight: Weight: 108kg. Weight: 237.5lb. Body mass index: BMI: 32.3kg/m^2. Body surface area: BSA: 2.56m^2. Patient status: Inpatient. Location: ICU/CCU      Lab Results: Basic Metabolic Panel:  Basename 08/26/11 0556 08/25/11 0537  NA 142 139  K 4.4 4.3  CL 101 99  CO2 37* 35*  GLUCOSE 110* 115*  BUN 20 17  CREATININE 1.24 1.11  CALCIUM 9.4 9.8  MG -- --  PHOS -- --   Liver Function Tests:    CBC:   Recent Results (from  the past 240 hour(s))  URINE CULTURE     Status: Normal   Collection Time   08/21/11 12:12 PM      Component Value Range Status Comment   Specimen Description URINE, CLEAN CATCH   Final    Special Requests NONE   Final    Culture  Setup Time [id number]   Final    Colony Count NO GROWTH   Final    Culture NO GROWTH   Final    Report Status 08/22/2011 FINAL   Final   MRSA PCR SCREENING     Status: Abnormal   Collection Time   08/21/11  7:48 PM      Component Value Range Status Comment   MRSA by PCR POSITIVE (*) NEGATIVE  Final      Hospital Course: This 66 year old man was admitted with symptoms of dyspnea, palpitations and chest pain. He was due to have cystoscopy and TUR of the bladder neck on the morning of the admission when he started to have the above symptoms. When he was seen in the emergency room he was found to be in atrial fibrillation with rapid ventricular response and a chest x-ray consistent with congestive heart failure. He was therefore appropriately treated with intravenous diuretics, Cardizem drip. This seemed to improve his condition. He did have an echocardiogram which showed ejection fraction of only 25-30% with diffuse hypokinesis. He apparently is getting workup [facility] his heart at [facility]. In view of his reduced ejection fraction and atrial fibrillation, he was also started on digoxin. He's done well since this time and his xeralto had been discontinued in view of the upcoming surgery. He underwent TUR bladder neck yesterday and has done well postoperatively. There is no bleeding in the catheter. His breathing is good. He is keen to go home.  Discharge Exam: Blood pressure 147/93, pulse 111, temperature 98.3 F (36.8 C), temperature source Oral, resp. rate 16, height 6' (1.829 m), weight 97.977 kg (216 lb), SpO2 93.00%. He looks systemically well. Heart sounds are present and in atrial fibrillation. Lung fields are entirely clear. Abdomen is soft and nontender. He has a Foley  catheter in situ which is draining clear looking urine. He is alert and orientated.  Disposition: Home. He'll follow with [facility] regarding his heart. His xeralto can be restarted. Dr. Jerre Simon wants  his Foley cath showed to be taken out. Dr. Jerre Simon will see him in the office in approximately one week.  Discharge  Orders    Future Orders Please Complete By Expires   Diet - low sodium heart healthy      Increase activity slowly         Follow-up Information    Follow up with Alleen Borne I, MD. Schedule an appointment as soon as possible for a visit in 1 week. (NEXT MON)    Contact information:   [address] [place] [address] [phone]          Signed: Wilson Singer Pager [phone]  08/26/2011, 9:53 AM

## 2011-08-26 NOTE — Progress Notes (Signed)
   CARE MANAGEMENT NOTE 08/26/2011  Patient:  Mitchell [name], Mitchell [name]   Account Number:  [id number]  Date Initiated:  08/25/2011  Documentation initiated by:  Rosemary Holms  Subjective/Objective Assessment:   pt admitted from home alone. Pt in atrial fib. and needed diureses. Pt to also have a cardiac work up.     Action/Plan:   When cleared by cardiology, will plan to proceed with TURP. Taken for TURP today. Will assess PO needs for DC.   Anticipated DC Date:  08/27/2011   Anticipated DC Plan:  HOME W HOME HEALTH SERVICES      DC Planning Services  CM consult      Choice offered to / List presented to:             Status of service:  Completed, signed off Medicare Important Message given?   (If response is "NO", the following Medicare IM given date fields will be blank) Date Medicare IM given:   Date Additional Medicare IM given:    Discharge Disposition:  HOME/SELF CARE  Per UR Regulation:    If discussed at Long Length of Stay Meetings, dates discussed:    Comments:  08/26/11 1000 Abigaile Rossie RN BSN Cm Spoke to wife at bedside. She does not believe she needs HH at this time. Will f/u with Surgeon as instructed  08/25/11 1300 Glyn Zendejas Leanord Hawking RN BSN CM

## 2011-08-26 NOTE — Op Note (Signed)
NAME:  Mitchell [name], Mitchell [name] NO.:  [id number]  MEDICAL RECORD NO.:  [id number]  LOCATION:                                 FACILITY:  PHYSICIAN:  Ky Barban, M.D.DATE OF BIRTH:  [birth year]  DATE OF PROCEDURE:  08/25/2011 DATE OF DISCHARGE:                              OPERATIVE REPORT   SURGEON:  Ky Barban, MD  PREOPERATIVE DIAGNOSES: 1. Status post radical prostatectomy for prostate cancer. 2. Bladder neck stenosis.  PROCEDURE:  TUR, bladder neck resection.  PROCEDURE:  The patient under spinal anesthesia in lithotomy position. Usual prep and drape, the bladder neck is dilated to 30-French with Sissy Hoff sounds.  The resectoscope #28 was introduced into the bladder. Bladder was inspected, looked normal.  Now, the bladder neck was circumferentially resected and wide open bladder neck was obtained. After removing the chips, there was a big clip in the bladder neck at 3 o'clock position, which was also removed.  After removing all of the chips, resectoscope was removed.  A #20 Foley catheter left in for drainage.  The patient did not have any bleeding.  He left the operating room in satisfactory condition.     Ky Barban, M.D.     MIJ/MEDQ  D:  08/25/2011  T:  08/26/2011  Job:  [id number]

## 2011-08-26 NOTE — Addendum Note (Signed)
Addendum  created 08/26/11 1622 by Moshe Salisbury, CRNA   Modules edited:Notes Section

## 2011-08-26 NOTE — Progress Notes (Signed)
Patient discharged home via family; Pt and Pt wife given and explained discharge instructions, prescriptions with drug information pt education sheets for each prescriptions, carenotes; stated understanding and denied questions; pts foley removed without problems with post removal void x 2; pt stable at time of discharge left unit via wheelchair and RN to pt car.

## 2011-08-27 ENCOUNTER — Encounter (HOSPITAL_COMMUNITY): Payer: Self-pay | Admitting: Urology

## 2011-09-25 ENCOUNTER — Other Ambulatory Visit: Payer: Self-pay | Admitting: Internal Medicine

## 2011-12-26 ENCOUNTER — Encounter (HOSPITAL_COMMUNITY)
Admission: RE | Admit: 2011-12-26 | Discharge: 2011-12-26 | Disposition: A | Discharge status: Home / Self Care | Payer: Medicare Other | Source: Ambulatory Visit | Attending: Urology | Admitting: Urology

## 2011-12-26 ENCOUNTER — Other Ambulatory Visit: Payer: Self-pay

## 2011-12-26 ENCOUNTER — Emergency Department (HOSPITAL_COMMUNITY): Payer: Medicare Other

## 2011-12-26 ENCOUNTER — Observation Stay (HOSPITAL_COMMUNITY)
Admission: EM | Admit: 2011-12-26 | Discharge: 2011-12-27 | DRG: 669 | Disposition: A | Discharge status: Home / Self Care | Payer: Medicare Other | Attending: Internal Medicine | Admitting: Internal Medicine

## 2011-12-26 ENCOUNTER — Encounter (HOSPITAL_COMMUNITY): Payer: Self-pay | Admitting: *Deleted

## 2011-12-26 DIAGNOSIS — F17200 Nicotine dependence, unspecified, uncomplicated: Secondary | ICD-10-CM | POA: Insufficient documentation

## 2011-12-26 DIAGNOSIS — Z87442 Personal history of urinary calculi: Secondary | ICD-10-CM | POA: Insufficient documentation

## 2011-12-26 DIAGNOSIS — C9110 Chronic lymphocytic leukemia of B-cell type not having achieved remission: Secondary | ICD-10-CM | POA: Diagnosis present

## 2011-12-26 DIAGNOSIS — I4891 Unspecified atrial fibrillation: Secondary | ICD-10-CM | POA: Diagnosis present

## 2011-12-26 DIAGNOSIS — K5900 Constipation, unspecified: Secondary | ICD-10-CM

## 2011-12-26 DIAGNOSIS — Z8639 Personal history of other endocrine, nutritional and metabolic disease: Secondary | ICD-10-CM

## 2011-12-26 DIAGNOSIS — I499 Cardiac arrhythmia, unspecified: Secondary | ICD-10-CM | POA: Insufficient documentation

## 2011-12-26 DIAGNOSIS — Z8546 Personal history of malignant neoplasm of prostate: Secondary | ICD-10-CM

## 2011-12-26 DIAGNOSIS — N390 Urinary tract infection, site not specified: Secondary | ICD-10-CM | POA: Diagnosis present

## 2011-12-26 DIAGNOSIS — J40 Bronchitis, not specified as acute or chronic: Secondary | ICD-10-CM

## 2011-12-26 DIAGNOSIS — Z85038 Personal history of other malignant neoplasm of large intestine: Secondary | ICD-10-CM | POA: Insufficient documentation

## 2011-12-26 DIAGNOSIS — F3289 Other specified depressive episodes: Secondary | ICD-10-CM

## 2011-12-26 DIAGNOSIS — N320 Bladder-neck obstruction: Principal | ICD-10-CM | POA: Insufficient documentation

## 2011-12-26 DIAGNOSIS — I509 Heart failure, unspecified: Secondary | ICD-10-CM

## 2011-12-26 DIAGNOSIS — K589 Irritable bowel syndrome without diarrhea: Secondary | ICD-10-CM

## 2011-12-26 DIAGNOSIS — I252 Old myocardial infarction: Secondary | ICD-10-CM

## 2011-12-26 DIAGNOSIS — F329 Major depressive disorder, single episode, unspecified: Secondary | ICD-10-CM | POA: Insufficient documentation

## 2011-12-26 DIAGNOSIS — F411 Generalized anxiety disorder: Secondary | ICD-10-CM

## 2011-12-26 DIAGNOSIS — R32 Unspecified urinary incontinence: Secondary | ICD-10-CM

## 2011-12-26 DIAGNOSIS — R338 Other retention of urine: Secondary | ICD-10-CM | POA: Diagnosis present

## 2011-12-26 DIAGNOSIS — I10 Essential (primary) hypertension: Secondary | ICD-10-CM | POA: Diagnosis present

## 2011-12-26 DIAGNOSIS — E669 Obesity, unspecified: Secondary | ICD-10-CM

## 2011-12-26 DIAGNOSIS — Z862 Personal history of diseases of the blood and blood-forming organs and certain disorders involving the immune mechanism: Secondary | ICD-10-CM

## 2011-12-26 DIAGNOSIS — J449 Chronic obstructive pulmonary disease, unspecified: Secondary | ICD-10-CM | POA: Diagnosis present

## 2011-12-26 DIAGNOSIS — N289 Disorder of kidney and ureter, unspecified: Secondary | ICD-10-CM | POA: Insufficient documentation

## 2011-12-26 DIAGNOSIS — J4489 Other specified chronic obstructive pulmonary disease: Secondary | ICD-10-CM | POA: Insufficient documentation

## 2011-12-26 DIAGNOSIS — I5022 Chronic systolic (congestive) heart failure: Secondary | ICD-10-CM | POA: Diagnosis present

## 2011-12-26 DIAGNOSIS — Z79899 Other long term (current) drug therapy: Secondary | ICD-10-CM | POA: Insufficient documentation

## 2011-12-26 HISTORY — DX: Unspecified atrial fibrillation: I48.91

## 2011-12-26 HISTORY — DX: Unspecified systolic (congestive) heart failure: I50.20

## 2011-12-26 LAB — CBC WITH DIFFERENTIAL/PLATELET
Basophils Absolute: 0 10*3/uL (ref 0.0–0.1)
Basophils Relative: 1 % (ref 0–1)
Eosinophils Relative: 9 % — ABNORMAL HIGH (ref 0–5)
HCT: 36.3 % — ABNORMAL LOW (ref 39.0–52.0)
Hemoglobin: 11.7 g/dL — ABNORMAL LOW (ref 13.0–17.0)
Lymphocytes Relative: 28 % (ref 12–46)
MCHC: 32.2 g/dL (ref 30.0–36.0)
MCV: 94 fL (ref 78.0–100.0)
Monocytes Absolute: 0.6 10*3/uL (ref 0.1–1.0)
Monocytes Relative: 7 % (ref 3–12)
RDW: 15 % (ref 11.5–15.5)

## 2011-12-26 LAB — URINE MICROSCOPIC-ADD ON

## 2011-12-26 LAB — URINALYSIS, ROUTINE W REFLEX MICROSCOPIC
Nitrite: POSITIVE — AB
Protein, ur: NEGATIVE mg/dL
Specific Gravity, Urine: 1.015 (ref 1.005–1.030)
Urobilinogen, UA: 0.2 mg/dL (ref 0.0–1.0)

## 2011-12-26 LAB — PROTIME-INR: INR: 1.34 (ref 0.00–1.49)

## 2011-12-26 LAB — POCT I-STAT TROPONIN I: Troponin i, poc: 0.01 ng/mL (ref 0.00–0.08)

## 2011-12-26 LAB — BASIC METABOLIC PANEL
BUN: 22 mg/dL (ref 6–23)
CO2: 31 mEq/L (ref 19–32)
Calcium: 9.2 mg/dL (ref 8.4–10.5)
GFR calc non Af Amer: 69 mL/min — ABNORMAL LOW (ref >90)
Glucose, Bld: 110 mg/dL — ABNORMAL HIGH (ref 70–99)

## 2011-12-26 LAB — COMPREHENSIVE METABOLIC PANEL
AST: 17 U/L (ref 0–37)
BUN: 21 mg/dL (ref 6–23)
CO2: 30 mEq/L (ref 19–32)
Calcium: 9 mg/dL (ref 8.4–10.5)
Chloride: 106 mEq/L (ref 96–112)
Creatinine, Ser: 1.02 mg/dL (ref 0.50–1.35)
GFR calc Af Amer: 86 mL/min — ABNORMAL LOW (ref >90)
GFR calc non Af Amer: 75 mL/min — ABNORMAL LOW (ref >90)
Total Bilirubin: 0.3 mg/dL (ref 0.3–1.2)

## 2011-12-26 LAB — D-DIMER, QUANTITATIVE: D-Dimer, Quant: 0.49 ug/mL-FEU — ABNORMAL HIGH (ref 0.00–0.48)

## 2011-12-26 MED ORDER — ALPRAZOLAM 1 MG PO TABS
1.0000 mg | ORAL_TABLET | Freq: Every evening | ORAL | Status: DC | PRN
  Administered 2011-12-26: 1 mg via ORAL
  Filled 2011-12-26: qty 1

## 2011-12-26 MED ORDER — MORPHINE SULFATE 4 MG/ML IJ SOLN
4.0000 mg | Freq: Once | INTRAMUSCULAR | Status: AC
  Administered 2011-12-26: 4 mg via INTRAVENOUS
  Filled 2011-12-26: qty 1

## 2011-12-26 MED ORDER — ONDANSETRON HCL 4 MG/2ML IJ SOLN: 4.0000 mg | Freq: Four times a day (QID) | INTRAMUSCULAR | Status: DC | PRN

## 2011-12-26 MED ORDER — ALBUTEROL SULFATE HFA 108 (90 BASE) MCG/ACT IN AERS: 2.0000 | INHALATION_SPRAY | Freq: Four times a day (QID) | RESPIRATORY_TRACT | Status: DC | PRN

## 2011-12-26 MED ORDER — ZOLPIDEM TARTRATE 5 MG PO TABS
5.0000 mg | ORAL_TABLET | Freq: Every day | ORAL | Status: DC
  Administered 2011-12-26: 5 mg via ORAL
  Filled 2011-12-26: qty 1

## 2011-12-26 MED ORDER — ONDANSETRON HCL 4 MG/2ML IJ SOLN
4.0000 mg | Freq: Once | INTRAMUSCULAR | Status: AC
  Administered 2011-12-26: 4 mg via INTRAVENOUS
  Filled 2011-12-26: qty 2

## 2011-12-26 MED ORDER — FLUTICASONE-SALMETEROL 250-50 MCG/DOSE IN AEPB
1.0000 | INHALATION_SPRAY | Freq: Two times a day (BID) | RESPIRATORY_TRACT | Status: DC
  Filled 2011-12-26: qty 14

## 2011-12-26 MED ORDER — OXYCODONE HCL 5 MG PO TABS
5.0000 mg | ORAL_TABLET | ORAL | Status: DC | PRN
  Administered 2011-12-26: 5 mg via ORAL
  Filled 2011-12-26: qty 1

## 2011-12-26 MED ORDER — VENLAFAXINE HCL 37.5 MG PO TABS: 75.0000 mg | ORAL_TABLET | Freq: Two times a day (BID) | ORAL | Status: DC

## 2011-12-26 MED ORDER — DEXTROSE 5 % IV SOLN
INTRAVENOUS | Status: AC
  Filled 2011-12-26: qty 10

## 2011-12-26 MED ORDER — DEXTROSE 5 % IV SOLN
1.0000 g | INTRAVENOUS | Status: DC
  Administered 2011-12-26: 1 g via INTRAVENOUS
  Filled 2011-12-26 (×3): qty 10

## 2011-12-26 MED ORDER — ALBUTEROL SULFATE HFA 108 (90 BASE) MCG/ACT IN AERS: 2.0000 | INHALATION_SPRAY | Freq: Four times a day (QID) | RESPIRATORY_TRACT | Status: DC

## 2011-12-26 MED ORDER — DOCUSATE SODIUM 100 MG PO CAPS: 100.0000 mg | ORAL_CAPSULE | Freq: Every day | ORAL | Status: DC

## 2011-12-26 MED ORDER — ONDANSETRON HCL 4 MG PO TABS: 4.0000 mg | ORAL_TABLET | Freq: Four times a day (QID) | ORAL | Status: DC | PRN

## 2011-12-26 MED ORDER — ZOLPIDEM TARTRATE 5 MG PO TABS: 10.0000 mg | ORAL_TABLET | Freq: Every day | ORAL | Status: DC

## 2011-12-26 MED ORDER — OXYCODONE HCL 20 MG PO TB12
40.0000 mg | ORAL_TABLET | Freq: Two times a day (BID) | ORAL | Status: DC
  Administered 2011-12-26: 40 mg via ORAL
  Filled 2011-12-26: qty 2

## 2011-12-26 MED ORDER — DIGOXIN 250 MCG PO TABS: 0.2500 mg | ORAL_TABLET | Freq: Every day | ORAL | Status: DC

## 2011-12-26 MED ORDER — DILTIAZEM HCL ER COATED BEADS 180 MG PO CP24: 360.0000 mg | ORAL_CAPSULE | Freq: Every day | ORAL | Status: DC

## 2011-12-26 MED ORDER — SODIUM CHLORIDE 0.9 % IV SOLN: INTRAVENOUS | Status: DC

## 2011-12-26 MED ORDER — CITALOPRAM HYDROBROMIDE 20 MG PO TABS: 40.0000 mg | ORAL_TABLET | Freq: Every day | ORAL | Status: DC

## 2011-12-26 MED ORDER — FUROSEMIDE 40 MG PO TABS: 40.0000 mg | ORAL_TABLET | Freq: Two times a day (BID) | ORAL | Status: DC

## 2011-12-26 MED ORDER — METOPROLOL SUCCINATE ER 25 MG PO TB24: 25.0000 mg | ORAL_TABLET | Freq: Every day | ORAL | Status: DC

## 2011-12-26 MED ORDER — POTASSIUM CHLORIDE CRYS ER 20 MEQ PO TBCR: 40.0000 meq | EXTENDED_RELEASE_TABLET | Freq: Two times a day (BID) | ORAL | Status: DC

## 2011-12-26 MED ORDER — SODIUM CHLORIDE 0.9 % IJ SOLN: 3.0000 mL | Freq: Two times a day (BID) | INTRAMUSCULAR | Status: DC

## 2011-12-26 MED ORDER — HYDROMORPHONE HCL PF 1 MG/ML IJ SOLN: 1.0000 mg | INTRAMUSCULAR | Status: DC | PRN

## 2011-12-26 MED ORDER — LISINOPRIL 10 MG PO TABS: 40.0000 mg | ORAL_TABLET | Freq: Every day | ORAL | Status: DC

## 2011-12-26 NOTE — ED Notes (Signed)
Patient has been able to void moderate amount in urinal.

## 2011-12-26 NOTE — Progress Notes (Signed)
Pt c/o severe pain 10/10 in lower abdomen and pelvis along with inability to urinate. Pt scheduled for surgery on Tuesday. Pt sent to ED for immediate attention.

## 2011-12-26 NOTE — ED Notes (Signed)
Report called to Marybeth, RN on unit 300. 

## 2011-12-26 NOTE — Consult Note (Signed)
Report#[id number]

## 2011-12-26 NOTE — ED Notes (Signed)
Urinary retention, difficulty for 3-4 day.  Seen by Dr Jerre Simon  And is to have a procedure on Tuesday.

## 2011-12-26 NOTE — ED Provider Notes (Signed)
History    This chart was scribed for Mitchell Human, MD, MD by Smitty Pluck. The patient was seen in room APA10 and the patient's care was started at 1:55PM.   CSN: [id number]  Arrival date & time 12/26/11  1336   First MD Initiated Contact with Patient 12/26/11 1347      Chief Complaint  Patient presents with  . Urinary Retention    (Consider location/radiation/quality/duration/timing/severity/associated sxs/prior treatment) The history is provided by the patient.   KAELON WEEKES is a 66 y.o. male who presents to the Emergency Department complaining of moderate urinary retention onset 3 days ago. Pt reports that he went to see Dr. Jerre Simon 1 day ago and Dr. Jerre Simon tried to open his urethra but was unable to do so. Pt had transurethral resection of bladder tumor 08-25-11. He is suppose to have procedure on Tuesday. Pt has hx of prostate and colon removal.   Past Medical History  Diagnosis Date  . COPD (chronic obstructive pulmonary disease)   . Hypertension   . Pneumonia   . Cancer   . Prostate cancer   . Colon cancer   . Leukemia   . Leukemia   . Kidney stones     Past Surgical History  Procedure Date  . Colon surgery   . Prostate surgery   . Transurethral resection of bladder tumor 08/25/2011    Procedure: TRANSURETHRAL RESECTION OF BLADDER TUMOR (TURBT);  Surgeon: Ky Barban, MD;  Location: [facility];  Service: Urology;  Laterality: N/A;  Transurethral Resection of Bladder Neck    Family History  Problem Relation Age of Onset  . Heart attack Mother   . Cancer Father   . Stroke Father     History  Substance Use Topics  . Smoking status: Current Everyday Smoker -- 0.5 packs/day for 42 years    Types: Cigarettes  . Smokeless tobacco: Not on file  . Alcohol Use: No      Review of Systems  Genitourinary: Positive for difficulty urinating.  All other systems reviewed and are negative.    Allergies  Review of patient's allergies indicates no known  allergies.  Home Medications   Current Outpatient Rx  Name Route Sig Dispense Refill  . ALBUTEROL SULFATE HFA 108 (90 BASE) MCG/ACT IN AERS Inhalation Inhale 2 puffs into the lungs every 6 (six) hours as needed. For shortness of breath     . ALPRAZOLAM 1 MG PO TABS Oral Take 1 mg by mouth at bedtime as needed. For sleep     . CITALOPRAM HYDROBROMIDE 40 MG PO TABS Oral Take 40 mg by mouth daily.      Marland Kitchen DIGOXIN 0.25 MG PO TABS Oral Take 1 tablet (0.25 mg total) by mouth daily. 30 tablet 0  . DILTIAZEM HCL ER COATED BEADS 360 MG PO CP24 Oral Take 1 capsule (360 mg total) by mouth daily. 30 capsule 0  . DOCUSATE SODIUM 100 MG PO CAPS Oral Take 100 mg by mouth as needed. For constipation     . FLUTICASONE-SALMETEROL 250-50 MCG/DOSE IN AEPB Inhalation Inhale 1 puff into the lungs every 12 (twelve) hours.      . FUROSEMIDE 40 MG PO TABS Oral Take 1 tablet (40 mg total) by mouth 2 (two) times daily. 60 tablet 0  . IMATINIB MESYLATE 400 MG PO TABS Oral Take 400 mg by mouth every evening. Take with meals and large glass of water.Caution:Chemotherapy.     . LUPRON IJ Injection Inject 1 each  as directed every 4 (four) months.      Marland Kitchen LISINOPRIL 40 MG PO TABS Oral Take 40 mg by mouth daily.    Marland Kitchen METOPROLOL SUCCINATE ER 25 MG PO TB24 Oral Take 25 mg by mouth daily.    . MOMETASONE FUROATE 0.1 % EX CREA Topical Apply 1 application topically daily as needed. Skin lesions     . OXYCODONE HCL 5 MG PO CAPS Oral Take 5 mg by mouth every 4 (four) hours as needed. For breakthrough pain    . OXYCODONE HCL ER 40 MG PO TB12 Oral Take 40 mg by mouth every 12 (twelve) hours.    Marland Kitchen POTASSIUM CHLORIDE 10 MEQ PO TBCR Oral Take 4 tablets (40 mEq total) by mouth 2 (two) times daily. 240 tablet 0  . RIVAROXABAN 20 MG PO TABS Oral Take 1 tablet by mouth daily.    . VENLAFAXINE HCL 75 MG PO TABS Oral Take 75 mg by mouth 2 (two) times daily.      Marland Kitchen ZOLPIDEM TARTRATE 10 MG PO TABS Oral Take 10 mg by mouth at bedtime.      BP  130/70  Pulse 124  Temp 98.4 F (36.9 C) (Oral)  Resp 20  Ht 6\' 1"  (1.854 m)  Wt 203 lb (92.08 kg)  BMI 26.78 kg/m2  SpO2 97%  Physical Exam  Nursing note and vitals reviewed. Constitutional: He is oriented to person, place, and time. He appears well-developed and well-nourished.  HENT:  Head: Normocephalic and atraumatic.  Eyes: Conjunctivae are normal.  Neck: Normal range of motion. Neck supple.  Pulmonary/Chest: Effort normal. No respiratory distress.  Musculoskeletal: Normal range of motion. He exhibits no tenderness.  Neurological: He is alert and oriented to person, place, and time.  Skin: Skin is warm and dry.  Psychiatric: He has a normal mood and affect. His behavior is normal.    ED Course  Procedures (including critical care time) DIAGNOSTIC STUDIES: Oxygen Saturation is 97% on room air, normal by my interpretation.    COORDINATION OF CARE:  2:07 PM Attempted to insert #16 coude-tipped catheter, without success; catheter stopped at level of prostate.   Therefore, call to Dr. Jerre Simon, his urologist.  2:15PM Ordered:  Medications  0.9 %  sodium chloride infusion (not administered)  morphine 4 MG/ML injection 4 mg (not administered)  ondansetron (ZOFRAN) injection 4 mg (not administered)    2:21 PM Dr. Jerre Simon called back, and is on his way in to see pt.  2:36 PM  Date: 12/26/2011  Rate: 86  Rhythm: atrial fibrillation  QRS Axis: left  Intervals: normal QRS:  Poor R wave progression in precordial leads suggests old anterior myocardial infarction.  ST/T Wave abnormalities: normal  Conduction Disutrbances:none  Narrative Interpretation: Abnormal EKG  Old EKG Reviewed: changes noted--PVC's have ceased since tracing of 08/22/2011.  4:10 PM Case discussed with Dr. Jerre Simon.  He asks that hospitalists admit pt and he will be in to see pt.    Results for orders placed during the hospital encounter of 12/26/11  CBC WITH DIFFERENTIAL      Component Value Range     WBC 7.9  4.0 - 10.5 K/uL   RBC 3.86 (*) 4.22 - 5.81 MIL/uL   Hemoglobin 11.7 (*) 13.0 - 17.0 g/dL   HCT 96.2 (*) 95.2 - 84.1 %   MCV 94.0  78.0 - 100.0 fL   MCH 30.3  26.0 - 34.0 pg   MCHC 32.2  30.0 - 36.0 g/dL  RDW 15.0  11.5 - 15.5 %   Platelets 294  150 - 400 K/uL   Neutrophils Relative 56  43 - 77 %   Neutro Abs 4.4  1.7 - 7.7 K/uL   Lymphocytes Relative 28  12 - 46 %   Lymphs Abs 2.2  0.7 - 4.0 K/uL   Monocytes Relative 7  3 - 12 %   Monocytes Absolute 0.6  0.1 - 1.0 K/uL   Eosinophils Relative 9 (*) 0 - 5 %   Eosinophils Absolute 0.7  0.0 - 0.7 K/uL   Basophils Relative 1  0 - 1 %   Basophils Absolute 0.0  0.0 - 0.1 K/uL  COMPREHENSIVE METABOLIC PANEL      Component Value Range   Sodium 140  135 - 145 mEq/L   Potassium 3.9  3.5 - 5.1 mEq/L   Chloride 106  96 - 112 mEq/L   CO2 30  19 - 32 mEq/L   Glucose, Bld 118 (*) 70 - 99 mg/dL   BUN 21  6 - 23 mg/dL   Creatinine, Ser 0.98  0.50 - 1.35 mg/dL   Calcium 9.0  8.4 - 11.9 mg/dL   Total Protein 6.3  6.0 - 8.3 g/dL   Albumin 3.4 (*) 3.5 - 5.2 g/dL   AST 17  0 - 37 U/L   ALT 19  0 - 53 U/L   Alkaline Phosphatase 82  39 - 117 U/L   Total Bilirubin 0.3  0.3 - 1.2 mg/dL   GFR calc non Af Amer 75 (*) >90 mL/min   GFR calc Af Amer 86 (*) >90 mL/min  D-DIMER, QUANTITATIVE      Component Value Range   D-Dimer, Quant 0.49 (*) 0.00 - 0.48 ug/mL-FEU  APTT      Component Value Range   aPTT 39 (*) 24 - 37 seconds  PROTIME-INR      Component Value Range   Prothrombin Time 16.8 (*) 11.6 - 15.2 seconds   INR 1.34  0.00 - 1.49  POCT I-STAT TROPONIN I      Component Value Range   Troponin i, poc 0.01  0.00 - 0.08 ng/mL   Comment 3            Dg Chest Port 1 View  12/26/2011  *RADIOLOGY REPORT*  Clinical Data: Acute urinary retention, history CHF, COPD, hypertension, leukemia, prostate cancer, colon cancer  PORTABLE CHEST - 1 VIEW  Comparison: Portable exam 1419 hours compared to 08/23/2011  Findings: Enlargement of cardiac  silhouette. Mediastinal contours and pulmonary vascularity normal for lordotic technique. Bibasilar atelectasis greater on left. Remaining lungs clear. Mild chronic peribronchial thickening. No pleural effusion or pneumothorax. Bones demineralized.  IMPRESSION: Enlargement of cardiac silhouette. Bibasilar atelectasis greater on left. Minimal chronic bronchitic changes.   Original Report Authenticated By: Lollie Marrow, M.D.    4:19 PM Case discussed with Dr. Crista Curb.  Admit to [facility] 1 to a telemetry bed.   1. Acute urinary retention     I personally performed the services described in this documentation, which was scribed in my presence. The recorded information has been reviewed and considered.  Mitchell Human, MD        Carleene Cooper III, MD 12/26/11 [phone]

## 2011-12-26 NOTE — Progress Notes (Signed)
Nurse walked into patient's room and patient was putting a pill bottle into nightstand drawer.  Nurse asked patient about medication.  Patient stated it was pain medication.  Medication bottle was from [facility] and was labeled Oxycontin 40 mg tablets; however, the bottle contained three different unidentifiable medications.  The medications were counted and the pharmacy paperwork completed.  AC and Press photographer were notified.  Fara Chute, RN 12/26/2011

## 2011-12-26 NOTE — Patient Instructions (Addendum)
20 Mitchell [name]  12/26/2011   Your procedure is scheduled on:  12/30/2011  Report to [facility] at  0910  AM.  Call this number if you have problems the morning of surgery: [phone]   Remember:   Do not eat food:After Midnight.  May have clear liquids:until Midnight .    Take these medicines the morning of surgery with A SIP OF WATER:  Cardiazem,xanax,celexa,lisinopril,toprol,oxycontin,effexor   Do not wear jewelry, make-up or nail polish.  Do not wear lotions, powders, or perfumes. You may wear deodorant.  Do not shave 48 hours prior to surgery. Men may shave face and neck.  Do not bring valuables to the hospital.  Contacts, dentures or bridgework may not be worn into surgery.  Leave suitcase in the car. After surgery it may be brought to your room.  For patients admitted to the hospital, checkout time is 11:00 AM the day of discharge.   Patients discharged the day of surgery will not be allowed to drive home.  Name and phone number of your driver: family  Special Instructions: CHG Shower Use Special Wash: 1/2 bottle night before surgery and 1/2 bottle morning of surgery.   Please read over the following fact sheets that you were given: Pain Booklet, MRSA Information, Surgical Site Infection Prevention, Anesthesia Post-op Instructions and Care and Recovery After Surgery Cystoscopy (Bladder Exam) Care After Refer to this sheet in the next few weeks. These discharge instructions provide you with general information on caring for yourself after you leave the hospital. Your caregiver may also give you specific instructions. Your treatment has been planned according to the most current medical practices available, but unavoidable complications sometimes occur. If you have any problems or questions after discharge, please call your caregiver. AFTER THE PROCEDURE   There may be temporary bleeding and burning with urination.   Drink enough water and fluids to keep your urine clear  or pale yellow.  FINDING OUT THE RESULTS OF YOUR TEST Not all test results are available during your visit. If your test results are not back during the visit, make an appointment with your caregiver to find out the results. Do not assume everything is normal if you have not heard from your caregiver or the medical facility. It is important for you to follow up on all of your test results. SEEK IMMEDIATE MEDICAL CARE IF:   There is an increase in blood in the urine or you are passing clots.   There is difficulty passing urine.   You develop the chills.   You have an oral temperature above 102 F (38.9 C), not controlled by medicine.   Belly (abdominal) pain develops.  Document Released: 11/01/2004 Document Revised: 04/03/2011 Document Reviewed: 08/30/2007 [facility] Patient Information 2012 [place], [place].PATIENT INSTRUCTIONS POST-ANESTHESIA  IMMEDIATELY FOLLOWING SURGERY:  Do not drive or operate machinery for the first twenty four hours after surgery.  Do not make any important decisions for twenty four hours after surgery or while taking narcotic pain medications or sedatives.  If you develop intractable nausea and vomiting or a severe headache please notify your doctor immediately.  FOLLOW-UP:  Please make an appointment with your surgeon as instructed. You do not need to follow up with anesthesia unless specifically instructed to do so.  WOUND CARE INSTRUCTIONS (if applicable):  Keep a dry clean dressing on the anesthesia/puncture wound site if there is drainage.  Once the wound has quit draining you may leave it open to air.  Generally  you should leave the bandage intact for twenty four hours unless there is drainage.  If the epidural site drains for more than 36-48 hours please call the anesthesia department.  QUESTIONS?:  Please feel free to call your physician or the hospital operator if you have any questions, and they will be happy to assist you.

## 2011-12-26 NOTE — H&P (Signed)
Hospital Admission Note Date: 12/26/2011  Patient name: Mitchell [name] Medical record number: [id number] Date of birth: 1945-11-08 Age: 66 y.o. Gender: male PCP: Toma Deiters, MD  Chief Complaint: Unable to urinate  History of Present Illness:  Mitchell [name] is an 66 y.o. male with history of prostate cancer prostatectomy and previous urinary retention who presents with worsening difficulty voiding. His symptoms have waxed and waned but worsened over the past few days. He was seen in the office by Dr. Jesse Fall yesterday who tried to pass a Foley catheter. Apparently this was unsuccessful and he was scheduled for a procedure this coming Tuesday. His symptoms worsened however and he came to the emergency room. He is able to "dribble a few drops". He is feeling a lot of suprapubic pressure. He's had no fevers chills vomiting. Dr. Ignacia Palma, ED physician also attempted to pass a bladder catheter unsuccessfully. He discussed the case with Dr. Jesse Fall who recommended admission to the hospitalist service and he will consult. He has not yet evaluated the patient but apparently is considering taking the patient to the OR later tonight according to Dr. Ignacia Palma.  Past Medical History  Diagnosis Date  . COPD (chronic obstructive pulmonary disease)   . Hypertension   . Pneumonia   . Cancer   . Prostate cancer   . Systolic heart failure   . Leukemia   . Leukemia   . Kidney stones    chronic atrial fibrillation  Meds: See med rec  Allergies: Review of patient's allergies indicates no known allergies. History   Social History  . Marital Status: Married    Spouse Name: N/A    Number of Children: N/A  . Years of Education: N/A   Occupational History  . Not on file.   Social History Main Topics  . Smoking status: Current Everyday Smoker -- 0.5 packs/day for 42 years    Types: Cigarettes  . Smokeless tobacco: Not on file  . Alcohol Use: No  . Drug Use: No  . Sexually Active:    Other  Topics Concern  . Not on file   Social History Narrative   Lives in [place] with his wife.  Smoker.    Family History  Problem Relation Age of Onset  . Heart attack Mother   . Cancer Father   . Stroke Father    Past Surgical History  Procedure Date  . Colon surgery   . Prostate surgery   . Transurethral resection of bladder tumor 08/25/2011    Procedure: TRANSURETHRAL RESECTION OF BLADDER TUMOR (TURBT);  Surgeon: Ky Barban, MD;  Location: [facility];  Service: Urology;  Laterality: N/A;  Transurethral Resection of Bladder Neck    Review of Systems: Systems reviewed and as per HPI, otherwise negative.  Physical Exam: Blood pressure 130/86, pulse 87, temperature 98.4 F (36.9 C), temperature source Oral, resp. rate 20, height 6\' 1"  (1.854 m), weight 92.08 kg (203 lb), SpO2 94.00%. BP 130/86  Pulse 87  Temp 98.4 F (36.9 C) (Oral)  Resp 20  Ht 6\' 1"  (1.854 m)  Wt 92.08 kg (203 lb)  BMI 26.78 kg/m2  SpO2 94%  General Appearance:    Alert, cooperative, no distress, appears stated age  Head:    Normocephalic, without obvious abnormality, atraumatic  Eyes:    PERRL, conjunctiva/corneas clear, EOM's intact, fundi    benign, both eyes          Nose:   Nares normal, septum midline, mucosa normal, no drainage  or sinus tenderness  Throat:   Lips, mucosa, and tongue normal; teeth and gums normal  Neck:   Supple, symmetrical, trachea midline, no adenopathy;       thyroid:  No enlargement/tenderness/nodules; no carotid   bruit or JVD  Back:     Symmetric, no curvature, ROM normal, no CVA tenderness  Lungs:     diminished throughout without wheezes rhonchi or rales   Chest wall:    No tenderness or deformity  Heart:    irregularly irregular no murmurs gallops rubs   Abdomen:     obese, mild suprapubic tenderness. Normal bowel sounds   Genitalia:   deferred   Rectal:   deferred   Extremities:   Extremities normal, atraumatic, no cyanosis or edema  Pulses:   2+ and symmetric  all extremities  Skin:   Skin color, texture, turgor normal, no rashes or lesions  Lymph nodes:   Cervical, supraclavicular, and axillary nodes normal  Neurologic:   CNII-XII intact. Normal strength, sensation and reflexes      throughout    Psychiatric: Normal affect  Lab results: Basic Metabolic Panel:  Basename 12/26/11 1515 12/26/11 1315  NA 140 140  K 3.9 4.1  CL 106 104  CO2 30 31  GLUCOSE 118* 110*  BUN 21 22  CREATININE 1.02 1.09  CALCIUM 9.0 9.2  MG -- --  PHOS -- --   Liver Function Tests:  Basename 12/26/11 1515  AST 17  ALT 19  ALKPHOS 82  BILITOT 0.3  PROT 6.3  ALBUMIN 3.4*   No results found for this basename: LIPASE:2,AMYLASE:2 in the last 72 hours No results found for this basename: AMMONIA:2 in the last 72 hours CBC:  Basename 12/26/11 1411 12/26/11 1315  WBC 7.9 --  NEUTROABS 4.4 --  HGB 11.7* 12.1*  HCT 36.3* 37.5*  MCV 94.0 --  PLT 294 --   Cardiac Enzymes: No results found for this basename: CKTOTAL:3,CKMB:3,CKMBINDEX:3,TROPONINI:3 in the last 72 hours BNP: No results found for this basename: PROBNP:3 in the last 72 hours D-Dimer:  [facility] 12/26/11 1515  DDIMER 0.49*   CBG: No results found for this basename: GLUCAP:6 in the last 72 hours Hemoglobin A1C: No results found for this basename: HGBA1C in the last 72 hours Fasting Lipid Panel: No results found for this basename: CHOL,HDL,LDLCALC,TRIG,CHOLHDL,LDLDIRECT in the last 72 hours Thyroid Function Tests: No results found for this basename: TSH,T4TOTAL,FREET4,T3FREE,THYROIDAB in the last 72 hours Anemia Panel: No results found for this basename: VITAMINB12,FOLATE,FERRITIN,TIBC,IRON,RETICCTPCT in the last 72 hours Coagulation:  Basename 12/26/11 1515  LABPROT 16.8*  INR 1.34   Urine Drug Screen: Drugs of Abuse  No results found for this basename: labopia, cocainscrnur, labbenz, amphetmu, thcu, labbarb    Alcohol Level: No results found for this basename: ETH:2 in the last  72 hours Urinalysis:  Basename 12/26/11 1543  COLORURINE YELLOW  LABSPEC 1.015  PHURINE 6.0  GLUCOSEU NEGATIVE  HGBUR MODERATE*  BILIRUBINUR NEGATIVE  KETONESUR NEGATIVE  PROTEINUR NEGATIVE  UROBILINOGEN 0.2  NITRITE POSITIVE*  LEUKOCYTESUR SMALL*   Imaging results:  Dg Chest Port 1 View  12/26/2011  *RADIOLOGY REPORT*  Clinical Data: Acute urinary retention, history CHF, COPD, hypertension, leukemia, prostate cancer, colon cancer  PORTABLE CHEST - 1 VIEW  Comparison: Portable exam 1419 hours compared to 08/23/2011  Findings: Enlargement of cardiac silhouette. Mediastinal contours and pulmonary vascularity normal for lordotic technique. Bibasilar atelectasis greater on left. Remaining lungs clear. Mild chronic peribronchial thickening. No pleural effusion or pneumothorax. Bones demineralized.  IMPRESSION: Enlargement of cardiac silhouette. Bibasilar atelectasis greater on left. Minimal chronic bronchitic changes.   Original Report Authenticated By: Lollie Marrow, M.D.     Assessment & Plan: Principal Problem:  *Acute urinary retention Active Problems:  HYPERTENSION  PROSTATE CANCER, HX OF  UTI (urinary tract infection)  Chronic systolic heart failure, compensated  CLL  AF (atrial fibrillation)  COPD (chronic obstructive pulmonary disease), stable  Patient will be placed on telemetry on observation. Hold xarelto in case he needs a procedure. Await Dr. Sylvie Farrier recommendations. Stop IV fluids. Hold Lasix for now. Give IV ceftriaxone. Sequential compression devices. Medical problems are stable at this time.  Vonte Rossin L 12/26/2011, 5:06 PM

## 2011-12-27 DIAGNOSIS — Z8546 Personal history of malignant neoplasm of prostate: Secondary | ICD-10-CM

## 2011-12-27 MED ORDER — CIPROFLOXACIN HCL 500 MG PO TABS: 500.0000 mg | ORAL_TABLET | Freq: Two times a day (BID) | ORAL | Status: AC

## 2011-12-27 NOTE — Progress Notes (Signed)
Patient left via wheelchair with nt and wife.  Patient was calm and stable at time of discharge.

## 2011-12-27 NOTE — Progress Notes (Signed)
Call received by RN that patient wanted to go home and that Dr. Frann Rider told him he could be discharged this evening if he was voiding. RN reports patient has voided several times and that wife is here to pick him up. He will follow up with Dr. Frann Rider as outpatient. All prior home meds resumed and discharge order written.

## 2011-12-27 NOTE — Progress Notes (Signed)
Patient had been in pain from urgency to void without actual results.  Dr. Jerre Simon is aware of his situation and plan for him to keep his procedure scheduled for Tuesday.  Patient agreed to stay overnight when Dr. Jerre Simon was on the floor but later changed his mind.  His wife tried was called and informed of the above situation and tired to get him to stay overnight.  Patient has refused.  He has pulled out his IV and taken his telemetry off.  This patient was given pain, sleep and anxiety medication over the past few hours Hospitalist and wife made aware.  Patient is being discharged home.

## 2011-12-27 NOTE — Consult Note (Signed)
NAME:  Mitchell [name], Mitchell [name] NO.:  [id number]  MEDICAL RECORD NO.:  [id number]  LOCATION:                                FACILITY:  [facility]  PHYSICIAN:  Ky Barban, M.D.DATE OF BIRTH:  06-02-1945  DATE OF CONSULTATION:  12/26/2011 DATE OF DISCHARGE:  12/27/2011                                CONSULTATION   CHIEF COMPLAINT:  Difficulty to void.  HISTORY:  This 66 year old gentleman who is well known to me had radical retropubic prostatectomy for prostate cancer in 1999 in [facility]. However, since then, he is having difficulty voiding.  He has developed bladder neck stenosis.  I have dilated him and resected his bladder neck in the past.  Yesterday he was in the office complaining of difficulty to void.  I tried to dilate him with filiforms and followers, but only the smallest catheter I could get I could not dilate him fully, so I told them that he was able to void just dribble, and I have scheduled him to do cystoscopy under anesthesia with dilation and possible bladder neck resection under anesthesia.  It is very uncomfortable to put any catheter in this patient.  He is not running any fever or any other urological problem, but he has multiple other problems.  Now he came today because he was developing more difficulty to void.  He is just dribbling urine, has suprapubic pressure.  No fever or chills.  Came to the emergency room.  He is admitted by the Hospitalist Service.  I am seeing him as a consult.  I just talked to him and he says that he is able to just dribble and I told him he is on schedule for Tuesday.  He said that he can wait until Tuesday, but I told him that if the problem is that he is unable to void at all, then I can go ahead and do it before Tuesday, but he says it is fine that he can wait, and he has some difficulty, but as long as he is able to void and unless he goes completely in acute retention.  I did not want to take him to  the operating room and give him anesthesia.  I will watch him tonight how he does.  OTHER PAST MEDICAL HISTORY: 1. COPD. 2. Hypertension. 3. Pneumonia. 4. Prostate cancer. 5. History of kidney stones. 6. Chronic atrial fibrillation.  FAMILY HISTORY:  His father had prostate cancer.  PERSONAL HISTORY:  He is married.  He smokes half pack of cigarettes every day.  Alcohol use none.  Drug use none.  REVIEW OF SYSTEMS:  Otherwise, unremarkable.  PAST SURGICAL HISTORY:  Colon surgery, prostate surgery for cancer.  I think he had a transurethral resection of his bladder neck.  I do not remember that he ever had any tumor in the bladder.  PHYSICAL EXAMINATION:  GENERAL:  Moderately built male not in any acute distress, fully conscious, alert, oriented.  VITAL SIGNS:  Blood pressure 130/86, temperature is 98.4. ABDOMEN:  Soft, flat.  Liver, spleen, kidneys not palpable. BACK:  No CVA tenderness. GU:  Bladder appears slightly distended.  External genitalia is unremarkable.  Uncircumcised  meatus adequate.  Testicles are normal. RECTAL:  Deferred.  LABORATORY DATA:  Sodium 140, potassium 4.1, chloride 104, CO2 is 31, glucose 110, BUN is 22, creatinine 1.09.  WBC count is 7.9, hematocrit is 37.5.  IMPRESSION:  Prostate cancer, bladder neck stenosis, post radical retropubic prostatectomy.  PLAN:  Cysto bladder neck dilation resection under anesthesia on Tuesday, but I will check him again in the morning.  If he is having more problem or if he cannot pee, then I will go ahead and do this procedure as soon as possible.  His INR is 1.34.  He has told me he is taking some kind of anticoagulation, but we do not know the name of the medicine.     Ky Barban, M.D.     MIJ/MEDQ  D:  12/26/2011  T:  12/27/2011  Job:  [id number]

## 2011-12-29 LAB — URINE CULTURE

## 2011-12-30 ENCOUNTER — Encounter (HOSPITAL_COMMUNITY): Payer: Self-pay | Admitting: Anesthesiology

## 2011-12-30 ENCOUNTER — Ambulatory Visit (HOSPITAL_COMMUNITY): Payer: Medicare Other | Admitting: Anesthesiology

## 2011-12-30 ENCOUNTER — Encounter (HOSPITAL_COMMUNITY)
Admission: RE | Disposition: A | Discharge status: Home / Self Care | Payer: Self-pay | Source: Ambulatory Visit | Attending: Urology

## 2011-12-30 ENCOUNTER — Ambulatory Visit (HOSPITAL_COMMUNITY)
Admission: RE | Admit: 2011-12-30 | Discharge: 2011-12-30 | Disposition: A | Discharge status: Home / Self Care | Payer: Medicare Other | Source: Ambulatory Visit | Attending: Urology | Admitting: Urology

## 2011-12-30 ENCOUNTER — Encounter (HOSPITAL_COMMUNITY): Payer: Self-pay | Admitting: *Deleted

## 2011-12-30 DIAGNOSIS — J4489 Other specified chronic obstructive pulmonary disease: Secondary | ICD-10-CM | POA: Insufficient documentation

## 2011-12-30 DIAGNOSIS — Z8546 Personal history of malignant neoplasm of prostate: Secondary | ICD-10-CM

## 2011-12-30 DIAGNOSIS — Z8042 Family history of malignant neoplasm of prostate: Secondary | ICD-10-CM | POA: Insufficient documentation

## 2011-12-30 DIAGNOSIS — F411 Generalized anxiety disorder: Secondary | ICD-10-CM

## 2011-12-30 DIAGNOSIS — Z01812 Encounter for preprocedural laboratory examination: Secondary | ICD-10-CM | POA: Insufficient documentation

## 2011-12-30 DIAGNOSIS — C9110 Chronic lymphocytic leukemia of B-cell type not having achieved remission: Secondary | ICD-10-CM

## 2011-12-30 DIAGNOSIS — E669 Obesity, unspecified: Secondary | ICD-10-CM

## 2011-12-30 DIAGNOSIS — R338 Other retention of urine: Secondary | ICD-10-CM

## 2011-12-30 DIAGNOSIS — F329 Major depressive disorder, single episode, unspecified: Secondary | ICD-10-CM

## 2011-12-30 DIAGNOSIS — Z9889 Other specified postprocedural states: Secondary | ICD-10-CM | POA: Insufficient documentation

## 2011-12-30 DIAGNOSIS — K5900 Constipation, unspecified: Secondary | ICD-10-CM

## 2011-12-30 DIAGNOSIS — K589 Irritable bowel syndrome without diarrhea: Secondary | ICD-10-CM

## 2011-12-30 DIAGNOSIS — I4891 Unspecified atrial fibrillation: Secondary | ICD-10-CM

## 2011-12-30 DIAGNOSIS — Z8639 Personal history of other endocrine, nutritional and metabolic disease: Secondary | ICD-10-CM

## 2011-12-30 DIAGNOSIS — I10 Essential (primary) hypertension: Secondary | ICD-10-CM

## 2011-12-30 DIAGNOSIS — Z9981 Dependence on supplemental oxygen: Secondary | ICD-10-CM | POA: Insufficient documentation

## 2011-12-30 DIAGNOSIS — I509 Heart failure, unspecified: Secondary | ICD-10-CM

## 2011-12-30 DIAGNOSIS — N320 Bladder-neck obstruction: Secondary | ICD-10-CM | POA: Insufficient documentation

## 2011-12-30 DIAGNOSIS — J449 Chronic obstructive pulmonary disease, unspecified: Secondary | ICD-10-CM | POA: Insufficient documentation

## 2011-12-30 DIAGNOSIS — I5022 Chronic systolic (congestive) heart failure: Secondary | ICD-10-CM

## 2011-12-30 DIAGNOSIS — J40 Bronchitis, not specified as acute or chronic: Secondary | ICD-10-CM

## 2011-12-30 DIAGNOSIS — N390 Urinary tract infection, site not specified: Secondary | ICD-10-CM

## 2011-12-30 DIAGNOSIS — I252 Old myocardial infarction: Secondary | ICD-10-CM

## 2011-12-30 DIAGNOSIS — R32 Unspecified urinary incontinence: Secondary | ICD-10-CM

## 2011-12-30 DIAGNOSIS — F3289 Other specified depressive episodes: Secondary | ICD-10-CM

## 2011-12-30 HISTORY — PX: FOREIGN BODY REMOVAL: SHX962

## 2011-12-30 HISTORY — PX: URETHROTOMY: SHX1083

## 2011-12-30 HISTORY — PX: CYSTOSCOPY WITH URETHRAL DILATATION: SHX5125

## 2011-12-30 SURGERY — RESECTION, BLADDER NECK, TRANSURETHRAL
Anesthesia: Spinal | Wound class: Clean Contaminated

## 2011-12-30 MED ORDER — PROPOFOL 10 MG/ML IV EMUL
INTRAVENOUS | Status: AC
  Filled 2011-12-30: qty 20

## 2011-12-30 MED ORDER — CEFAZOLIN SODIUM-DEXTROSE 2-3 GM-% IV SOLR
INTRAVENOUS | Status: AC
  Filled 2011-12-30: qty 50

## 2011-12-30 MED ORDER — MIDAZOLAM HCL 2 MG/2ML IJ SOLN
INTRAMUSCULAR | Status: AC
  Filled 2011-12-30: qty 2

## 2011-12-30 MED ORDER — MIDAZOLAM HCL 2 MG/2ML IJ SOLN
1.0000 mg | INTRAMUSCULAR | Status: DC | PRN
  Administered 2011-12-30 (×2): 2 mg via INTRAVENOUS

## 2011-12-30 MED ORDER — GENTAMICIN SULFATE 40 MG/ML IJ SOLN
INTRAMUSCULAR | Status: AC
  Filled 2011-12-30: qty 2

## 2011-12-30 MED ORDER — ONDANSETRON HCL 4 MG/2ML IJ SOLN
INTRAMUSCULAR | Status: AC
  Filled 2011-12-30: qty 2

## 2011-12-30 MED ORDER — BUPIVACAINE IN DEXTROSE 0.75-8.25 % IT SOLN
INTRATHECAL | Status: DC | PRN
  Administered 2011-12-30: 15 mg via INTRATHECAL

## 2011-12-30 MED ORDER — GENTAMICIN IN SALINE 1.6-0.9 MG/ML-% IV SOLN
80.0000 mg | Freq: Once | INTRAVENOUS | Status: AC
  Administered 2011-12-30: 80 mg via INTRAVENOUS

## 2011-12-30 MED ORDER — FENTANYL CITRATE 0.05 MG/ML IJ SOLN: 25.0000 ug | INTRAMUSCULAR | Status: DC | PRN

## 2011-12-30 MED ORDER — STERILE WATER FOR IRRIGATION IR SOLN
Status: DC | PRN
  Administered 2011-12-30: 1000 mL

## 2011-12-30 MED ORDER — GLYCINE 1.5 % IR SOLN
Status: DC | PRN
  Administered 2011-12-30 (×4): 3000 mL

## 2011-12-30 MED ORDER — FENTANYL CITRATE 0.05 MG/ML IJ SOLN
INTRAMUSCULAR | Status: AC
  Filled 2011-12-30: qty 2

## 2011-12-30 MED ORDER — LACTATED RINGERS IV SOLN
INTRAVENOUS | Status: DC
  Administered 2011-12-30: 1000 mL via INTRAVENOUS
  Administered 2011-12-30: 13:00:00 via INTRAVENOUS

## 2011-12-30 MED ORDER — CEFAZOLIN SODIUM-DEXTROSE 2-3 GM-% IV SOLR
2.0000 g | Freq: Once | INTRAVENOUS | Status: AC
  Administered 2011-12-30: 2 g via INTRAVENOUS

## 2011-12-30 MED ORDER — GLYCINE 1.5 % IR SOLN
Status: DC | PRN
  Administered 2011-12-30: 3000 mL

## 2011-12-30 MED ORDER — ONDANSETRON HCL 4 MG/2ML IJ SOLN: 4.0000 mg | Freq: Once | INTRAMUSCULAR | Status: DC | PRN

## 2011-12-30 MED ORDER — PROPOFOL INFUSION 10 MG/ML OPTIME
INTRAVENOUS | Status: DC | PRN
  Administered 2011-12-30: 30 ug/kg/min via INTRAVENOUS

## 2011-12-30 MED ORDER — MIDAZOLAM HCL 5 MG/5ML IJ SOLN
INTRAMUSCULAR | Status: DC | PRN
  Administered 2011-12-30: 2 mg via INTRAVENOUS

## 2011-12-30 MED ORDER — FENTANYL CITRATE 0.05 MG/ML IJ SOLN
INTRAMUSCULAR | Status: DC | PRN
  Administered 2011-12-30: 25 ug via INTRAVENOUS
  Administered 2011-12-30: 25 ug via INTRATHECAL
  Administered 2011-12-30: 50 ug via INTRAVENOUS

## 2011-12-30 MED ORDER — ONDANSETRON HCL 4 MG/2ML IJ SOLN
4.0000 mg | Freq: Once | INTRAMUSCULAR | Status: AC
  Administered 2011-12-30: 4 mg via INTRAVENOUS

## 2011-12-30 MED ORDER — PROPOFOL 10 MG/ML IV BOLUS
INTRAVENOUS | Status: DC | PRN
  Administered 2011-12-30 (×2): 10 mg via INTRAVENOUS

## 2011-12-30 SURGICAL SUPPLY — 26 items
BAG DECANTER FOR FLEXI CONT (MISCELLANEOUS) ×2 IMPLANT
BAG DRAIN URO TABLE W/ADPT NS (DRAPE) ×2 IMPLANT
BAG URINE DRAINAGE (UROLOGICAL SUPPLIES) ×2 IMPLANT
CABLE HI FREQUENCY MONOPOLAR (ELECTROSURGICAL) ×2 IMPLANT
CATH FOLEY 2WAY SLVR  5CC 22FR (CATHETERS) ×1
CATH FOLEY 2WAY SLVR 5CC 22FR (CATHETERS) ×1 IMPLANT
CATH OPEN TIP 5FR (CATHETERS) ×2 IMPLANT
CLOTH BEACON ORANGE TIMEOUT ST (SAFETY) ×2 IMPLANT
ELECT CUT LOOP C-MAX 27FR .012 (CUTTING LOOP) ×2
ELECTRODE CUT LP CMX 27FR .012 (CUTTING LOOP) ×1 IMPLANT
FORMALIN 10 PREFIL 120ML (MISCELLANEOUS) ×2 IMPLANT
GLOVE BIO SURGEON STRL SZ7 (GLOVE) ×2 IMPLANT
GLOVE INDICATOR 6.5 STRL GRN (GLOVE) ×2 IMPLANT
GLOVE INDICATOR 7.0 STRL GRN (GLOVE) ×6 IMPLANT
GLOVE SS BIOGEL STRL SZ 6.5 (GLOVE) ×3 IMPLANT
GLOVE SUPERSENSE BIOGEL SZ 6.5 (GLOVE) ×3
GLYCINE 1.5% IRRIG UROMATIC (IV SOLUTION) ×10 IMPLANT
GOWN STRL REIN XL XLG (GOWN DISPOSABLE) ×6 IMPLANT
KIT ROOM TURNOVER AP CYSTO (KITS) ×2 IMPLANT
KNIFE DISP COLD (ELECTROSURGICAL) ×2 IMPLANT
MANIFOLD NEPTUNE II (INSTRUMENTS) ×2 IMPLANT
PACK CYSTO (CUSTOM PROCEDURE TRAY) ×2 IMPLANT
PAD ARMBOARD 7.5X6 YLW CONV (MISCELLANEOUS) ×2 IMPLANT
SYRINGE 10CC LL (SYRINGE) ×2 IMPLANT
TOWEL OR 17X26 4PK STRL BLUE (TOWEL DISPOSABLE) ×2 IMPLANT
WATER STERILE IRR 1000ML POUR (IV SOLUTION) ×2 IMPLANT

## 2011-12-30 NOTE — Brief Op Note (Signed)
12/30/2011  1:08 PM  PATIENT:  Mitchell [name]  66 y.o. male  PRE-OPERATIVE DIAGNOSIS:  Prostate cancer, bladder neck stenosis, post radical retropubic prostatectomy.   POST-OPERATIVE DIAGNOSIS:  * No post-op diagnosis entered *  PROCEDURE:  Procedure(s) (LRB) with comments: CYSTOSCOPY WITH URETHRAL DILATATION () TRANSURETHRAL INCISION OF BLADDER NECK () CYSTOSCOPY/URETHROTOMY () FOREIGN BODY REMOVAL () - Removal of 4 clips from bladder neck  SURGEON:  Surgeon(s) and Role:    * Ky Barban, MD - Primary  PHYSICIAN ASSISTANT:   ASSISTANTS: none   ANESTHESIA:   spinal  EBL:  Total I/O In: 1000 [I.V.:1000] Out: -   BLOOD ADMINISTERED:none  DRAINS: Penrose drain in the foley catheter   LOCAL MEDICATIONS USED:  NONE  SPECIMEN:  Source of Specimen:  bladder neck and 4 clips  DISPOSITION OF SPECIMEN:  PATHOLOGY  COUNTS:  YES  TOURNIQUET:  * No tourniquets in log *  DICTATION: .Other Dictation: Dictation Number dictation # [id number]  PLAN OF CARE: Discharge to home after PACU  PATIENT DISPOSITION:  PACU - hemodynamically stable.   Delay start of Pharmacological VTE agent (>24hrs) due to surgical blood loss or risk of bleeding:

## 2011-12-30 NOTE — Progress Notes (Signed)
No change in H&P. i may resect bladder nck or use laser to incise bladder neck.

## 2011-12-30 NOTE — Anesthesia Postprocedure Evaluation (Signed)
  Anesthesia Post-op Note  Patient: Mitchell [name]  Procedure(s) Performed: Procedure(s) (LRB) with comments: CYSTOSCOPY WITH URETHRAL DILATATION () TRANSURETHRAL INCISION OF BLADDER NECK () CYSTOSCOPY/URETHROTOMY () FOREIGN BODY REMOVAL () - Removal of 4 clips from bladder neck  Patient Location: PACU  Anesthesia Type: Spinal  Level of Consciousness: awake, alert  and oriented  Airway and Oxygen Therapy: Patient Spontanous Breathing and Patient connected to nasal cannula oxygen  Post-op Pain: none  Post-op Assessment: Post-op Vital signs reviewed, Patient's Cardiovascular Status Stable, Respiratory Function Stable, Patent Airway and No signs of Nausea or vomiting  Post-op Vital Signs: Reviewed and stable  Complications: No apparent anesthesia complications

## 2011-12-30 NOTE — Transfer of Care (Signed)
Immediate Anesthesia Transfer of Care Note  Patient: Mitchell [name]  Procedure(s) Performed: Procedure(s) (LRB) with comments: CYSTOSCOPY WITH URETHRAL DILATATION () TRANSURETHRAL INCISION OF BLADDER NECK () CYSTOSCOPY/URETHROTOMY () FOREIGN BODY REMOVAL () - Removal of 4 clips from bladder neck  Patient Location: PACU  Anesthesia Type: Spinal  Level of Consciousness: awake, alert  and oriented  Airway & Oxygen Therapy: Patient Spontanous Breathing and Patient connected to nasal cannula oxygen  Post-op Assessment: Report given to PACU RN  Post vital signs: Reviewed and stable  Complications: No apparent anesthesia complications

## 2011-12-30 NOTE — H&P (Signed)
NAME:  Mitchell [name], POUNDERS NO.:  [id number]  MEDICAL RECORD NO.:  [id number]  LOCATION:                                 FACILITY:  PHYSICIAN:  Ky Barban, M.D.DATE OF BIRTH:  1945-05-06  DATE OF ADMISSION:  12/30/2011 DATE OF DISCHARGE:  09/03/2013LH                             HISTORY & PHYSICAL   HISTORY OF PRESENT ILLNESS:  This is a 66 year old gentleman well known to me has developed bladder neck stenosis.  He had a radical retropubic prostatectomy done in [facility] in 1999 for prostate cancer.  Now periodically he comes and I have to dilate his bladder neck.  Today, he is having lot of difficulty voiding, and I tried to dilate him in the office.  Unable to dilate him with Filiform maybe first Filiform I was able to dilate.  I even cannot get that next Filiform in.  It is so tight, so I have advised him to have this thing done under anesthesia as an outpatient coming Tuesday.  He is not having any fever except this voiding difficulty, and I have done TUR bladder neck in the past, and he is taking blood thinner because of other medical problems, and I have told him to stopped that.  PAST MEDICAL HISTORY:  It includes he has multiple medical problem which include COPD, uses oxygen at home, and also has hypertension.  No diabetes.  FAMILY HISTORY:  Father had prostate cancer.  PERSONAL HISTORY:  He does not smoke or drink.  REVIEW OF SYSTEMS:  Unremarkable.  PHYSICAL EXAMINATION:  GENERAL:  Moderately obese male, not in acute distress, fully conscious, alert, and oriented. VITAL SIGNS:  Blood pressure 120/80.  Temperature is normal. CENTRAL NERVOUS SYSTEM:  No gross neurological deficit. HEAD, NECK, EYE, ENT:  Negative. CHEST:  Symmetrical.  Normal breath sounds.  Regular sinus rhythm.  No murmur. ABDOMEN:  Soft and flat.  Liver, spleen, kidneys are not palpable.  No CVA tenderness. GU:  External genitalia is unremarkable. RECTAL:   Deferred.  IMPRESSION:  Bladder neck stenosis status post radical retropubic prostatectomy and difficulty to void.  Probably recurrent bladder neck stenosis.  PLAN:  Cysto dilation under anesthesia as an outpatient.     Ky Barban, M.D.     MIJ/MEDQ  D:  12/25/2011  T:  12/26/2011  Job:  [id number]

## 2011-12-30 NOTE — Anesthesia Procedure Notes (Signed)
Spinal  Patient location during procedure: OR Start time: 12/30/2011 11:57 AM Preanesthetic Checklist Completed: patient identified, site marked, surgical consent, pre-op evaluation, timeout performed, IV checked, risks and benefits discussed and monitors and equipment checked Spinal Block Patient position: right lateral decubitus Prep: Betadine Patient monitoring: heart rate, cardiac monitor, continuous pulse ox and blood pressure Approach: right paramedian Location: L3-4 Injection technique: single-shot Needle Needle type: Spinocan  Needle gauge: 22 G Needle length: 9 cm Assessment Sensory level: T8 Additional Notes ATTEMPTS:1 TRAY JX:[id number] TRAY EXPIRATION DATE:10/2012

## 2011-12-30 NOTE — Anesthesia Preprocedure Evaluation (Addendum)
Anesthesia Evaluation  Patient identified by MRN, date of birth, ID band Patient awake    Reviewed: Allergy & Precautions, H&P , NPO status , Patient's Chart, lab work & pertinent test results  Airway Mallampati: III TM Distance: >3 FB Neck ROM: Full    Dental  (+) Edentulous Upper and Partial Lower   Pulmonary pneumonia -, COPD oxygen dependent, Current Smoker (>60 py hx),  breath sounds clear to auscultation        Cardiovascular hypertension, Pt. on medications + Past MI, +CHF, + Orthopnea and + DOE + dysrhythmias Atrial Fibrillation Rhythm:Irregular Rate:Tachycardia     Neuro/Psych PSYCHIATRIC DISORDERS Anxiety Depression    GI/Hepatic   Endo/Other    Renal/GU Renal disease     Musculoskeletal   Abdominal   Peds  Hematology   Anesthesia Other Findings   Reproductive/Obstetrics                          Anesthesia Physical Anesthesia Plan  ASA: III  Anesthesia Plan: Spinal   Post-op Pain Management:    Induction:   Airway Management Planned: Nasal Cannula  Additional Equipment:   Intra-op Plan:   Post-operative Plan:   Informed Consent: I have reviewed the patients History and Physical, chart, labs and discussed the procedure including the risks, benefits and alternatives for the proposed anesthesia with the patient or authorized representative who has indicated his/her understanding and acceptance.     Plan Discussed with:   Anesthesia Plan Comments:         Anesthesia Quick Evaluation

## 2011-12-31 NOTE — Op Note (Signed)
NAME:  Mitchell, [name] NO.:  [id number]  MEDICAL RECORD NO.:  [id number]  LOCATION:  [facility]                          FACILITY:  [facility]  PHYSICIAN:  Ky Barban, M.D.DATE OF BIRTH:  [birth year]  DATE OF PROCEDURE:  12/30/2011 DATE OF DISCHARGE:  12/30/2011                              OPERATIVE REPORT   PREOPERATIVE DIAGNOSIS:  Bladder neck stenosis, prostate cancer.  PROCEDURE:  Optical urethrotomy, transurethral resection of bladder neck, removal of the clips from the bladder neck.  ANESTHESIA:  Spinal.  PROCEDURE:  The patient under spinal anesthesia in lithotomy position, after usual prep and drape, I introduced optical urethrotome under direct vision, and I can see the bladder neck is very tight.  I was only able to put #4 whistle-tip catheter through it.  Once the catheter was in the bladder, I incised the bladder neck at 12, 6 and 9 o'clock position.  Then, I was able to advance the urethrotome into the bladder. Then, I removed the urethrotome and dilated the bladder neck to 9- [place] with Sissy Hoff sounds and then resectoscope sheath #28 is introduced into the bladder.  Bladder was inspected thoroughly.  The bladder neck is circumferentially resected.  I resected it deep, then I met several clips at 5 o'clock position, there were at least 4 of them. They were removed without any difficulty, and I dissected the bladder neck deep at that point.  I did not resect the bladder neck between 11 and 2 o'clock position anteriorly.  The remainder of the bladder neck was resected.  Wide open bladder neck is obtained. Bladder neck is moved.  All the chips were removed from the bladder.  At the end, there was some bleeders on the bladder neck that were simply fulgurated. Resectoscope was removed and 22-Foley catheter left in for drainage which was clear.  The patient left the operating room in satisfactory condition.     Ky Barban,  M.D.     MIJ/MEDQ  D:  12/30/2011  T:  12/31/2011  Job:  [id number]

## 2012-01-02 ENCOUNTER — Encounter (HOSPITAL_COMMUNITY): Payer: Self-pay | Admitting: Urology

## 2012-01-05 NOTE — Discharge Summary (Signed)
Physician Discharge Summary  Patient ID: DEVESH MONFORTE MRN: [id number] DOB/AGE: [birth year] 66 y.o.  Admit date: 12/26/2011 Discharge date: 12/27/11  Discharge Diagnoses:  Principal Problem:  *bladder neck stenosis Active Problems:  HYPERTENSION  PROSTATE CANCER, HX OF  UTI (urinary tract infection)  Chronic systolic heart failure  CLL  AF (atrial fibrillation)  COPD (chronic obstructive pulmonary disease)   Medication List  As of 01/05/2012 10:23 AM   TAKE these medications         ADVAIR DISKUS 250-50 MCG/DOSE Aepb   Generic drug: Fluticasone-Salmeterol   Inhale 1 puff into the lungs every 12 (twelve) hours.      albuterol 108 (90 BASE) MCG/ACT inhaler   Commonly known as: PROVENTIL HFA;VENTOLIN HFA   Inhale 2 puffs into the lungs every 6 (six) hours as needed. For shortness of breath      ALPRAZolam 1 MG tablet   Commonly known as: XANAX   Take 1 mg by mouth at bedtime as needed. For sleep      ciprofloxacin 500 MG tablet   Commonly known as: CIPRO   Take 1 tablet (500 mg total) by mouth 2 (two) times daily.      citalopram 40 MG tablet   Commonly known as: CELEXA   Take 40 mg by mouth daily.      digoxin 0.25 MG tablet   Commonly known as: LANOXIN   Take 1 tablet (0.25 mg total) by mouth daily.      diltiazem 360 MG 24 hr capsule   Commonly known as: CARDIZEM CD   Take 1 capsule (360 mg total) by mouth daily.      docusate sodium 100 MG capsule   Commonly known as: COLACE   Take 100 mg by mouth as needed. For constipation      furosemide 40 MG tablet   Commonly known as: LASIX   Take 1 tablet (40 mg total) by mouth 2 (two) times daily.      imatinib 400 MG tablet   Commonly known as: GLEEVEC   Take 400 mg by mouth every evening. Take with meals and large glass of water.Caution:Chemotherapy.      lisinopril 40 MG tablet   Commonly known as: PRINIVIL,ZESTRIL   Take 40 mg by mouth daily.      LUPRON IJ   Inject 1 each as directed every 4 (four)  months.      metoprolol succinate 25 MG 24 hr tablet   Commonly known as: TOPROL-XL   Take 25 mg by mouth daily.      mometasone 0.1 % cream   Commonly known as: ELOCON   Apply 1 application topically daily as needed. Skin lesions      oxyCODONE 40 MG 12 hr tablet   Commonly known as: OXYCONTIN   Take 40 mg by mouth every 12 (twelve) hours.      oxycodone 5 MG capsule   Commonly known as: OXY-IR   Take 5 mg by mouth every 4 (four) hours as needed. For breakthrough pain      potassium chloride 10 MEQ CR tablet   Commonly known as: KLOR-CON   Take 4 tablets (40 mEq total) by mouth 2 (two) times daily.      venlafaxine 75 MG tablet   Commonly known as: EFFEXOR   Take 75 mg by mouth 2 (two) times daily.      XARELTO 20 MG Tabs   Generic drug: Rivaroxaban   Take 1 tablet by mouth daily.  zolpidem 10 MG tablet   Commonly known as: AMBIEN   Take 10 mg by mouth at bedtime.          Cipro 500 mg po bid, prescribed by Dr. Jerre Simon  Discharge Orders    Future Orders Please Complete By Expires   Diet - low sodium heart healthy      Increase activity slowly      Discharge instructions      Comments:   F/U with Dr. Frann Rider as directed      Disposition: 01-Home or Self Care  Discharged Condition: stable  Consults: Treatment Team:  Ky Barban, MD  Labs:    Sodium 140        Potassium 3.9        Chloride 106        CO2 30        BUN 21        Creatinine, Ser 1.02        Calcium 9.0        GFR calc non Af Amer 75        GFR calc Af Amer 86         Glucose, Bld 118        Alkaline Phosphatase 82        Albumin 3.4        AST 17        ALT 19        Total Protein 6.3        Total Bilirubin 0.3         CARDIAC PROFILE    Troponin i, poc 0.01         CBC    WBC 7.9        RBC 3.86        Hemoglobin 11.7        HCT 36.3        MCV 94.0        MCH 30.3        MCHC 32.2        RDW 15.0        Platelets 294         DIFFERENTIAL    Neutrophils  Relative 56        Lymphocytes Relative 28        Monocytes Relative 7        Eosinophils Relative 9        Basophils Relative 1        Neutro Abs 4.4        Lymphs Abs 2.2        Monocytes Absolute 0.6        Eosinophils Absolute 0.7        Basophils Absolute 0.0         COAG OTHER    D-Dimer, Quant 0.49          PROTIME W/ INR    Prothrombin Time 16.8        INR 1.34         PTT    aPTT 39          DIABETES    Glucose, Bld 118         URINALYSIS    Color, Urine YELLOW        APPearance HAZY        Specific Gravity, Urine 1.015        pH 6.0        Glucose, UA NEGATIVE  Bilirubin Urine NEGATIVE        Ketones, ur NEGATIVE        Protein, ur NEGATIVE        Urobilinogen, UA 0.2        Nitrite POSITIVE        Leukocytes, UA SMALL        Hgb urine dipstick MODERATE        WBC, UA TOO NUMEROUS TO COUNT        RBC / HPF 7-10        Bacteria, UA MANY         Diagnostics:  Dg Chest Port 1 View  12/26/2011  *RADIOLOGY REPORT*  Clinical Data: Acute urinary retention, history CHF, COPD, hypertension, leukemia, prostate cancer, colon cancer  PORTABLE CHEST - 1 VIEW  Comparison: Portable exam 1419 hours compared to 08/23/2011  Findings: Enlargement of cardiac silhouette. Mediastinal contours and pulmonary vascularity normal for lordotic technique. Bibasilar atelectasis greater on left. Remaining lungs clear. Mild chronic peribronchial thickening. No pleural effusion or pneumothorax. Bones demineralized.  IMPRESSION: Enlargement of cardiac silhouette. Bibasilar atelectasis greater on left. Minimal chronic bronchitic changes.   Original Report Authenticated By: Lollie Marrow, M.D.     Procedures:  none  Hospital Course: See H&P for complete admission details.  The patient is a 66 year old white mail with a history of prostate cancer, previous prostatectomy.  He presented with difficulty voiding.  He was able dribble.  He had unremarkable vital signs, unremarkable  physical exam.  The EDP spoke with Dr. Jerre Simon who requested the hospitalists admit, and he would consult.    Dr. Jerre Simon reportedly cleared the patient for discharge, and the patient demanded such.  Dr. Phillips Odor, covering physician entered the discharge order.  I called the patients residence the following morning and spoke to patient's wife.  Dr. Jerre Simon prescribed cipro and will do procedure on Tuesday.  I also spoke to Dr. Jerre Simon the next morning and he confirmed the events overnight, and the plan for outpatient dilitation.  SignedChristiane Ha 01/05/2012, 10:23 AM

## 2012-05-11 DIAGNOSIS — I509 Heart failure, unspecified: Secondary | ICD-10-CM

## 2012-07-15 ENCOUNTER — Other Ambulatory Visit (HOSPITAL_COMMUNITY): Payer: Medicare Other

## 2012-07-21 ENCOUNTER — Encounter (HOSPITAL_COMMUNITY): Payer: Self-pay | Admitting: Pharmacy Technician

## 2012-07-21 ENCOUNTER — Encounter (HOSPITAL_COMMUNITY)
Admission: RE | Admit: 2012-07-21 | Discharge: 2012-07-21 | Disposition: A | Discharge status: Home / Self Care | Payer: Medicare Other | Source: Ambulatory Visit | Attending: Urology | Admitting: Urology

## 2012-07-21 ENCOUNTER — Encounter (HOSPITAL_COMMUNITY): Payer: Self-pay

## 2012-07-21 HISTORY — DX: Anxiety disorder, unspecified: F41.9

## 2012-07-21 NOTE — Patient Instructions (Addendum)
Your procedure is scheduled on: 07/27/2012  Report to [facility] at   7:30 AM.  Call this number if you have problems the morning of surgery: [phone]   Remember:   Do not drink or eat food:After Midnight.  :  Take these medicines the morning of surgery with A SIP OF WATER: Lanoxin, Diltiazem, Lisinopril, Metoprolol, Citalopram and Venlafaxine. You may take your Oxycodone if needed. Use your Albuterol, Advair, Symbicort and Duoneb before coming for surgery also.   Do not wear jewelry, make-up or nail polish.  Do not wear lotions, powders, or perfumes.  Do not shave 48 hours prior to surgery. Men may shave face and neck.  Do not bring valuables to the hospital.  Contacts, dentures or bridgework may not be worn into surgery.  Leave suitcase in the car. After surgery it may be brought to your room.    Patients discharged the day of surgery will not be allowed to drive home.    Special Instructions: Shower using CHG 2 nights before surgery and the night before surgery.  If you shower the day of surgery use CHG.  Use special wash - you have one bottle of CHG for all showers.  You should use approximately 1/3 of the bottle for each shower.   Please read over the following fact sheets that you were given: Pain Booklet, MRSA Information, Surgical Site Infection Prevention and Care and Recovery After Surgery    Cystoscopy (Bladder Exam) Care After Refer to this sheet in the next few weeks. These discharge instructions provide you with general information on caring for yourself after you leave the hospital. Your caregiver may also give you specific instructions. Your treatment has been planned according to the most current medical practices available, but unavoidable complications sometimes occur. If you have any problems or questions after discharge, please call your caregiver. AFTER THE PROCEDURE   There may be temporary bleeding and burning with urination.  Drink enough water and fluids to  keep your urine clear or pale yellow. FINDING OUT THE RESULTS OF YOUR TEST Not all test results are available during your visit. If your test results are not back during the visit, make an appointment with your caregiver to find out the results. Do not assume everything is normal if you have not heard from your caregiver or the medical facility. It is important for you to follow up on all of your test results. SEEK IMMEDIATE MEDICAL CARE IF:   There is an increase in blood in the urine or you are passing clots.  There is difficulty passing urine.  You develop the chills.  You have an oral temperature above 102 F (38.9 C), not controlled by medicine.  Belly (abdominal) pain develops. Document Released: 11/01/2004 Document Revised: 07/07/2011 Document Reviewed: 10/06/2011 [facility] Patient Information 2013 [place], [place].   Spinal Anesthesia Care After HOME CARE INSTRUCTIONS  Do not drive or operate machinery for at least 24 hours after receiving anesthesia. Make sure someone is available to drive you home.  Do not drink alcohol for at least 24 hours after receiving anesthesia.  Do not make important decisions for 24 hours after having spinal or epidural anesthesia. Your thinking may be unclear.  Have someone stay with you for at least 24 to 48 hours following surgery.  Drink lots of fluids when you get home. If you are an adult, drink eight, 8 ounce glasses of water per day, or as directed.  Keep your post-operative appointments as suggested. SEEK IMMEDIATE  MEDICAL CARE IF:   You develop a fever or any temperature over 100.4 F (38 C).  You have a persistent or severe headache.  You develop blurred or double vision.  You develop dizziness, fainting or lightheadedness.  You have weakness, numbness, or tingling in your arms or legs.  You develop a skin rash.  You have difficulty breathing.  You have persistent nausea and vomiting.  You are unable to pass  urine. Document Released: 07/05/2003 Document Revised: 07/07/2011 Document Reviewed: 05/18/2007 [facility] Patient Information 2013 [place], [place].

## 2012-07-27 ENCOUNTER — Observation Stay (HOSPITAL_COMMUNITY)
Admission: RE | Admit: 2012-07-27 | Discharge: 2012-07-28 | Disposition: A | Discharge status: Skilled Nursing Facility | Payer: Medicare Other | Source: Ambulatory Visit | Attending: Urology | Admitting: Urology

## 2012-07-27 ENCOUNTER — Encounter (HOSPITAL_COMMUNITY)
Admission: RE | Disposition: A | Discharge status: Skilled Nursing Facility | Payer: Self-pay | Source: Ambulatory Visit | Attending: Urology

## 2012-07-27 ENCOUNTER — Encounter (HOSPITAL_COMMUNITY): Payer: Self-pay | Admitting: Anesthesiology

## 2012-07-27 ENCOUNTER — Ambulatory Visit (HOSPITAL_COMMUNITY): Payer: Medicare Other | Admitting: Anesthesiology

## 2012-07-27 ENCOUNTER — Encounter (HOSPITAL_COMMUNITY): Payer: Self-pay | Admitting: *Deleted

## 2012-07-27 DIAGNOSIS — F329 Major depressive disorder, single episode, unspecified: Secondary | ICD-10-CM

## 2012-07-27 DIAGNOSIS — I5022 Chronic systolic (congestive) heart failure: Secondary | ICD-10-CM

## 2012-07-27 DIAGNOSIS — C61 Malignant neoplasm of prostate: Secondary | ICD-10-CM | POA: Insufficient documentation

## 2012-07-27 DIAGNOSIS — N390 Urinary tract infection, site not specified: Secondary | ICD-10-CM

## 2012-07-27 DIAGNOSIS — I4891 Unspecified atrial fibrillation: Secondary | ICD-10-CM | POA: Insufficient documentation

## 2012-07-27 DIAGNOSIS — Z862 Personal history of diseases of the blood and blood-forming organs and certain disorders involving the immune mechanism: Secondary | ICD-10-CM

## 2012-07-27 DIAGNOSIS — Z8546 Personal history of malignant neoplasm of prostate: Secondary | ICD-10-CM

## 2012-07-27 DIAGNOSIS — K589 Irritable bowel syndrome without diarrhea: Secondary | ICD-10-CM

## 2012-07-27 DIAGNOSIS — F411 Generalized anxiety disorder: Secondary | ICD-10-CM

## 2012-07-27 DIAGNOSIS — J40 Bronchitis, not specified as acute or chronic: Secondary | ICD-10-CM

## 2012-07-27 DIAGNOSIS — J449 Chronic obstructive pulmonary disease, unspecified: Secondary | ICD-10-CM | POA: Insufficient documentation

## 2012-07-27 DIAGNOSIS — I509 Heart failure, unspecified: Secondary | ICD-10-CM

## 2012-07-27 DIAGNOSIS — K5900 Constipation, unspecified: Secondary | ICD-10-CM

## 2012-07-27 DIAGNOSIS — N320 Bladder-neck obstruction: Principal | ICD-10-CM | POA: Insufficient documentation

## 2012-07-27 DIAGNOSIS — R338 Other retention of urine: Secondary | ICD-10-CM

## 2012-07-27 DIAGNOSIS — I10 Essential (primary) hypertension: Secondary | ICD-10-CM

## 2012-07-27 DIAGNOSIS — C9110 Chronic lymphocytic leukemia of B-cell type not having achieved remission: Secondary | ICD-10-CM

## 2012-07-27 DIAGNOSIS — J4489 Other specified chronic obstructive pulmonary disease: Secondary | ICD-10-CM | POA: Insufficient documentation

## 2012-07-27 DIAGNOSIS — E669 Obesity, unspecified: Secondary | ICD-10-CM

## 2012-07-27 DIAGNOSIS — I252 Old myocardial infarction: Secondary | ICD-10-CM

## 2012-07-27 DIAGNOSIS — R32 Unspecified urinary incontinence: Secondary | ICD-10-CM

## 2012-07-27 HISTORY — PX: TRANSURETHRAL RESECTION OF BLADDER NECK: SHX6196

## 2012-07-27 HISTORY — PX: FOREIGN BODY REMOVAL: SHX962

## 2012-07-27 HISTORY — PX: CYSTOSCOPY WITH URETHRAL DILATATION: SHX5125

## 2012-07-27 SURGERY — RESECTION, BLADDER NECK, TRANSURETHRAL
Anesthesia: Spinal | Site: Bladder | Wound class: Clean Contaminated

## 2012-07-27 MED ORDER — FLUTICASONE-SALMETEROL 250-50 MCG/DOSE IN AEPB
1.0000 | INHALATION_SPRAY | Freq: Two times a day (BID) | RESPIRATORY_TRACT | Status: DC
  Filled 2012-07-27: qty 14

## 2012-07-27 MED ORDER — CITALOPRAM HYDROBROMIDE 20 MG PO TABS
40.0000 mg | ORAL_TABLET | Freq: Every day | ORAL | Status: DC
  Administered 2012-07-27 – 2012-07-28 (×2): 40 mg via ORAL
  Filled 2012-07-27: qty 1
  Filled 2012-07-27 (×2): qty 2

## 2012-07-27 MED ORDER — OXYCODONE-ACETAMINOPHEN 5-325 MG PO TABS: 1.0000 | ORAL_TABLET | Freq: Three times a day (TID) | ORAL | Status: DC | PRN

## 2012-07-27 MED ORDER — DOCUSATE SODIUM 100 MG PO CAPS: 100.0000 mg | ORAL_CAPSULE | ORAL | Status: DC | PRN

## 2012-07-27 MED ORDER — VENLAFAXINE HCL ER 75 MG PO CP24: 75.0000 mg | ORAL_CAPSULE | Freq: Every day | ORAL | Status: DC

## 2012-07-27 MED ORDER — LACTATED RINGERS IV SOLN
INTRAVENOUS | Status: DC
  Administered 2012-07-27 (×2): via INTRAVENOUS

## 2012-07-27 MED ORDER — FUROSEMIDE 40 MG PO TABS
40.0000 mg | ORAL_TABLET | Freq: Two times a day (BID) | ORAL | Status: DC
  Administered 2012-07-27 – 2012-07-28 (×2): 40 mg via ORAL
  Filled 2012-07-27 (×2): qty 1

## 2012-07-27 MED ORDER — ALBUTEROL SULFATE HFA 108 (90 BASE) MCG/ACT IN AERS: 2.0000 | INHALATION_SPRAY | Freq: Four times a day (QID) | RESPIRATORY_TRACT | Status: DC | PRN

## 2012-07-27 MED ORDER — VENLAFAXINE HCL ER 75 MG PO CP24
75.0000 mg | ORAL_CAPSULE | Freq: Every day | ORAL | Status: DC
  Administered 2012-07-27 – 2012-07-28 (×2): 75 mg via ORAL
  Filled 2012-07-27 (×2): qty 1

## 2012-07-27 MED ORDER — GLYCINE 1.5 % IR SOLN
Status: DC | PRN
  Administered 2012-07-27: 3000 mL

## 2012-07-27 MED ORDER — MOMETASONE FUROATE 0.1 % EX CREA: 1.0000 "application " | TOPICAL_CREAM | Freq: Every day | CUTANEOUS | Status: DC | PRN

## 2012-07-27 MED ORDER — POLYETHYLENE GLYCOL 3350 17 GM/SCOOP PO POWD
17.0000 g | Freq: Every day | ORAL | Status: DC
  Filled 2012-07-27: qty 255

## 2012-07-27 MED ORDER — IPRATROPIUM-ALBUTEROL 0.5-2.5 (3) MG/3ML IN SOLN: 3.0000 mL | Freq: Four times a day (QID) | RESPIRATORY_TRACT | Status: DC | PRN

## 2012-07-27 MED ORDER — MIDAZOLAM HCL 2 MG/2ML IJ SOLN
INTRAMUSCULAR | Status: AC
  Filled 2012-07-27: qty 2

## 2012-07-27 MED ORDER — LISINOPRIL 40 MG PO TABS: 40.0000 mg | ORAL_TABLET | Freq: Every day | ORAL | Status: DC

## 2012-07-27 MED ORDER — MOMETASONE FUROATE 0.1 % EX CREA
1.0000 "application " | TOPICAL_CREAM | Freq: Every day | CUTANEOUS | Status: DC | PRN
  Administered 2012-07-27: 1 via TOPICAL
  Filled 2012-07-27: qty 15

## 2012-07-27 MED ORDER — ONDANSETRON HCL 4 MG/2ML IJ SOLN: 4.0000 mg | Freq: Once | INTRAMUSCULAR | Status: DC | PRN

## 2012-07-27 MED ORDER — DEXTROSE-NACL 5-0.45 % IV SOLN: INTRAVENOUS | Status: DC

## 2012-07-27 MED ORDER — DIGOXIN 250 MCG PO TABS
0.2500 mg | ORAL_TABLET | Freq: Every day | ORAL | Status: DC
  Administered 2012-07-27 – 2012-07-28 (×2): 0.25 mg via ORAL
  Filled 2012-07-27 (×2): qty 1

## 2012-07-27 MED ORDER — BUDESONIDE-FORMOTEROL FUMARATE 160-4.5 MCG/ACT IN AERO
2.0000 | INHALATION_SPRAY | Freq: Two times a day (BID) | RESPIRATORY_TRACT | Status: DC
  Administered 2012-07-27 – 2012-07-28 (×2): 2 via RESPIRATORY_TRACT
  Filled 2012-07-27: qty 6

## 2012-07-27 MED ORDER — FENTANYL CITRATE 0.05 MG/ML IJ SOLN
INTRAMUSCULAR | Status: DC | PRN
  Administered 2012-07-27 (×3): 25 ug via INTRAVENOUS

## 2012-07-27 MED ORDER — METOPROLOL SUCCINATE ER 50 MG PO TB24
50.0000 mg | ORAL_TABLET | Freq: Every day | ORAL | Status: DC
  Administered 2012-07-27 – 2012-07-28 (×2): 50 mg via ORAL
  Filled 2012-07-27 (×2): qty 1

## 2012-07-27 MED ORDER — OXYCODONE-ACETAMINOPHEN 5-325 MG PO TABS
1.0000 | ORAL_TABLET | Freq: Three times a day (TID) | ORAL | Status: DC | PRN
  Administered 2012-07-27 – 2012-07-28 (×4): 1 via ORAL
  Filled 2012-07-27 (×4): qty 1

## 2012-07-27 MED ORDER — EPHEDRINE SULFATE 50 MG/ML IJ SOLN
INTRAMUSCULAR | Status: AC
  Filled 2012-07-27: qty 1

## 2012-07-27 MED ORDER — POTASSIUM CHLORIDE CRYS ER 10 MEQ PO TBCR: 10.0000 meq | EXTENDED_RELEASE_TABLET | Freq: Two times a day (BID) | ORAL | Status: DC

## 2012-07-27 MED ORDER — FUROSEMIDE 40 MG PO TABS: 40.0000 mg | ORAL_TABLET | Freq: Two times a day (BID) | ORAL | Status: DC

## 2012-07-27 MED ORDER — ALPRAZOLAM 0.25 MG PO TABS: 0.2500 mg | ORAL_TABLET | Freq: Every evening | ORAL | Status: DC | PRN

## 2012-07-27 MED ORDER — RIVAROXABAN 10 MG PO TABS
20.0000 mg | ORAL_TABLET | Freq: Every day | ORAL | Status: DC
  Administered 2012-07-27 – 2012-07-28 (×2): 20 mg via ORAL
  Filled 2012-07-27 (×2): qty 2

## 2012-07-27 MED ORDER — DIGOXIN 250 MCG PO TABS: 0.2500 mg | ORAL_TABLET | Freq: Every day | ORAL | Status: DC

## 2012-07-27 MED ORDER — PROPOFOL INFUSION 10 MG/ML OPTIME
INTRAVENOUS | Status: DC | PRN
  Administered 2012-07-27: 25 ug/kg/min via INTRAVENOUS

## 2012-07-27 MED ORDER — STERILE WATER FOR IRRIGATION IR SOLN
Status: DC | PRN
  Administered 2012-07-27: 1000 mL

## 2012-07-27 MED ORDER — PROPOFOL 10 MG/ML IV EMUL
INTRAVENOUS | Status: AC
  Filled 2012-07-27: qty 20

## 2012-07-27 MED ORDER — BUPIVACAINE IN DEXTROSE 0.75-8.25 % IT SOLN
INTRATHECAL | Status: AC
  Filled 2012-07-27: qty 2

## 2012-07-27 MED ORDER — DILTIAZEM HCL ER COATED BEADS 180 MG PO CP24
180.0000 mg | ORAL_CAPSULE | Freq: Every day | ORAL | Status: DC
  Administered 2012-07-27 – 2012-07-28 (×2): 180 mg via ORAL
  Filled 2012-07-27 (×2): qty 1

## 2012-07-27 MED ORDER — ALPRAZOLAM 0.25 MG PO TABS
0.2500 mg | ORAL_TABLET | Freq: Every evening | ORAL | Status: DC | PRN
  Administered 2012-07-27: 0.25 mg via ORAL
  Filled 2012-07-27: qty 1

## 2012-07-27 MED ORDER — DILTIAZEM HCL ER COATED BEADS 180 MG PO CP24: 180.0000 mg | ORAL_CAPSULE | Freq: Every day | ORAL | Status: DC

## 2012-07-27 MED ORDER — BUDESONIDE-FORMOTEROL FUMARATE 160-4.5 MCG/ACT IN AERO: 2.0000 | INHALATION_SPRAY | Freq: Two times a day (BID) | RESPIRATORY_TRACT | Status: DC

## 2012-07-27 MED ORDER — EPHEDRINE SULFATE 50 MG/ML IJ SOLN
INTRAMUSCULAR | Status: DC | PRN
  Administered 2012-07-27: 10 mg via INTRAVENOUS
  Administered 2012-07-27: 5 mg via INTRAVENOUS
  Administered 2012-07-27 (×3): 10 mg via INTRAVENOUS
  Administered 2012-07-27: 5 mg via INTRAVENOUS
  Administered 2012-07-27: 10 mg via INTRAVENOUS

## 2012-07-27 MED ORDER — POLYETHYLENE GLYCOL 3350 17 GM/SCOOP PO POWD: 17.0000 g | Freq: Every day | ORAL | Status: DC

## 2012-07-27 MED ORDER — IPRATROPIUM BROMIDE 0.02 % IN SOLN: 0.5000 mg | Freq: Four times a day (QID) | RESPIRATORY_TRACT | Status: DC | PRN

## 2012-07-27 MED ORDER — ASPIRIN EC 81 MG PO TBEC
81.0000 mg | DELAYED_RELEASE_TABLET | Freq: Every day | ORAL | Status: DC
  Administered 2012-07-27 – 2012-07-28 (×2): 81 mg via ORAL
  Filled 2012-07-27 (×2): qty 1

## 2012-07-27 MED ORDER — GLYCINE 1.5 % IR SOLN
Status: DC | PRN
  Administered 2012-07-27 (×5): 3000 mL

## 2012-07-27 MED ORDER — FENTANYL CITRATE 0.05 MG/ML IJ SOLN: 25.0000 ug | INTRAMUSCULAR | Status: DC | PRN

## 2012-07-27 MED ORDER — LISINOPRIL 10 MG PO TABS
40.0000 mg | ORAL_TABLET | Freq: Every day | ORAL | Status: DC
  Administered 2012-07-27 – 2012-07-28 (×2): 40 mg via ORAL
  Filled 2012-07-27 (×2): qty 4

## 2012-07-27 MED ORDER — CITALOPRAM HYDROBROMIDE 40 MG PO TABS: 40.0000 mg | ORAL_TABLET | Freq: Every day | ORAL | Status: DC

## 2012-07-27 MED ORDER — RIVAROXABAN 20 MG PO TABS: 20.0000 mg | ORAL_TABLET | Freq: Every day | ORAL | Status: DC

## 2012-07-27 MED ORDER — MUPIROCIN 2 % EX OINT
TOPICAL_OINTMENT | Freq: Two times a day (BID) | CUTANEOUS | Status: AC
  Administered 2012-07-27: 21:00:00 via NASAL

## 2012-07-27 MED ORDER — FENTANYL CITRATE 0.05 MG/ML IJ SOLN
INTRAMUSCULAR | Status: AC
  Filled 2012-07-27: qty 2

## 2012-07-27 MED ORDER — ALBUTEROL SULFATE (5 MG/ML) 0.5% IN NEBU: 2.5000 mg | INHALATION_SOLUTION | Freq: Four times a day (QID) | RESPIRATORY_TRACT | Status: DC | PRN

## 2012-07-27 MED ORDER — POTASSIUM CHLORIDE CRYS ER 10 MEQ PO TBCR
10.0000 meq | EXTENDED_RELEASE_TABLET | Freq: Two times a day (BID) | ORAL | Status: DC
  Administered 2012-07-27 – 2012-07-28 (×3): 10 meq via ORAL
  Filled 2012-07-27 (×3): qty 1

## 2012-07-27 MED ORDER — POLYETHYLENE GLYCOL 3350 17 G PO PACK
17.0000 g | PACK | Freq: Every day | ORAL | Status: DC
  Administered 2012-07-27 – 2012-07-28 (×2): 17 g via ORAL
  Filled 2012-07-27 (×2): qty 1

## 2012-07-27 MED ORDER — MIDAZOLAM HCL 2 MG/2ML IJ SOLN
1.0000 mg | INTRAMUSCULAR | Status: DC | PRN
  Administered 2012-07-27 (×2): 2 mg via INTRAVENOUS

## 2012-07-27 MED ORDER — BUPIVACAINE IN DEXTROSE 0.75-8.25 % IT SOLN
INTRATHECAL | Status: DC | PRN
  Administered 2012-07-27: 15 mg via INTRATHECAL

## 2012-07-27 MED ORDER — HYDROMORPHONE HCL PF 1 MG/ML IJ SOLN: 0.5000 mg | INTRAMUSCULAR | Status: DC | PRN

## 2012-07-27 SURGICAL SUPPLY — 22 items
BAG DRAIN URO TABLE W/ADPT NS (DRAPE) ×3 IMPLANT
BAG HAMPER (MISCELLANEOUS) ×3 IMPLANT
BAG URINE DRAINAGE (UROLOGICAL SUPPLIES) ×3 IMPLANT
CABLE HI FREQUENCY MONOPOLAR (ELECTROSURGICAL) ×3 IMPLANT
CLOTH BEACON ORANGE TIMEOUT ST (SAFETY) ×3 IMPLANT
ELECT CUT LOOP C-MAX 27FR .012 (CUTTING LOOP) ×3
ELECTRODE CUT LP CMX 27FR .012 (CUTTING LOOP) ×2 IMPLANT
FORMALIN 10 PREFIL 120ML (MISCELLANEOUS) ×3 IMPLANT
GLOVE BIO SURGEON STRL SZ7 (GLOVE) ×3 IMPLANT
GLOVE EXAM NITRILE MD LF STRL (GLOVE) ×3 IMPLANT
GLOVE INDICATOR 6.5 STRL GRN (GLOVE) ×3 IMPLANT
GLOVE INDICATOR 7.0 STRL GRN (GLOVE) ×3 IMPLANT
GLYCINE 1.5% IRRIG UROMATIC (IV SOLUTION) ×18 IMPLANT
GOWN STRL REIN XL XLG (GOWN DISPOSABLE) ×3 IMPLANT
KIT ROOM TURNOVER AP CYSTO (KITS) ×3 IMPLANT
MANIFOLD NEPTUNE II (INSTRUMENTS) ×3 IMPLANT
PACK CYSTO (CUSTOM PROCEDURE TRAY) ×3 IMPLANT
PAD ARMBOARD 7.5X6 YLW CONV (MISCELLANEOUS) ×3 IMPLANT
SET IRRIGATING DISP (SET/KITS/TRAYS/PACK) ×3 IMPLANT
SYRINGE IRR TOOMEY STRL 70CC (SYRINGE) ×3 IMPLANT
WATER STERILE IRR 1000ML POUR (IV SOLUTION) ×3 IMPLANT
YANKAUER SUCT BULB TIP 10FT TU (MISCELLANEOUS) ×3 IMPLANT

## 2012-07-27 NOTE — Transfer of Care (Signed)
Immediate Anesthesia Transfer of Care Note  Patient: Mitchell [name]  Procedure(s) Performed: Procedure(s): TRANSURETHRAL RESECTION OF BLADDER NECK CYSTOSCOPY WITH URETHRAL DILATATION FOREIGN BODY REMOVAL-CLIPS  Patient Location: PACU  Anesthesia Type:Spinal  Level of Consciousness: awake, alert  and oriented  Airway & Oxygen Therapy: Patient Spontanous Breathing and Patient connected to nasal cannula oxygen  Post-op Assessment: Report given to PACU RN  Post vital signs: Reviewed  Complications: No apparent anesthesia complications

## 2012-07-27 NOTE — Progress Notes (Signed)
No  Change in H&P on reexamination.

## 2012-07-27 NOTE — Brief Op Note (Signed)
07/27/2012  10:20 AM  PATIENT:  Mitchell [name]  67 y.o. male  PRE-OPERATIVE DIAGNOSIS:  bladder neck stenosis  POST-OPERATIVE DIAGNOSIS:  bladder neck stenosis  PROCEDURE:  Procedure(s): TRANSURETHRAL RESECTION OF BLADDER NECK CYSTOSCOPY WITH URETHRAL DILATATION FOREIGN BODY REMOVAL-CLIPS  SURGEON:  Surgeon(s) and Role:    * Ky Barban, MD - Primary  PHYSICIAN ASSISTANT:   ASSISTANTS: none   ANESTHESIA:   spinal  EBL:  Total I/O In: 600 [I.V.:600] Out: 100 [Urine:100]  BLOOD ADMINISTERED:none  DRAINS: Urinary Catheter (Foley)   LOCAL MEDICATIONS USED:  NONE  SPECIMEN:  Source of Specimen:  bladdeer nck chips metal clips from bladder neck given to family.bladder neck  DISPOSITION OF SPECIMEN:  PATHOLOGY  COUNTS:  YES  TOURNIQUET:  * No tourniquets in log *  DICTATION: .Other Dictation: Dictation Number dictation [id number]  PLAN OF CARE: Admit for overnight observation  PATIENT DISPOSITION:  PACU - hemodynamically stable.   Delay start of Pharmacological VTE agent (>24hrs) due to surgical blood loss or risk of bleeding:

## 2012-07-27 NOTE — H&P (Signed)
NAME:  Mitchell [name], Mitchell [name] NO.:  [id number]  MEDICAL RECORD NO.:  [id number]  LOCATION:                                 FACILITY:  PHYSICIAN:  Ky Barban, M.D.DATE OF BIRTH:  Jan 28, 1946  DATE OF ADMISSION:  07/27/2012 DATE OF DISCHARGE:  LH                             HISTORY & PHYSICAL   CHIEF COMPLAINT:  Difficulty voiding, bladder neck stenosis.  HISTORY:  A 67 year old gentleman who is well-known to me has bladder neck stenosis since he had radical retropubic prostatectomy for prostate cancer in 1999 at [facility].  I have done TUR bladder neck in the past, but he keeps having recurrent bladder neck stenosis.  We have managed him with dilation.  At this time, we could not dilate him.  With great difficulty, I was able to put a small catheter in his bladder.  He is being brought as an outpatient to undergo TUR bladder neck.  He is aware of the procedure, possible complications.  PAST MEDICAL HISTORY: 1. COPD. 2. Hypertension. 3. Pneumonia. 4. Prostate cancer. 5. History of kidney stones. 6. Chronic atrial fibrillation.  FAMILY HISTORY:  Father had prostate cancer.  PERSONAL HISTORY:  He is married.  Smokes half pack of cigarettes every day.  No alcohol use.  REVIEW OF SYSTEMS:  Denies any chest pain, orthopnea, PND, nausea, or vomiting.  PHYSICAL EXAMINATION:  GENERAL:  Moderately built male, not in acute distress, fully conscious, alert, oriented. CENTRAL NERVOUS SYSTEM:  No gross neurological deficits. HEAD, NECK, EYES, ENT:  Negative. CHEST:  Symmetrical.  Normal breath sounds. HEART:  Regular sinus rhythm.  No murmur. ABDOMEN:  Soft, flat.  Liver, spleen, kidneys not palpable. EXTERNAL GENITALIA:  He has Foley catheter in place.  Testicles unremarkable.  RECTAL:  Deferred. EXTREMITIES:  Normal.  IMPRESSION:  Recurrent bladder neck stenosis.  PLAN:  TUR bladder neck.  Keep him overnight in the hospital.  Procedure limitation and  complications were discussed in detail.     Ky Barban, M.D.     MIJ/MEDQ  D:  07/26/2012  T:  07/27/2012  Job:  [id number]

## 2012-07-27 NOTE — Anesthesia Preprocedure Evaluation (Signed)
Anesthesia Evaluation  Patient identified by MRN, date of birth, ID band Patient awake    Reviewed: Allergy & Precautions, H&P , NPO status , Patient's Chart, lab work & pertinent test results, reviewed documented beta blocker date and time   Airway Mallampati: III TM Distance: >3 FB Neck ROM: Full    Dental  (+) Edentulous Upper and Partial Lower   Pulmonary pneumonia -, COPD oxygen dependent, Current Smoker (>60 py hx),  breath sounds clear to auscultation        Cardiovascular hypertension, Pt. on medications + Past MI, +CHF, + Orthopnea and + DOE + dysrhythmias Atrial Fibrillation Rhythm:Irregular Rate:Tachycardia     Neuro/Psych PSYCHIATRIC DISORDERS Anxiety Depression    GI/Hepatic   Endo/Other    Renal/GU Renal disease     Musculoskeletal   Abdominal   Peds  Hematology  (+) Blood dyscrasia, ,   Anesthesia Other Findings   Reproductive/Obstetrics                           Anesthesia Physical Anesthesia Plan  ASA: III  Anesthesia Plan: Spinal   Post-op Pain Management:    Induction:   Airway Management Planned: Nasal Cannula  Additional Equipment:   Intra-op Plan:   Post-operative Plan:   Informed Consent: I have reviewed the patients History and Physical, chart, labs and discussed the procedure including the risks, benefits and alternatives for the proposed anesthesia with the patient or authorized representative who has indicated his/her understanding and acceptance.     Plan Discussed with:   Anesthesia Plan Comments:         Anesthesia Quick Evaluation

## 2012-07-27 NOTE — Progress Notes (Signed)
Room ready. Awake. Voices no c/o at this time.

## 2012-07-27 NOTE — Anesthesia Postprocedure Evaluation (Signed)
  Anesthesia Post-op Note  Patient: Mitchell [name]  Procedure(s) Performed: Procedure(s): TRANSURETHRAL RESECTION OF BLADDER NECK CYSTOSCOPY WITH URETHRAL DILATATION FOREIGN BODY REMOVAL-CLIPS  Patient Location: PACU  Anesthesia Type:Spinal  Level of Consciousness: awake, alert  and oriented  Airway and Oxygen Therapy: Patient Spontanous Breathing and Patient connected to nasal cannula oxygen  Post-op Pain: none  Post-op Assessment: Post-op Vital signs reviewed, Patient's Cardiovascular Status Stable, Respiratory Function Stable, Patent Airway and No signs of Nausea or vomiting  Post-op Vital Signs: Reviewed, 89/48, ephedrine 10mg  iv  Complications: No apparent anesthesia complications

## 2012-07-27 NOTE — Anesthesia Procedure Notes (Signed)
Spinal  Patient location during procedure: OR Start time: 07/27/2012 9:21 AM Staffing CRNA/Resident: Glynn Octave E Preanesthetic Checklist Completed: patient identified, site marked, surgical consent, pre-op evaluation, timeout performed, IV checked, risks and benefits discussed and monitors and equipment checked Spinal Block Patient position: right lateral decubitus Prep: Betadine Patient monitoring: heart rate, cardiac monitor, continuous pulse ox and blood pressure Approach: right paramedian Location: L3-4 Injection technique: single-shot Needle Needle type: Spinocan  Needle gauge: 22 G Needle length: 9 cm Assessment Sensory level: T8 Additional Notes ATTEMPTS:1 TRAY ZO:[id number] TRAY EXPIRATION DATE:03/2013

## 2012-07-27 NOTE — Progress Notes (Signed)
Dr. Rudean Haskell office called and requested pharmacy to continue all medications on PTA medication report.

## 2012-07-28 MED ORDER — ONDANSETRON HCL 4 MG/2ML IJ SOLN
4.0000 mg | Freq: Once | INTRAMUSCULAR | Status: AC
  Administered 2012-07-28: 4 mg via INTRAVENOUS
  Filled 2012-07-28: qty 2

## 2012-07-28 MED ORDER — HYDROMORPHONE HCL PF 1 MG/ML IJ SOLN
0.5000 mg | INTRAMUSCULAR | Status: DC | PRN
  Administered 2012-07-28 (×2): 0.5 mg via INTRAVENOUS
  Filled 2012-07-28 (×2): qty 1

## 2012-07-28 MED ORDER — ALPRAZOLAM 0.25 MG PO TABS
0.2500 mg | ORAL_TABLET | Freq: Once | ORAL | Status: AC
  Administered 2012-07-28: 0.25 mg via ORAL
  Filled 2012-07-28: qty 1

## 2012-07-28 NOTE — Op Note (Signed)
NAME:  THEDFORD, BUNTON NO.:  [id number]  MEDICAL RECORD NO.:  [id number]  LOCATION:  A327                          FACILITY:  [facility]  PHYSICIAN:  Ky Barban, M.D.DATE OF BIRTH:  [birth year]  DATE OF PROCEDURE: DATE OF DISCHARGE:                              OPERATIVE REPORT   PREOPERATIVE DIAGNOSIS:  Prostate cancer, bladder neck stenosis.  PROCEDURE:  TUR of bladder neck and removal of clips from the bladder neck.  PROCEDURE:  The patient under spinal anesthesia in lithotomy position. After usual prep and drape, a #28 Iglesias resectoscope was introduced to the level of the bladder neck, which was resected at 6 o'clock position.  The resectoscope was advanced into the bladder which was inspected, looks normal.  More dissection circumferentially at the bladder neck was done between 6 and 7, 5 o'clock positions.  There are more clips which were removed from the bladder neck.  Once the clips were removed, the bladder neck was smooth and wide open.  All the chips were evacuated.  Bleeders were coagulated.  Resectoscope was removed.  A #20 Foley catheter left in for drainage.  The patient left the operating room in satisfactory condition.     Ky Barban, M.D.     MIJ/MEDQ  D:  07/27/2012  T:  07/27/2012  Job:  [id number]

## 2012-07-28 NOTE — Clinical Social Work Psychosocial (Signed)
Clinical Social Work Department BRIEF PSYCHOSOCIAL ASSESSMENT 07/28/2012  Patient:  Mitchell [name], Mitchell [name]     Account Number:  [id number]     Admit date:  07/27/2012  Clinical Social Worker:  Nancie Neas  Date/Time:  07/28/2012 10:15 AM  Referred by:  CSW  Date Referred:  07/28/2012 Referred for  SNF Placement   Other Referral:   Interview type:  Patient Other interview type:   wife- Mitchell [name]    PSYCHOSOCIAL DATA Living Status:  FACILITY Admitted from facility:  [facility] Level of care:  Skilled Nursing Facility Primary support name:  Mitchell [name] Primary support relationship to patient:  SPOUSE Degree of support available:   supportive    CURRENT CONCERNS Current Concerns  Post-Acute Placement   Other Concerns:   DSS involvement    SOCIAL WORK ASSESSMENT / PLAN CSW met with pt at bedside. Pt alert, but somewhat confused and requests CSW to speak with his wife, Mitchell [name]. Mitchell [name] reports pt was placed at [facility] about 4 weeks ago from [facility]. She indicates that they have been trying to get him home but was waiting on DSS. Mitchell [name] provided contact information for [employer], casework at [employer]. Per Shawna Orleans, pt was getting out of the house without Mitchell [name] knowing and was found lying in the snow before he went to [place]. They petitioned for interim guardianship which they still have. A hearing was 2 weeks ago and pt had cleared as he had been taking his medications properly at facility. They agreed for pt to return home on a trial basis once a new CAP aid was set up as pt's wife had been his CAP aid before. Shawna Orleans indicates she talked with someone with CAP program this morning. CSW updated pt's wife who understands pt will need to return to [facility] until this process is complete. Okay for return per Tresa Endo at facility. MD plans to d/c pt today and facility will provide transportation this afternoon. CSW to fax D/C summary when complete. No FL2 as <24 hour  observation.   Assessment/plan status:  Referral to [facility] Other assessment/ plan:   Information/referral to community resources:   [employer]  DSS    PATIENT'S/FAMILY'S RESPONSE TO PLAN OF CARE: Pt and pt's wife both desire for pt to return home but understand that CAP must be arranged before this can happen. Facility updated. No other needs.       Derenda Fennel, [place] [phone]

## 2012-07-28 NOTE — Progress Notes (Signed)
Notified Dr. Jerre Simon and charge RN of pt expressing suicidal thoughts due to having to go back to [facility]. Pt said this twice while speaking with SW, Derenda Fennel. I spoke with pt who denies having any suicidal or homicidal thoughts or plans. Pt states that he only said that because it made him feel better for a moment, and that he would do anything to not go back to [facility], but he is not that desperate now. Order received for ACT team consult. Family at pts bedside.  Sheryn Bison

## 2012-07-28 NOTE — Progress Notes (Signed)
UR Chart Review Completed  

## 2012-07-28 NOTE — Progress Notes (Signed)
Report called to [place] at [facility]. No questions at this time. Sheryn Bison

## 2012-07-28 NOTE — Progress Notes (Signed)
Report [id number]

## 2012-07-28 NOTE — Discharge Summary (Signed)
[id number]

## 2012-07-28 NOTE — Discharge Summary (Signed)
NAME:  NAHSIR, VENEZIA NO.:  [id number]  MEDICAL RECORD NO.:  [id number]  LOCATION:  A327                          FACILITY:  [facility]  PHYSICIAN:  Ky Barban, M.D.DATE OF BIRTH:  10-Aug-1945  DATE OF ADMISSION:  07/27/2012 DATE OF DISCHARGE:  LH                              DISCHARGE SUMMARY   Mr. Mitchell [name] is a 67 year old gentleman, who is known to have prostate cancer.  He has undergone several years ago radical retropubic prostatectomy in [facility].  As far as cancer is concerned, I do not see any recurrence, but he has developed bladder neck stenosis, that several times I had to go in, resected the bladder neck.  There is no recurrence in the bladder neck, but he has staples on the bladder neck, which keeps causing him having difficulty to dilate his bladder neck, so I brought him this time again after doing a routine admission workup, which is fine.  CBC, BMET is normal.  He was taken to the operating room yesterday.  I did resect the bladder neck.  More staples from the bladder neck were removed.  I do not see any more staples in that area. The bladder neck is open.  I am going to send him home with a Foley catheter.  He is going to go to nursing home with a Foley catheter.  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  His final pathology report is still pending.  I will see him back in 2 weeks in the office.     Ky Barban, M.D.     MIJ/MEDQ  D:  07/28/2012  T:  07/28/2012  Job:  [id number]

## 2012-07-28 NOTE — Clinical Social Work Note (Signed)
Pt evaluated by ACT team and okay for return to SNF. CSW left voicemail for Melanie at DSS to explain situation. Awaiting return call. CSW arranged [facility] appointment for Friday morning. [facility] aware. Facility will pick up pt this afternoon. RN to fax D/C summary this afternoon to facility.  Derenda Fennel, [place] [phone]

## 2012-07-28 NOTE — BH Assessment (Signed)
Assessment Note   Mitchell [name] is an 67 y.o. male. The patient was admitted fr Kidney issues. He is now ready for discharge. His plan is to go home, but RCDSS(Mitchell [name]) has not completed everything needed for a CAP person to be in the home. Until this is completed, the patient must reside at the nursing facility. This morning when he was told of the discharge plan he said twice that he would kill himself. He has withdrawn those  statements and now denies he will harm himself. He is not homicidal nor does he have a history of violence. He is now psychotic.Patient continues to talk about how bad the facility is and they ignore many of his problems. The patient is adamant that he will not harm himself. He also will be in a safe environment.  Axis I: Mood Disorder NOS Axis II: Deferred Axis III:  Past Medical History  Diagnosis Date  . COPD (chronic obstructive pulmonary disease)   . Hypertension   . Pneumonia   . Cancer   . Prostate cancer   . Systolic heart failure   . Leukemia   . Leukemia   . Kidney stones   . AF (atrial fibrillation) 08/21/2011  . Anxiety    Axis IV: housing problems, other psychosocial or environmental problems and problems related to social environment Axis V: 41-50 serious symptoms  Past Medical History:  Past Medical History  Diagnosis Date  . COPD (chronic obstructive pulmonary disease)   . Hypertension   . Pneumonia   . Cancer   . Prostate cancer   . Systolic heart failure   . Leukemia   . Leukemia   . Kidney stones   . AF (atrial fibrillation) 08/21/2011  . Anxiety     Past Surgical History  Procedure Laterality Date  . Colon surgery    . Prostate surgery  1999  . Transurethral resection of bladder tumor  08/25/2011    Procedure: TRANSURETHRAL RESECTION OF BLADDER TUMOR (TURBT);  Surgeon: Ky Barban, MD;  Location: [facility];  Service: Urology;  Laterality: N/A;  Transurethral Resection of Bladder Neck  . Cystoscopy with urethral  dilatation  12/30/2011    Procedure: CYSTOSCOPY WITH URETHRAL DILATATION;  Surgeon: Ky Barban, MD;  Location: [facility];  Service: Urology;;  . [place]  12/30/2011    Procedure: CYSTOSCOPY/URETHROTOMY;  Surgeon: Ky Barban, MD;  Location: [facility];  Service: Urology;;  . Foreign body removal  12/30/2011    Procedure: FOREIGN BODY REMOVAL;  Surgeon: Ky Barban, MD;  Location: [facility];  Service: Urology;;  Transurethral Removal of 4 clips from bladder neck  . Partial colectomy  2012    [facility]    Family History:  Family History  Problem Relation Age of Onset  . Heart attack Mother   . Cancer Father   . Stroke Father     Social History:  reports that he has quit smoking. His smoking use included Cigarettes. He has a 21 pack-year smoking history. He quit smokeless tobacco use about 4 weeks ago. He reports that he does not drink alcohol or use illicit drugs.  Additional Social History:     CIWA: CIWA-Ar BP: 130/77 mmHg Pulse Rate: 70 COWS:    Allergies: No Known Allergies  Home Medications:  Medications Prior to Admission  Medication Sig Dispense Refill  . albuterol (PROVENTIL HFA;VENTOLIN HFA) 108 (90 BASE) MCG/ACT inhaler Inhale 2 puffs into the lungs every 6 (six) hours as needed. For shortness  of breath       . budesonide-formoterol (SYMBICORT) 160-4.5 MCG/ACT inhaler Inhale 2 puffs into the lungs 2 (two) times daily.      . citalopram (CELEXA) 40 MG tablet Take 40 mg by mouth daily.        . digoxin (LANOXIN) 0.25 MG tablet Take 1 tablet (0.25 mg total) by mouth daily.  30 tablet  0  . diltiazem (CARDIZEM CD) 180 MG 24 hr capsule Take 180 mg by mouth daily.      Marland Kitchen docusate sodium (COLACE) 100 MG capsule Take 100 mg by mouth as needed. For constipation       . Fluticasone-Salmeterol (ADVAIR DISKUS) 250-50 MCG/DOSE AEPB Inhale 1 puff into the lungs every 12 (twelve) hours.        . furosemide (LASIX) 40 MG tablet Take 1 tablet (40 mg total) by mouth 2 (two) times  daily.  60 tablet  0  . lisinopril (PRINIVIL,ZESTRIL) 40 MG tablet Take 40 mg by mouth daily.      . metoprolol succinate (TOPROL-XL) 50 MG 24 hr tablet Take 50 mg by mouth daily. Take with or immediately following a meal.      . mometasone (ELOCON) 0.1 % cream Apply 1 application topically daily as needed. Skin lesions       . oxyCODONE-acetaminophen (PERCOCET/ROXICET) 5-325 MG per tablet Take 1 tablet by mouth 3 (three) times daily as needed for pain.       . polyethylene glycol powder (GLYCOLAX/MIRALAX) powder Take 17 g by mouth daily.      . potassium chloride (KLOR-CON) 10 MEQ CR tablet Take 4 tablets (40 mEq total) by mouth 2 (two) times daily.  240 tablet  0  . venlafaxine XR (EFFEXOR-XR) 75 MG 24 hr capsule Take 75 mg by mouth daily.      Marland Kitchen ALPRAZolam (XANAX) 1 MG tablet Take 1 mg by mouth at bedtime as needed. For sleep      . aspirin EC 81 MG tablet Take 81 mg by mouth daily.      Marland Kitchen ipratropium-albuterol (DUONEB) 0.5-2.5 (3) MG/3ML SOLN Take 3 mLs by nebulization every 6 (six) hours as needed.      . Rivaroxaban (XARELTO) 20 MG TABS Take 1 tablet by mouth daily.        OB/GYN Status:  No LMP for male patient.  General Assessment Data Location of Assessment: [facility] ED (UNIT 300/ Room 327) ACT Assessment: Yes Living Arrangements: Other (Comment) (SNF-to return home as soon as CAP can be arranged) Can pt return to current living arrangement?: Yes Admission Status: Voluntary Is patient capable of signing voluntary admission?: Yes Transfer from: Acute Hospital Referral Source: Medical Floor Inpatient  Education Status Is patient currently in school?: No  Risk to self Suicidal Ideation: No-Not Currently/Within Last 6 Months (said twice this am that he would kill himself if he had to g) Suicidal Intent: No Is patient at risk for suicide?: No Suicidal Plan?: No Access to Means: No What has been your use of drugs/alcohol within the last 12 months?: none Previous Attempts/Gestures:  No How many times?: 0 Other Self Harm Risks: no Triggers for Past Attempts: None known Intentional Self Injurious Behavior: None Family Suicide History: No Recent stressful life event(s): Recent negative physical changes;Other (Comment) (living in SNF for rehabilition- will go honme soon) Persecutory voices/beliefs?: No Depression: Yes Depression Symptoms: Insomnia;Isolating;Loss of interest in usual pleasures;Feeling angry/irritable Substance abuse history and/or treatment for substance abuse?: No Suicide prevention information given to non-admitted  patients: Yes  Risk to Others Homicidal Ideation: No Thoughts of Harm to Others: No Current Homicidal Intent: No Current Homicidal Plan: No Access to Homicidal Means: No History of harm to others?: No Does patient have access to weapons?: No Criminal Charges Pending?: No Does patient have a court date: No  Psychosis Hallucinations: None noted Delusions: None noted  Mental Status Report Appear/Hygiene: Improved Eye Contact: Good Motor Activity: Freedom of movement;Restlessness Speech: Argumentative;Logical/coherent Level of Consciousness: Alert;Restless;Irritable Mood: Depressed;Suspicious;Helpless;Irritable Affect: Angry;Depressed;Irritable Anxiety Level: Minimal Thought Processes: Coherent;Relevant Judgement: Impaired Orientation: Person;Place;Time Obsessive Compulsive Thoughts/Behaviors: Moderate  Cognitive Functioning Concentration: Decreased Memory: Recent Intact;Remote Intact IQ: Average Insight: Fair Impulse Control: Fair Appetite: Fair Weight Loss: 15 Sleep: No Change Vegetative Symptoms: None  ADLScreening [facility] Assessment Services) Patient's cognitive ability adequate to safely complete daily activities?: Yes Patient able to express need for assistance with ADLs?: Yes Independently performs ADLs?: No  Abuse/Neglect [facility]) Physical Abuse: Denies Verbal Abuse: Denies Sexual Abuse: Denies  Prior  Inpatient Therapy Prior Inpatient Therapy: No  Prior Outpatient Therapy Prior Outpatient Therapy: No  ADL Screening (condition at time of admission) Patient's cognitive ability adequate to safely complete daily activities?: Yes Patient able to express need for assistance with ADLs?: Yes Independently performs ADLs?: No Communication: Independent Dressing (OT): Needs assistance Is this a change from baseline?: Pre-admission baseline Grooming: Needs assistance Is this a change from baseline?: Pre-admission baseline Feeding: Independent Bathing: Needs assistance Is this a change from baseline?: Pre-admission baseline Toileting: Needs assistance Is this a change from baseline?: Pre-admission baseline In/Out Bed: Needs assistance Is this a change from baseline?: Pre-admission baseline Walks in Home: Needs assistance Is this a change from baseline?: Pre-admission baseline Weakness of Legs: Both Weakness of Arms/Hands: Both  Home Assistive Devices/Equipment Home Assistive Devices/Equipment: Wheelchair  Therapy Consults (therapy consults require a physician order) PT Evaluation Needed: Yes (Comment) OT Evalulation Needed: Yes (Comment) SLP Evaluation Needed: No Abuse/Neglect Assessment (Assessment to be complete while patient is alone) Physical Abuse: Denies Verbal Abuse: Denies Sexual Abuse: Denies Exploitation of patient/patient's resources: Denies Self-Neglect: Denies Values / Beliefs Cultural Requests During Hospitalization: None Spiritual Requests During Hospitalization: None Consults Spiritual Care Consult Needed: No Social Work Consult Needed: No Merchant navy officer (For Healthcare) Advance Directive: Patient does not have advance directive Pre-existing out of facility DNR order (yellow form or pink MOST form): No Nutrition Screen- [facility] Adult/[facility] Patient's home diet: Regular Have you recently lost weight without trying?: No Have you been eating poorly because of a  decreased appetite?: No Malnutrition Screening Tool Score: 0  Additional Information 1:1 In Past 12 Months?: No CIRT Risk: No Elopement Risk: No Does patient have medical clearance?: Yes     Disposition: PATIENT CONTRACTS FOR SAFETY. ARRANGEMENTS WILL BE COMPLETED BY THE CLINICAL SOCIAL WORKER TO RETURN TO THE NURSING FACILITY AND TO BE SURE THAT PSYCHIATRIC FOLLOW UP WILL BE ARRANGED THROUGH THE NURSING FACILITY. SOCIAL WITH THE CAP PERSON.WORKER SHOULD ALSO CONTACT RCDSS TO ENSURE THEY ARE PROCEDING  Disposition Initial Assessment Completed for this Encounter: Yes Disposition of Patient: Other dispositions Other disposition(s): Other (Comment) (referred back to psychiatric servicwes at [facility])  On Site Evaluation by:   Reviewed with Physician:     Jearld Pies 07/28/2012 2:02 PM

## 2012-07-28 NOTE — Clinical Social Work Note (Signed)
While discussing return to [facility] today, pt said twice that he would kill himself and said he was serious. His wife tried to calm him down and told him that it would be short term. RN notified MD who ordered ACT team consult. CSW will follow up after this is complete.  Derenda Fennel, [place] [phone]

## 2012-07-28 NOTE — Anesthesia Postprocedure Evaluation (Signed)
  Anesthesia Post-op Note  Patient: Mitchell [name]  Procedure(s) Performed: Procedure(s): TRANSURETHRAL RESECTION OF BLADDER NECK CYSTOSCOPY WITH URETHRAL DILATATION FOREIGN BODY REMOVAL-CLIPS BLADDER NECK  Patient Location: Room 327  Anesthesia Type:Spinal  Level of Consciousness: awake, alert , oriented and patient cooperative  Airway and Oxygen Therapy: Patient Spontanous Breathing  Post-op Pain: mild  Post-op Assessment: Post-op Vital signs reviewed, Patient's Cardiovascular Status Stable, Respiratory Function Stable, Patent Airway, No signs of Nausea or vomiting, Adequate PO intake and Pain level controlled  Post-op Vital Signs: Reviewed and stable  Complications: No apparent anesthesia complications

## 2012-07-28 NOTE — Care Management Note (Signed)
    Page 1 of 1   07/28/2012     1:23:24 PM   CARE MANAGEMENT NOTE 07/28/2012  Patient:  Mitchell [name], Mitchell [name]   Account Number:  [id number]  Date Initiated:  07/28/2012  Documentation initiated by:  Sharrie Rothman  Subjective/Objective Assessment:   Pt admitted from [facility] for TURP. Pt will return at discharge. Pts guardian is Hospital doctor at [facility].     Action/Plan:   CSW to arrange discharge to facility.   Anticipated DC Date:  07/28/2012   Anticipated DC Plan:  SKILLED NURSING FACILITY  In-house referral  Clinical Social Worker      DC Planning Services  CM consult      Choice offered to / List presented to:             Status of service:  Completed, signed off Medicare Important Message given?   (If response is "NO", the following Medicare IM given date fields will be blank) Date Medicare IM given:   Date Additional Medicare IM given:    Discharge Disposition:  SKILLED NURSING FACILITY  Per UR Regulation:    If discussed at Long Length of Stay Meetings, dates discussed:    Comments:  07/28/12 1322 Arlyss Queen, RN BSN CM

## 2012-07-29 ENCOUNTER — Encounter (HOSPITAL_COMMUNITY): Payer: Self-pay | Admitting: Urology

## 2012-07-29 NOTE — Progress Notes (Signed)
NAME:  Mitchell [name], Mitchell [name] NO.:  [id number]  MEDICAL RECORD NO.:  [id number]  LOCATION:  A327                          FACILITY:  [facility]  PHYSICIAN:  Ky Barban, M.D.DATE OF BIRTH:  [birth year]  DATE OF PROCEDURE: DATE OF DISCHARGE:  07/28/2012                                PROGRESS NOTE   [name] had radical retropubic prostatectomy for prostate cancer several years ago in [facility].  He has developed bladder neck stenosis several times, I had to bring him and do resection of his bladder neck.  There is no recurrence of the cancer in that area, but he has staples in the bladder neck, which keep causing difficulty with Foley catheter insertion, so resected his bladder neck again yesterday and his 1st postop day he is doing fine.  His urine is clear, so I am going to send him to the nursing home with Foley catheter.  I will see him in the office in 2 weeks.  At that time, I will decide what to do with the Foley catheter.  Report to the office in 2 weeks.  I told him to continue his usual medicines and if he has fever over 101 to let me know.  His CBC and BMET is normal today.  He is afebrile.  Abdomen is soft, not distended.     Ky Barban, M.D.     MIJ/MEDQ  D:  07/28/2012  T:  07/29/2012  Job:  [id number]

## 2012-09-23 ENCOUNTER — Encounter (HOSPITAL_COMMUNITY): Payer: Self-pay | Admitting: Pharmacy Technician

## 2012-09-29 ENCOUNTER — Other Ambulatory Visit: Payer: Self-pay

## 2012-09-29 ENCOUNTER — Encounter (HOSPITAL_COMMUNITY)
Admission: RE | Admit: 2012-09-29 | Discharge: 2012-09-29 | Disposition: A | Discharge status: Home / Self Care | Payer: Medicare Other | Source: Ambulatory Visit | Attending: Ophthalmology | Admitting: Ophthalmology

## 2012-09-29 ENCOUNTER — Encounter (HOSPITAL_COMMUNITY): Payer: Self-pay

## 2012-09-29 HISTORY — DX: Personal history of urinary calculi: Z87.442

## 2012-09-29 LAB — BASIC METABOLIC PANEL
BUN: 13 mg/dL (ref 6–23)
CO2: 26 mEq/L (ref 19–32)
Calcium: 9.2 mg/dL (ref 8.4–10.5)
Chloride: 103 mEq/L (ref 96–112)
Creatinine, Ser: 1.05 mg/dL (ref 0.50–1.35)
GFR calc Af Amer: 83 mL/min — ABNORMAL LOW (ref >90)
GFR calc non Af Amer: 71 mL/min — ABNORMAL LOW (ref >90)
Glucose, Bld: 127 mg/dL — ABNORMAL HIGH (ref 70–99)
Potassium: 3.4 mEq/L — ABNORMAL LOW (ref 3.5–5.1)
Sodium: 140 mEq/L (ref 135–145)

## 2012-09-29 LAB — HEMOGLOBIN AND HEMATOCRIT, BLOOD: HCT: 38.4 % — ABNORMAL LOW (ref 39.0–52.0)

## 2012-09-29 NOTE — Patient Instructions (Signed)
Mitchell [name]  09/29/2012   Your procedure is scheduled on:  10/04/12  Report to [facility] at 0900 AM.  Call this number if you have problems the morning of surgery: [phone]   Remember:   Do not eat food or drink liquids after midnight.   Take these medicines the morning of surgery with A SIP OF WATER: cardizem, duoneb, lisinopril, effexor, xanax, albuterol, advair, digoxin, metoprolol   Do not wear jewelry, make-up or nail polish.  Do not wear lotions, powders, or perfumes. You may wear deodorant.  Do not shave 48 hours prior to surgery. Men may shave face and neck.  Do not bring valuables to the hospital.  [facility] is not responsible                   for any belongings or valuables.  Contacts, dentures or bridgework may not be worn into surgery.  Leave suitcase in the car. After surgery it may be brought to your room.  For patients admitted to the hospital, checkout time is 11:00 AM the day of  discharge.   Patients discharged the day of surgery will not be allowed to drive  home.  Name and phone number of your driver: family  Special Instructions: N/A   Please read over the following fact sheets that you were given: Anesthesia Post-op Instructions and Care and Recovery After Surgery   PATIENT INSTRUCTIONS POST-ANESTHESIA  IMMEDIATELY FOLLOWING SURGERY:  Do not drive or operate machinery for the first twenty four hours after surgery.  Do not make any important decisions for twenty four hours after surgery or while taking narcotic pain medications or sedatives.  If you develop intractable nausea and vomiting or a severe headache please notify your doctor immediately.  FOLLOW-UP:  Please make an appointment with your surgeon as instructed. You do not need to follow up with anesthesia unless specifically instructed to do so.  WOUND CARE INSTRUCTIONS (if applicable):  Keep a dry clean dressing on the anesthesia/puncture wound site if there is drainage.  Once the wound has quit  draining you may leave it open to air.  Generally you should leave the bandage intact for twenty four hours unless there is drainage.  If the epidural site drains for more than 36-48 hours please call the anesthesia department.  QUESTIONS?:  Please feel free to call your physician or the hospital operator if you have any questions, and they will be happy to assist you.      Cataract Surgery  A cataract is a clouding of the lens of the eye. When a lens becomes cloudy, vision is reduced based on the degree and nature of the clouding. Surgery may be needed to improve vision. Surgery removes the cloudy lens and usually replaces it with a substitute lens (intraocular lens, IOL). LET YOUR EYE DOCTOR KNOW ABOUT:  Allergies to food or medicine.  Medicines taken including herbs, eyedrops, over-the-counter medicines, and creams.  Use of steroids (by mouth or creams).  Previous problems with anesthetics or numbing medicine.  History of bleeding problems or blood clots.  Previous surgery.  Other health problems, including diabetes and kidney problems.  Possibility of pregnancy, if this applies. RISKS AND COMPLICATIONS  Infection.  Inflammation of the eyeball (endophthalmitis) that can spread to both eyes (sympathetic ophthalmia).  Poor wound healing.  If an IOL is inserted, it can later fall out of proper position. This is very uncommon.  Clouding of the part of your eye that holds an  IOL in place. This is called an "after-cataract." These are uncommon, but easily treated. BEFORE THE PROCEDURE  Do not eat or drink anything except small amounts of water for 8 to 12 before your surgery, or as directed by your caregiver.  Unless you are told otherwise, continue any eyedrops you have been prescribed.  Talk to your primary caregiver about all other medicines that you take (both prescription and non-prescription). In some cases, you may need to stop or change medicines near the time of your  surgery. This is most important if you are taking blood-thinning medicine.Do not stop medicines unless you are told to do so.  Arrange for someone to drive you to and from the procedure.  Do not put contact lenses in either eye on the day of your surgery. PROCEDURE There is more than one method for safely removing a cataract. Your doctor can explain the differences and help determine which is best for you. Phacoemulsification surgery is the most common form of cataract surgery.  An injection is given behind the eye or eyedrops are given to make this a painless procedure.  A small cut (incision) is made on the edge of the clear, dome-shaped surface that covers the front of the eye (cornea).  A tiny probe is painlessly inserted into the eye. This device gives off ultrasound waves that soften and break up the cloudy center of the lens. This makes it easier for the cloudy lens to be removed by suction.  An IOL may be implanted.  The normal lens of the eye is covered by a clear capsule. Part of that capsule is intentionally left in the eye to support the IOL.  Your surgeon may or may not use stitches to close the incision. There are other forms of cataract surgery that require a larger incision and stiches to close the eye. This approach is taken in cases where the doctor feels that the cataract cannot be easily removed using phacoemulsification. AFTER THE PROCEDURE  When an IOL is implanted, it does not need care. It becomes a permanent part of your eye and cannot be seen or felt.  Your doctor will schedule follow-up exams to check on your progress.  Review your other medicines with your doctor to see which can be resumed after surgery.  Use eyedrops or take medicine as prescribed by your doctor. Document Released: 04/03/2011 Document Revised: 07/07/2011 Document Reviewed: 04/03/2011 [facility] Patient Information 2014 [place], [place].

## 2012-09-30 NOTE — Progress Notes (Signed)
Mr. Mcmiller came in for preadmisision testing for cataract surgery. Labs are as follows. Vidal Schwalbe, RN  Results for RITTER, HELSLEY (MRN [id number]) as of 09/30/2012 10:17  Ref. Range 09/29/2012 14:00  Sodium Latest Range: 135-145 mEq/L 140  Potassium Latest Range: 3.5-5.1 mEq/L 3.4 (L)  Chloride Latest Range: 96-112 mEq/L 103  CO2 Latest Range: 19-32 mEq/L 26  BUN Latest Range: 6-23 mg/dL 13  Creatinine Latest Range: 0.50-1.35 mg/dL 4.09  Calcium Latest Range: 8.4-10.5 mg/dL 9.2  GFR calc non Af Amer Latest Range: >90 mL/min 71 (L)  GFR calc Af Amer Latest Range: >90 mL/min 83 (L)  Glucose Latest Range: 70-99 mg/dL 811 (H)  Hemoglobin Latest Range: 13.0-17.0 g/dL 91.4 (L)  HCT Latest Range: 39.0-52.0 % 38.4 (L)  MRSA, PCR Latest Range: NEGATIVE  NEGATIVE  Staphylococcus aureus Latest Range: NEGATIVE  POSITIVE (A)

## 2012-10-01 MED ORDER — TETRACAINE HCL 0.5 % OP SOLN
OPHTHALMIC | Status: AC
  Filled 2012-10-01: qty 2

## 2012-10-01 MED ORDER — CYCLOPENTOLATE-PHENYLEPHRINE 0.2-1 % OP SOLN
OPHTHALMIC | Status: AC
  Filled 2012-10-01: qty 2

## 2012-10-01 MED ORDER — LIDOCAINE HCL 3.5 % OP GEL
OPHTHALMIC | Status: AC
  Filled 2012-10-01: qty 5

## 2012-10-01 MED ORDER — NEOMYCIN-POLYMYXIN-DEXAMETH 3.5-10000-0.1 OP OINT
TOPICAL_OINTMENT | OPHTHALMIC | Status: AC
  Filled 2012-10-01: qty 3.5

## 2012-10-01 MED ORDER — LIDOCAINE HCL (PF) 1 % IJ SOLN
INTRAMUSCULAR | Status: AC
  Filled 2012-10-01: qty 2

## 2012-10-04 ENCOUNTER — Encounter (HOSPITAL_COMMUNITY)
Admission: RE | Disposition: A | Discharge status: Home / Self Care | Payer: Self-pay | Source: Ambulatory Visit | Attending: Ophthalmology

## 2012-10-04 ENCOUNTER — Ambulatory Visit (HOSPITAL_COMMUNITY)
Admission: RE | Admit: 2012-10-04 | Discharge: 2012-10-04 | Disposition: A | Discharge status: Home / Self Care | Payer: Medicare Other | Source: Ambulatory Visit | Attending: Ophthalmology | Admitting: Ophthalmology

## 2012-10-04 ENCOUNTER — Encounter (HOSPITAL_COMMUNITY): Payer: Self-pay | Admitting: *Deleted

## 2012-10-04 ENCOUNTER — Ambulatory Visit (HOSPITAL_COMMUNITY): Payer: Medicare Other | Admitting: Anesthesiology

## 2012-10-04 ENCOUNTER — Encounter (HOSPITAL_COMMUNITY): Payer: Self-pay | Admitting: Anesthesiology

## 2012-10-04 DIAGNOSIS — I10 Essential (primary) hypertension: Secondary | ICD-10-CM | POA: Insufficient documentation

## 2012-10-04 DIAGNOSIS — Z01812 Encounter for preprocedural laboratory examination: Secondary | ICD-10-CM | POA: Insufficient documentation

## 2012-10-04 DIAGNOSIS — Z79899 Other long term (current) drug therapy: Secondary | ICD-10-CM | POA: Insufficient documentation

## 2012-10-04 DIAGNOSIS — J449 Chronic obstructive pulmonary disease, unspecified: Secondary | ICD-10-CM | POA: Insufficient documentation

## 2012-10-04 DIAGNOSIS — Z01810 Encounter for preprocedural cardiovascular examination: Secondary | ICD-10-CM | POA: Insufficient documentation

## 2012-10-04 DIAGNOSIS — H2589 Other age-related cataract: Secondary | ICD-10-CM | POA: Insufficient documentation

## 2012-10-04 DIAGNOSIS — J4489 Other specified chronic obstructive pulmonary disease: Secondary | ICD-10-CM | POA: Insufficient documentation

## 2012-10-04 HISTORY — PX: CATARACT EXTRACTION W/PHACO: SHX586

## 2012-10-04 SURGERY — PHACOEMULSIFICATION, CATARACT, WITH IOL INSERTION
Anesthesia: Monitor Anesthesia Care | Site: Eye | Laterality: Right | Wound class: Clean

## 2012-10-04 MED ORDER — EPINEPHRINE HCL 1 MG/ML IJ SOLN
INTRAOCULAR | Status: DC | PRN
  Administered 2012-10-04: 11:00:00

## 2012-10-04 MED ORDER — LIDOCAINE 3.5 % OP GEL OPTIME - NO CHARGE
OPHTHALMIC | Status: DC | PRN
  Administered 2012-10-04: 1 [drp] via OPHTHALMIC

## 2012-10-04 MED ORDER — NEOMYCIN-POLYMYXIN-DEXAMETH 0.1 % OP OINT
TOPICAL_OINTMENT | OPHTHALMIC | Status: DC | PRN
  Administered 2012-10-04: 1 via OPHTHALMIC

## 2012-10-04 MED ORDER — LIDOCAINE HCL (PF) 1 % IJ SOLN: INTRAOCULAR | Status: DC | PRN

## 2012-10-04 MED ORDER — CYCLOPENTOLATE-PHENYLEPHRINE 0.2-1 % OP SOLN
1.0000 [drp] | OPHTHALMIC | Status: AC
  Administered 2012-10-04 (×3): 1 [drp] via OPHTHALMIC

## 2012-10-04 MED ORDER — MIDAZOLAM HCL 2 MG/2ML IJ SOLN
INTRAMUSCULAR | Status: AC
  Filled 2012-10-04: qty 2

## 2012-10-04 MED ORDER — TETRACAINE HCL 0.5 % OP SOLN
1.0000 [drp] | OPHTHALMIC | Status: AC
  Administered 2012-10-04 (×3): 1 [drp] via OPHTHALMIC

## 2012-10-04 MED ORDER — LIDOCAINE HCL 3.5 % OP GEL
1.0000 "application " | Freq: Once | OPHTHALMIC | Status: AC
  Administered 2012-10-04: 1 via OPHTHALMIC

## 2012-10-04 MED ORDER — EPINEPHRINE HCL 1 MG/ML IJ SOLN
INTRAMUSCULAR | Status: AC
  Filled 2012-10-04: qty 1

## 2012-10-04 MED ORDER — PHENYLEPHRINE HCL 2.5 % OP SOLN
1.0000 [drp] | OPHTHALMIC | Status: AC
  Administered 2012-10-04 (×3): 1 [drp] via OPHTHALMIC

## 2012-10-04 MED ORDER — POVIDONE-IODINE 5 % OP SOLN
OPHTHALMIC | Status: DC | PRN
  Administered 2012-10-04: 1 via OPHTHALMIC

## 2012-10-04 MED ORDER — BSS IO SOLN
INTRAOCULAR | Status: DC | PRN
  Administered 2012-10-04: 15 mL via INTRAOCULAR

## 2012-10-04 MED ORDER — FENTANYL CITRATE 0.05 MG/ML IJ SOLN: 25.0000 ug | INTRAMUSCULAR | Status: DC | PRN

## 2012-10-04 MED ORDER — PHENYLEPHRINE HCL 2.5 % OP SOLN
OPHTHALMIC | Status: AC
  Filled 2012-10-04: qty 2

## 2012-10-04 MED ORDER — MIDAZOLAM HCL 2 MG/2ML IJ SOLN
1.0000 mg | INTRAMUSCULAR | Status: AC | PRN
  Administered 2012-10-04 (×2): 1 mg via INTRAVENOUS
  Administered 2012-10-04: 2 mg via INTRAVENOUS

## 2012-10-04 MED ORDER — LIDOCAINE HCL (PF) 1 % IJ SOLN
INTRAMUSCULAR | Status: DC | PRN
  Administered 2012-10-04: .7 mL

## 2012-10-04 MED ORDER — ONDANSETRON HCL 4 MG/2ML IJ SOLN: 4.0000 mg | Freq: Once | INTRAMUSCULAR | Status: DC | PRN

## 2012-10-04 MED ORDER — PROVISC 10 MG/ML IO SOLN
INTRAOCULAR | Status: DC | PRN
  Administered 2012-10-04: 8.5 mg via INTRAOCULAR

## 2012-10-04 MED ORDER — LACTATED RINGERS IV SOLN
INTRAVENOUS | Status: DC
  Administered 2012-10-04: 10:00:00 via INTRAVENOUS

## 2012-10-04 SURGICAL SUPPLY — 32 items
CAPSULAR TENSION RING-AMO (OPHTHALMIC RELATED) IMPLANT
CLOTH BEACON ORANGE TIMEOUT ST (SAFETY) ×2 IMPLANT
EYE SHIELD UNIVERSAL CLEAR (GAUZE/BANDAGES/DRESSINGS) ×2 IMPLANT
GLOVE BIO SURGEON STRL SZ 6.5 (GLOVE) IMPLANT
GLOVE BIOGEL PI IND STRL 6.5 (GLOVE) IMPLANT
GLOVE BIOGEL PI IND STRL 7.0 (GLOVE) ×2 IMPLANT
GLOVE BIOGEL PI IND STRL 7.5 (GLOVE) IMPLANT
GLOVE BIOGEL PI INDICATOR 6.5 (GLOVE)
GLOVE BIOGEL PI INDICATOR 7.0 (GLOVE) ×2
GLOVE BIOGEL PI INDICATOR 7.5 (GLOVE)
GLOVE ECLIPSE 6.5 STRL STRAW (GLOVE) IMPLANT
GLOVE ECLIPSE 7.0 STRL STRAW (GLOVE) IMPLANT
GLOVE ECLIPSE 7.5 STRL STRAW (GLOVE) IMPLANT
GLOVE EXAM NITRILE LRG STRL (GLOVE) IMPLANT
GLOVE EXAM NITRILE MD LF STRL (GLOVE) IMPLANT
GLOVE SKINSENSE NS SZ6.5 (GLOVE)
GLOVE SKINSENSE NS SZ7.0 (GLOVE)
GLOVE SKINSENSE STRL SZ6.5 (GLOVE) IMPLANT
GLOVE SKINSENSE STRL SZ7.0 (GLOVE) IMPLANT
KIT VITRECTOMY (OPHTHALMIC RELATED) IMPLANT
PAD ARMBOARD 7.5X6 YLW CONV (MISCELLANEOUS) ×2 IMPLANT
PROC W NO LENS (INTRAOCULAR LENS)
PROC W SPEC LENS (INTRAOCULAR LENS)
PROCESS W NO LENS (INTRAOCULAR LENS) IMPLANT
PROCESS W SPEC LENS (INTRAOCULAR LENS) IMPLANT
RING MALYGIN (MISCELLANEOUS) IMPLANT
SIGHTPATH CAT PROC W REG LENS (Ophthalmic Related) ×2 IMPLANT
SYR TB 1ML LL NO SAFETY (SYRINGE) ×2 IMPLANT
TAPE SURG TRANSPORE 1 IN (GAUZE/BANDAGES/DRESSINGS) ×1 IMPLANT
TAPE SURGICAL TRANSPORE 1 IN (GAUZE/BANDAGES/DRESSINGS) ×1
VISCOELASTIC ADDITIONAL (OPHTHALMIC RELATED) IMPLANT
WATER STERILE IRR 250ML POUR (IV SOLUTION) ×2 IMPLANT

## 2012-10-04 NOTE — Op Note (Signed)
Date of Admission: 10/04/2012  Date of Surgery: 10/04/2012  Pre-Op Dx: Cataract  Right  Eye  Post-Op Dx: Cataract  Right  Eye,  Dx Code 366.19  Surgeon: Gemma Payor, M.D.  Assistants: None  Anesthesia: Topical with MAC  Indications: Painless, progressive loss of vision with compromise of daily activities.  Surgery: Cataract Extraction with Intraocular lens Implant Right Eye  Discription: The patient had dilating drops and viscous lidocaine placed into the left eye in the pre-op holding area. After transfer to the operating room, a time out was performed. The patient was then prepped and draped. Beginning with a 75 degree blade a paracentesis port was made at the surgeon's 2 o'clock position. The anterior chamber was then filled with 1% non-preserved lidocaine. This was followed by filling the anterior chamber with Provisc. A bent cystatome needle was used to create a continuous tear capsulotomy. Hydrodissection was performed with balanced salt solution on a Fine canula. The lens nucleus was then removed using the phacoemulsification handpiece. Residual cortex was removed with the I&A handpiece. The anterior chamber and capsular bag were refilled with Provisc. A posterior chamber intraocular lens was placed into the capsular bag with it's injector. The implant was positioned with the Kuglan hook. The Provisc was then removed from the anterior chamber and capsular bag with the I&A handpiece. Stromal hydration of the main incision and paracentesis port was performed with BSS on a Fine canula. The wounds were tested for leak which was negative. The patient tolerated the procedure well. There were no operative complications. The patient was then transferred to the recovery room in stable condition.  Complications: None  Specimen: None  EBL: None  Prosthetic device: B&L enVista, MX60, power 18.5D, SN [id number].

## 2012-10-04 NOTE — Anesthesia Postprocedure Evaluation (Signed)
  Anesthesia Post-op Note  Patient: Mitchell [name]  Procedure(s) Performed: Procedure(s) with comments: CATARACT EXTRACTION PHACO AND INTRAOCULAR LENS PLACEMENT (IOC) (Right) - CDE:12.16  Patient Location: Short Stay  Anesthesia Type:MAC  Level of Consciousness: awake, alert , oriented and patient cooperative  Airway and Oxygen Therapy: Patient Spontanous Breathing  Post-op Pain: none  Post-op Assessment: Post-op Vital signs reviewed, Patient's Cardiovascular Status Stable, Respiratory Function Stable, Patent Airway and Pain level controlled  Post-op Vital Signs: Reviewed and stable  Complications: No apparent anesthesia complications

## 2012-10-04 NOTE — Transfer of Care (Signed)
Immediate Anesthesia Transfer of Care Note  Patient: Mitchell [name]  Procedure(s) Performed: Procedure(s): CATARACT EXTRACTION PHACO AND INTRAOCULAR LENS PLACEMENT (IOC) (Right)  Patient Location: Short Stay  Anesthesia Type:MAC  Level of Consciousness: awake, alert , oriented and patient cooperative  Airway & Oxygen Therapy: Patient Spontanous Breathing  Post-op Assessment: Report given to PACU RN and Post -op Vital signs reviewed and stable  Post vital signs: Reviewed and stable  Complications: No apparent anesthesia complications

## 2012-10-04 NOTE — H&P (Signed)
I have reviewed the H&P, the patient was re-examined, and I have identified no interval changes in medical condition and plan of care since the history and physical of record  

## 2012-10-04 NOTE — Anesthesia Preprocedure Evaluation (Signed)
Anesthesia Evaluation  Patient identified by MRN, date of birth, ID band Patient awake    Reviewed: Allergy & Precautions, H&P , NPO status , Patient's Chart, lab work & pertinent test results, reviewed documented beta blocker date and time   Airway Mallampati: III TM Distance: >3 FB Neck ROM: Full    Dental  (+) Edentulous Upper and Partial Lower   Pulmonary pneumonia -, COPD oxygen dependent, Current Smoker (>60 py hx),  breath sounds clear to auscultation        Cardiovascular hypertension, Pt. on medications + Past MI, +CHF, + Orthopnea and + DOE + dysrhythmias Atrial Fibrillation Rhythm:Irregular Rate:Tachycardia     Neuro/Psych PSYCHIATRIC DISORDERS Anxiety Depression    GI/Hepatic   Endo/Other    Renal/GU Renal disease     Musculoskeletal   Abdominal   Peds  Hematology  (+) Blood dyscrasia, ,   Anesthesia Other Findings   Reproductive/Obstetrics                           Anesthesia Physical Anesthesia Plan  ASA: III  Anesthesia Plan: MAC   Post-op Pain Management:    Induction: Intravenous  Airway Management Planned: Nasal Cannula  Additional Equipment:   Intra-op Plan:   Post-operative Plan:   Informed Consent: I have reviewed the patients History and Physical, chart, labs and discussed the procedure including the risks, benefits and alternatives for the proposed anesthesia with the patient or authorized representative who has indicated his/her understanding and acceptance.     Plan Discussed with:   Anesthesia Plan Comments:         Anesthesia Quick Evaluation

## 2012-10-04 NOTE — Transfer of Care (Signed)
Immediate Anesthesia Transfer of Care Note  Patient: Mitchell [name]  Procedure(s) Performed: Procedure(s) with comments: CATARACT EXTRACTION PHACO AND INTRAOCULAR LENS PLACEMENT (IOC) (Right) - CDE:12.16  Patient Location: Short Stay  Anesthesia Type:MAC  Level of Consciousness: awake, alert , oriented and patient cooperative  Airway & Oxygen Therapy: Patient Spontanous Breathing  Post-op Assessment: Report given to PACU RN and Post -op Vital signs reviewed and stable  Post vital signs: Reviewed and stable  Complications: No apparent anesthesia complications

## 2012-10-06 ENCOUNTER — Encounter (HOSPITAL_COMMUNITY): Payer: Self-pay | Admitting: Ophthalmology

## 2012-10-11 ENCOUNTER — Encounter (HOSPITAL_COMMUNITY): Payer: Self-pay | Admitting: *Deleted

## 2012-10-11 ENCOUNTER — Encounter (HOSPITAL_COMMUNITY): Payer: Medicare Other

## 2012-10-15 MED ORDER — TETRACAINE HCL 0.5 % OP SOLN
OPHTHALMIC | Status: AC
  Filled 2012-10-15: qty 2

## 2012-10-15 MED ORDER — LIDOCAINE HCL (PF) 1 % IJ SOLN
INTRAMUSCULAR | Status: AC
  Filled 2012-10-15: qty 2

## 2012-10-15 MED ORDER — LIDOCAINE HCL 3.5 % OP GEL
OPHTHALMIC | Status: AC
  Filled 2012-10-15: qty 5

## 2012-10-15 MED ORDER — CYCLOPENTOLATE-PHENYLEPHRINE 0.2-1 % OP SOLN
OPHTHALMIC | Status: AC
  Filled 2012-10-15: qty 2

## 2012-10-15 MED ORDER — NEOMYCIN-POLYMYXIN-DEXAMETH 3.5-10000-0.1 OP OINT
TOPICAL_OINTMENT | OPHTHALMIC | Status: AC
  Filled 2012-10-15: qty 3.5

## 2012-10-18 ENCOUNTER — Ambulatory Visit (HOSPITAL_COMMUNITY): Payer: Medicare Other | Admitting: Anesthesiology

## 2012-10-18 ENCOUNTER — Ambulatory Visit (HOSPITAL_COMMUNITY)
Admission: RE | Admit: 2012-10-18 | Discharge: 2012-10-18 | Disposition: A | Discharge status: Home / Self Care | Payer: Medicare Other | Source: Ambulatory Visit | Attending: Ophthalmology | Admitting: Ophthalmology

## 2012-10-18 ENCOUNTER — Encounter (HOSPITAL_COMMUNITY): Payer: Self-pay | Admitting: *Deleted

## 2012-10-18 ENCOUNTER — Encounter (HOSPITAL_COMMUNITY): Payer: Self-pay | Admitting: Anesthesiology

## 2012-10-18 ENCOUNTER — Encounter (HOSPITAL_COMMUNITY)
Admission: RE | Disposition: A | Discharge status: Home / Self Care | Payer: Self-pay | Source: Ambulatory Visit | Attending: Ophthalmology

## 2012-10-18 DIAGNOSIS — H2510 Age-related nuclear cataract, unspecified eye: Secondary | ICD-10-CM | POA: Insufficient documentation

## 2012-10-18 DIAGNOSIS — J449 Chronic obstructive pulmonary disease, unspecified: Secondary | ICD-10-CM | POA: Insufficient documentation

## 2012-10-18 DIAGNOSIS — I10 Essential (primary) hypertension: Secondary | ICD-10-CM | POA: Insufficient documentation

## 2012-10-18 DIAGNOSIS — J4489 Other specified chronic obstructive pulmonary disease: Secondary | ICD-10-CM | POA: Insufficient documentation

## 2012-10-18 DIAGNOSIS — Z9981 Dependence on supplemental oxygen: Secondary | ICD-10-CM | POA: Insufficient documentation

## 2012-10-18 HISTORY — PX: CATARACT EXTRACTION W/PHACO: SHX586

## 2012-10-18 SURGERY — PHACOEMULSIFICATION, CATARACT, WITH IOL INSERTION
Anesthesia: Monitor Anesthesia Care | Site: Eye | Laterality: Left | Wound class: Clean

## 2012-10-18 MED ORDER — MUPIROCIN 2 % EX OINT
TOPICAL_OINTMENT | Freq: Two times a day (BID) | CUTANEOUS | Status: DC
  Administered 2012-10-18: 12:00:00 via NASAL

## 2012-10-18 MED ORDER — TETRACAINE HCL 0.5 % OP SOLN
1.0000 [drp] | OPHTHALMIC | Status: AC
  Administered 2012-10-18 (×3): 1 [drp] via OPHTHALMIC

## 2012-10-18 MED ORDER — LACTATED RINGERS IV SOLN
INTRAVENOUS | Status: DC
  Administered 2012-10-18: 12:00:00 via INTRAVENOUS

## 2012-10-18 MED ORDER — MIDAZOLAM HCL 2 MG/2ML IJ SOLN
1.0000 mg | INTRAMUSCULAR | Status: DC | PRN
  Administered 2012-10-18: 2 mg via INTRAVENOUS

## 2012-10-18 MED ORDER — PROVISC 10 MG/ML IO SOLN
INTRAOCULAR | Status: DC | PRN
  Administered 2012-10-18: 8.5 mg via INTRAOCULAR

## 2012-10-18 MED ORDER — NEOMYCIN-POLYMYXIN-DEXAMETH 0.1 % OP OINT
TOPICAL_OINTMENT | OPHTHALMIC | Status: DC | PRN
  Administered 2012-10-18: 1 via OPHTHALMIC

## 2012-10-18 MED ORDER — POVIDONE-IODINE 5 % OP SOLN
OPHTHALMIC | Status: DC | PRN
  Administered 2012-10-18: 1 via OPHTHALMIC

## 2012-10-18 MED ORDER — ONDANSETRON HCL 4 MG/2ML IJ SOLN
INTRAMUSCULAR | Status: AC
  Filled 2012-10-18: qty 2

## 2012-10-18 MED ORDER — BSS IO SOLN
INTRAOCULAR | Status: DC | PRN
  Administered 2012-10-18: 15 mL via INTRAOCULAR

## 2012-10-18 MED ORDER — EPINEPHRINE HCL 1 MG/ML IJ SOLN
INTRAMUSCULAR | Status: AC
  Filled 2012-10-18: qty 1

## 2012-10-18 MED ORDER — LIDOCAINE HCL 3.5 % OP GEL
1.0000 "application " | Freq: Once | OPHTHALMIC | Status: AC
  Administered 2012-10-18: 1 via OPHTHALMIC

## 2012-10-18 MED ORDER — MUPIROCIN 2 % EX OINT
TOPICAL_OINTMENT | CUTANEOUS | Status: AC
  Filled 2012-10-18: qty 22

## 2012-10-18 MED ORDER — ONDANSETRON HCL 4 MG/2ML IJ SOLN
4.0000 mg | Freq: Once | INTRAMUSCULAR | Status: AC
  Administered 2012-10-18: 4 mg via INTRAVENOUS

## 2012-10-18 MED ORDER — MIDAZOLAM HCL 2 MG/2ML IJ SOLN
INTRAMUSCULAR | Status: AC
  Filled 2012-10-18: qty 2

## 2012-10-18 MED ORDER — PHENYLEPHRINE HCL 2.5 % OP SOLN
1.0000 [drp] | OPHTHALMIC | Status: AC
  Administered 2012-10-18 (×3): 1 [drp] via OPHTHALMIC

## 2012-10-18 MED ORDER — EPINEPHRINE HCL 1 MG/ML IJ SOLN
INTRAOCULAR | Status: DC | PRN
  Administered 2012-10-18: 12:00:00

## 2012-10-18 MED ORDER — LIDOCAINE HCL (PF) 1 % IJ SOLN
INTRAMUSCULAR | Status: DC | PRN
  Administered 2012-10-18: .3 mL

## 2012-10-18 MED ORDER — CYCLOPENTOLATE-PHENYLEPHRINE 0.2-1 % OP SOLN
1.0000 [drp] | OPHTHALMIC | Status: AC
  Administered 2012-10-18 (×3): 1 [drp] via OPHTHALMIC

## 2012-10-18 SURGICAL SUPPLY — 31 items
CAPSULAR TENSION RING-AMO (OPHTHALMIC RELATED) IMPLANT
CLOTH BEACON ORANGE TIMEOUT ST (SAFETY) ×2 IMPLANT
EYE SHIELD UNIVERSAL CLEAR (GAUZE/BANDAGES/DRESSINGS) ×2 IMPLANT
GLOVE BIO SURGEON STRL SZ 6.5 (GLOVE) IMPLANT
GLOVE BIOGEL PI IND STRL 6.5 (GLOVE) ×1 IMPLANT
GLOVE BIOGEL PI IND STRL 7.0 (GLOVE) ×1 IMPLANT
GLOVE BIOGEL PI IND STRL 7.5 (GLOVE) IMPLANT
GLOVE BIOGEL PI INDICATOR 6.5 (GLOVE) ×1
GLOVE BIOGEL PI INDICATOR 7.0 (GLOVE) ×1
GLOVE BIOGEL PI INDICATOR 7.5 (GLOVE)
GLOVE ECLIPSE 6.5 STRL STRAW (GLOVE) IMPLANT
GLOVE ECLIPSE 7.0 STRL STRAW (GLOVE) IMPLANT
GLOVE ECLIPSE 7.5 STRL STRAW (GLOVE) IMPLANT
GLOVE EXAM NITRILE LRG STRL (GLOVE) IMPLANT
GLOVE EXAM NITRILE MD LF STRL (GLOVE) IMPLANT
GLOVE SKINSENSE NS SZ6.5 (GLOVE)
GLOVE SKINSENSE NS SZ7.0 (GLOVE)
GLOVE SKINSENSE STRL SZ6.5 (GLOVE) IMPLANT
GLOVE SKINSENSE STRL SZ7.0 (GLOVE) IMPLANT
KIT VITRECTOMY (OPHTHALMIC RELATED) IMPLANT
PAD ARMBOARD 7.5X6 YLW CONV (MISCELLANEOUS) ×2 IMPLANT
PROC W NO LENS (INTRAOCULAR LENS)
PROC W SPEC LENS (INTRAOCULAR LENS)
PROCESS W NO LENS (INTRAOCULAR LENS) IMPLANT
PROCESS W SPEC LENS (INTRAOCULAR LENS) IMPLANT
RING MALYGIN (MISCELLANEOUS) IMPLANT
SIGHTPATH CAT PROC W REG LENS (Ophthalmic Related) ×2 IMPLANT
SYR TB 1ML LL NO SAFETY (SYRINGE) ×2 IMPLANT
TAPE CLOTH SOFT 2X10 (GAUZE/BANDAGES/DRESSINGS) ×2 IMPLANT
VISCOELASTIC ADDITIONAL (OPHTHALMIC RELATED) IMPLANT
WATER STERILE IRR 250ML POUR (IV SOLUTION) ×2 IMPLANT

## 2012-10-18 NOTE — Anesthesia Preprocedure Evaluation (Signed)
Anesthesia Evaluation  Patient identified by MRN, date of birth, ID band Patient awake    Reviewed: Allergy & Precautions, H&P , NPO status , Patient's Chart, lab work & pertinent test results, reviewed documented beta blocker date and time   Airway Mallampati: III TM Distance: >3 FB Neck ROM: Full    Dental  (+) Edentulous Upper and Partial Lower   Pulmonary pneumonia -, COPD oxygen dependent, Current Smoker (>60 py hx),  breath sounds clear to auscultation        Cardiovascular hypertension, Pt. on medications + Past MI, +CHF, + Orthopnea and + DOE + dysrhythmias Atrial Fibrillation Rhythm:Irregular Rate:Normal     Neuro/Psych PSYCHIATRIC DISORDERS Anxiety Depression    GI/Hepatic   Endo/Other    Renal/GU Renal disease     Musculoskeletal   Abdominal   Peds  Hematology  (+) Blood dyscrasia, ,   Anesthesia Other Findings   Reproductive/Obstetrics                           Anesthesia Physical Anesthesia Plan  ASA: III  Anesthesia Plan: MAC   Post-op Pain Management:    Induction: Intravenous  Airway Management Planned: Nasal Cannula  Additional Equipment:   Intra-op Plan:   Post-operative Plan:   Informed Consent: I have reviewed the patients History and Physical, chart, labs and discussed the procedure including the risks, benefits and alternatives for the proposed anesthesia with the patient or authorized representative who has indicated his/her understanding and acceptance.     Plan Discussed with:   Anesthesia Plan Comments:         Anesthesia Quick Evaluation

## 2012-10-18 NOTE — Transfer of Care (Signed)
Immediate Anesthesia Transfer of Care Note  Patient: Mitchell [name]  Procedure(s) Performed: Procedure(s) with comments: CATARACT EXTRACTION PHACO AND INTRAOCULAR LENS PLACEMENT (IOC) (Left) - CDE:14.42  Patient Location: PACU and Short Stay  Anesthesia Type:MAC  Level of Consciousness: awake  Airway & Oxygen Therapy: Patient Spontanous Breathing  Post-op Assessment: Report given to PACU RN  Post vital signs: Reviewed  Complications: No apparent anesthesia complications

## 2012-10-18 NOTE — Anesthesia Postprocedure Evaluation (Signed)
  Anesthesia Post-op Note  Patient: Mitchell [name]  Procedure(s) Performed: Procedure(s) with comments: CATARACT EXTRACTION PHACO AND INTRAOCULAR LENS PLACEMENT (IOC) (Left) - CDE:14.42  Patient Location: PACU and Short Stay  Anesthesia Type:MAC  Level of Consciousness: awake, alert  and oriented  Airway and Oxygen Therapy: Patient Spontanous Breathing  Post-op Pain: none  Post-op Assessment: Post-op Vital signs reviewed, Patient's Cardiovascular Status Stable, Respiratory Function Stable, Patent Airway and No signs of Nausea or vomiting  Post-op Vital Signs: Reviewed and stable  Complications: No apparent anesthesia complications

## 2012-10-18 NOTE — Op Note (Signed)
Date of Admission: 10/18/2012  Date of Surgery: 10/18/2012  Pre-Op Dx: Cataract  Left  Eye  Post-Op Dx: Cataract  Left  Eye,  Dx Code 366.16  Surgeon: Gemma Payor, M.D.  Assistants: None  Anesthesia: Topical with MAC  Indications: Painless, progressive loss of vision with compromise of daily activities.  Surgery: Cataract Extraction with Intraocular lens Implant Left Eye  Discription: The patient had dilating drops and viscous lidocaine placed into the left eye in the pre-op holding area. After transfer to the operating room, a time out was performed. The patient was then prepped and draped. Beginning with a 75 degree blade a paracentesis port was made at the surgeon's 2 o'clock position. The anterior chamber was then filled with 1% non-preserved lidocaine. This was followed by filling the anterior chamber with Provisc. A bent cystatome needle was used to create a continuous tear capsulotomy. Hydrodissection was performed with balanced salt solution on a Fine canula. The lens nucleus was then removed using the phacoemulsification handpiece. Residual cortex was removed with the I&A handpiece. The anterior chamber and capsular bag were refilled with Provisc. A posterior chamber intraocular lens was placed into the capsular bag with it's injector. The implant was positioned with the Kuglan hook. The Provisc was then removed from the anterior chamber and capsular bag with the I&A handpiece. Stromal hydration of the main incision and paracentesis port was performed with BSS on a Fine canula. The wounds were tested for leak which was negative. The patient tolerated the procedure well. There were no operative complications. The patient was then transferred to the recovery room in stable condition.  Complications: None  Specimen: None  EBL: None  Prosthetic device: B&L enVista, MX60, power 19.0D, SN [id number].

## 2012-10-18 NOTE — H&P (Signed)
I have reviewed the H&P, the patient was re-examined, and I have identified no interval changes in medical condition and plan of care since the history and physical of record  

## 2012-10-19 ENCOUNTER — Encounter (HOSPITAL_COMMUNITY): Payer: Self-pay | Admitting: Ophthalmology

## 2012-11-20 DIAGNOSIS — R0602 Shortness of breath: Secondary | ICD-10-CM

## 2012-12-16 DIAGNOSIS — R079 Chest pain, unspecified: Secondary | ICD-10-CM

## 2013-01-03 ENCOUNTER — Encounter (HOSPITAL_COMMUNITY): Payer: Self-pay | Admitting: Emergency Medicine

## 2013-01-03 ENCOUNTER — Ambulatory Visit (HOSPITAL_COMMUNITY): Admit: 2013-01-03 | Payer: Self-pay | Admitting: Cardiology

## 2013-01-03 ENCOUNTER — Encounter (HOSPITAL_COMMUNITY)
Admission: EM | Disposition: A | Discharge status: Home / Self Care | Payer: Self-pay | Source: Home / Self Care | Attending: Cardiology

## 2013-01-03 ENCOUNTER — Inpatient Hospital Stay (HOSPITAL_COMMUNITY)
Admission: EM | Admit: 2013-01-03 | Discharge: 2013-01-05 | DRG: 287 | Disposition: A | Discharge status: Home / Self Care | Payer: Medicare Other | Attending: Cardiology | Admitting: Cardiology

## 2013-01-03 DIAGNOSIS — Z823 Family history of stroke: Secondary | ICD-10-CM

## 2013-01-03 DIAGNOSIS — Z8249 Family history of ischemic heart disease and other diseases of the circulatory system: Secondary | ICD-10-CM

## 2013-01-03 DIAGNOSIS — J449 Chronic obstructive pulmonary disease, unspecified: Secondary | ICD-10-CM | POA: Diagnosis present

## 2013-01-03 DIAGNOSIS — I213 ST elevation (STEMI) myocardial infarction of unspecified site: Secondary | ICD-10-CM

## 2013-01-03 DIAGNOSIS — F17200 Nicotine dependence, unspecified, uncomplicated: Secondary | ICD-10-CM | POA: Diagnosis present

## 2013-01-03 DIAGNOSIS — I428 Other cardiomyopathies: Secondary | ICD-10-CM | POA: Diagnosis present

## 2013-01-03 DIAGNOSIS — I4891 Unspecified atrial fibrillation: Secondary | ICD-10-CM | POA: Diagnosis present

## 2013-01-03 DIAGNOSIS — Z87442 Personal history of urinary calculi: Secondary | ICD-10-CM

## 2013-01-03 DIAGNOSIS — N189 Chronic kidney disease, unspecified: Secondary | ICD-10-CM | POA: Diagnosis present

## 2013-01-03 DIAGNOSIS — I509 Heart failure, unspecified: Secondary | ICD-10-CM | POA: Diagnosis present

## 2013-01-03 DIAGNOSIS — I200 Unstable angina: Secondary | ICD-10-CM | POA: Diagnosis present

## 2013-01-03 DIAGNOSIS — Z9849 Cataract extraction status, unspecified eye: Secondary | ICD-10-CM

## 2013-01-03 DIAGNOSIS — I129 Hypertensive chronic kidney disease with stage 1 through stage 4 chronic kidney disease, or unspecified chronic kidney disease: Secondary | ICD-10-CM | POA: Diagnosis present

## 2013-01-03 DIAGNOSIS — Z7901 Long term (current) use of anticoagulants: Secondary | ICD-10-CM

## 2013-01-03 DIAGNOSIS — R079 Chest pain, unspecified: Secondary | ICD-10-CM

## 2013-01-03 DIAGNOSIS — K589 Irritable bowel syndrome without diarrhea: Secondary | ICD-10-CM | POA: Diagnosis present

## 2013-01-03 DIAGNOSIS — Z856 Personal history of leukemia: Secondary | ICD-10-CM

## 2013-01-03 DIAGNOSIS — Z7982 Long term (current) use of aspirin: Secondary | ICD-10-CM

## 2013-01-03 DIAGNOSIS — F411 Generalized anxiety disorder: Secondary | ICD-10-CM | POA: Diagnosis present

## 2013-01-03 DIAGNOSIS — F3289 Other specified depressive episodes: Secondary | ICD-10-CM | POA: Diagnosis present

## 2013-01-03 DIAGNOSIS — I252 Old myocardial infarction: Secondary | ICD-10-CM

## 2013-01-03 DIAGNOSIS — Z961 Presence of intraocular lens: Secondary | ICD-10-CM

## 2013-01-03 DIAGNOSIS — J4489 Other specified chronic obstructive pulmonary disease: Secondary | ICD-10-CM | POA: Diagnosis present

## 2013-01-03 DIAGNOSIS — Z79899 Other long term (current) drug therapy: Secondary | ICD-10-CM

## 2013-01-03 DIAGNOSIS — I2510 Atherosclerotic heart disease of native coronary artery without angina pectoris: Principal | ICD-10-CM | POA: Diagnosis present

## 2013-01-03 DIAGNOSIS — F329 Major depressive disorder, single episode, unspecified: Secondary | ICD-10-CM | POA: Diagnosis present

## 2013-01-03 DIAGNOSIS — Z8546 Personal history of malignant neoplasm of prostate: Secondary | ICD-10-CM

## 2013-01-03 DIAGNOSIS — N390 Urinary tract infection, site not specified: Secondary | ICD-10-CM | POA: Diagnosis present

## 2013-01-03 DIAGNOSIS — I5022 Chronic systolic (congestive) heart failure: Secondary | ICD-10-CM | POA: Diagnosis present

## 2013-01-03 HISTORY — PX: LEFT HEART CATH: SHX5478

## 2013-01-03 HISTORY — DX: Chronic kidney disease, unspecified: N18.9

## 2013-01-03 LAB — COMPREHENSIVE METABOLIC PANEL
ALT: 10 U/L (ref 0–53)
AST: 8 U/L (ref 0–37)
AST: 9 U/L (ref 0–37)
Albumin: 2.5 g/dL — ABNORMAL LOW (ref 3.5–5.2)
CO2: 21 mEq/L (ref 19–32)
CO2: 22 mEq/L (ref 19–32)
Calcium: 7.3 mg/dL — ABNORMAL LOW (ref 8.4–10.5)
Calcium: 8.3 mg/dL — ABNORMAL LOW (ref 8.4–10.5)
Chloride: 112 mEq/L (ref 96–112)
Creatinine, Ser: 0.88 mg/dL (ref 0.50–1.35)
GFR calc non Af Amer: 87 mL/min — ABNORMAL LOW (ref >90)
GFR calc non Af Amer: 89 mL/min — ABNORMAL LOW (ref >90)
Sodium: 141 mEq/L (ref 135–145)
Total Protein: 4.8 g/dL — ABNORMAL LOW (ref 6.0–8.3)

## 2013-01-03 LAB — APTT: aPTT: 33 seconds (ref 24–37)

## 2013-01-03 LAB — TSH: TSH: 0.656 u[IU]/mL (ref 0.350–4.500)

## 2013-01-03 LAB — LIPID PANEL: HDL: 26 mg/dL — ABNORMAL LOW (ref >39)

## 2013-01-03 LAB — CBC WITH DIFFERENTIAL/PLATELET
Eosinophils Absolute: 0.4 10*3/uL (ref 0.0–0.7)
Lymphocytes Relative: 35 % (ref 12–46)
Lymphs Abs: 2.2 10*3/uL (ref 0.7–4.0)
Neutro Abs: 3.1 10*3/uL (ref 1.7–7.7)
Neutrophils Relative %: 50 % (ref 43–77)
Platelets: 182 10*3/uL (ref 150–400)
RBC: 4.15 MIL/uL — ABNORMAL LOW (ref 4.22–5.81)
WBC: 6.2 10*3/uL (ref 4.0–10.5)

## 2013-01-03 LAB — POCT I-STAT, CHEM 8
HCT: 34 % — ABNORMAL LOW (ref 39.0–52.0)
Hemoglobin: 11.6 g/dL — ABNORMAL LOW (ref 13.0–17.0)
Potassium: 3.1 mEq/L — ABNORMAL LOW (ref 3.5–5.1)
Sodium: 144 mEq/L (ref 135–145)
TCO2: 20 mmol/L (ref 0–100)

## 2013-01-03 LAB — PROTIME-INR
INR: 1.23 (ref 0.00–1.49)
INR: 1.07 (ref 0.00–1.49)
Prothrombin Time: 15.2 seconds (ref 11.6–15.2)
Prothrombin Time: 13.7 seconds (ref 11.6–15.2)

## 2013-01-03 LAB — TROPONIN I: Troponin I: <0.3 ng/mL (ref <0.30)

## 2013-01-03 LAB — CBC
Platelets: 183 10*3/uL (ref 150–400)
RBC: 3.8 MIL/uL — ABNORMAL LOW (ref 4.22–5.81)
WBC: 6.8 10*3/uL (ref 4.0–10.5)

## 2013-01-03 LAB — HEMOGLOBIN A1C: Hgb A1c MFr Bld: 5.5 % (ref <5.7)

## 2013-01-03 LAB — PRO B NATRIURETIC PEPTIDE: Pro B Natriuretic peptide (BNP): 3295 pg/mL — ABNORMAL HIGH (ref 0–125)

## 2013-01-03 LAB — CK TOTAL AND CKMB (NOT AT ARMC)
CK, MB: 2.8 ng/mL (ref 0.3–4.0)
Relative Index: INVALID (ref 0.0–2.5)

## 2013-01-03 SURGERY — LEFT HEART CATH
Anesthesia: LOCAL

## 2013-01-03 MED ORDER — FENTANYL CITRATE 0.05 MG/ML IJ SOLN
INTRAMUSCULAR | Status: AC
  Filled 2013-01-03: qty 2

## 2013-01-03 MED ORDER — NITROGLYCERIN 0.2 MG/ML ON CALL CATH LAB
INTRAVENOUS | Status: AC
  Filled 2013-01-03: qty 1

## 2013-01-03 MED ORDER — NITROGLYCERIN 0.4 MG SL SUBL
0.4000 mg | SUBLINGUAL_TABLET | SUBLINGUAL | Status: DC | PRN
  Administered 2013-01-03: 0.4 mg via SUBLINGUAL
  Filled 2013-01-03: qty 75

## 2013-01-03 MED ORDER — POTASSIUM CHLORIDE CRYS ER 20 MEQ PO TBCR
20.0000 meq | EXTENDED_RELEASE_TABLET | Freq: Two times a day (BID) | ORAL | Status: DC
  Administered 2013-01-03 – 2013-01-05 (×6): 20 meq via ORAL
  Filled 2013-01-03 (×6): qty 1

## 2013-01-03 MED ORDER — RIVAROXABAN 20 MG PO TABS
20.0000 mg | ORAL_TABLET | Freq: Every day | ORAL | Status: DC
  Filled 2013-01-03: qty 1

## 2013-01-03 MED ORDER — ATORVASTATIN CALCIUM 20 MG PO TABS
20.0000 mg | ORAL_TABLET | Freq: Every day | ORAL | Status: DC
  Administered 2013-01-03 – 2013-01-05 (×3): 20 mg via ORAL
  Filled 2013-01-03 (×3): qty 1

## 2013-01-03 MED ORDER — LISINOPRIL 40 MG PO TABS
40.0000 mg | ORAL_TABLET | Freq: Every day | ORAL | Status: DC
  Administered 2013-01-03 – 2013-01-05 (×3): 40 mg via ORAL
  Filled 2013-01-03 (×3): qty 1

## 2013-01-03 MED ORDER — MIDAZOLAM HCL 2 MG/2ML IJ SOLN
INTRAMUSCULAR | Status: AC
  Filled 2013-01-03: qty 2

## 2013-01-03 MED ORDER — BIVALIRUDIN 250 MG IV SOLR
INTRAVENOUS | Status: AC
  Filled 2013-01-03: qty 250

## 2013-01-03 MED ORDER — LEVALBUTEROL HCL 0.63 MG/3ML IN NEBU
0.6300 mg | INHALATION_SOLUTION | Freq: Four times a day (QID) | RESPIRATORY_TRACT | Status: DC | PRN
  Administered 2013-01-03: 0.63 mg via RESPIRATORY_TRACT

## 2013-01-03 MED ORDER — CITALOPRAM HYDROBROMIDE 20 MG PO TABS
20.0000 mg | ORAL_TABLET | Freq: Every day | ORAL | Status: DC
  Administered 2013-01-03 – 2013-01-05 (×3): 20 mg via ORAL
  Filled 2013-01-03 (×3): qty 1

## 2013-01-03 MED ORDER — SODIUM CHLORIDE 0.9 % IV SOLN
INTRAVENOUS | Status: AC
  Administered 2013-01-03: 04:00:00 via INTRAVENOUS

## 2013-01-03 MED ORDER — ASPIRIN 81 MG PO CHEW: 324.0000 mg | CHEWABLE_TABLET | ORAL | Status: DC

## 2013-01-03 MED ORDER — CIPROFLOXACIN HCL 500 MG PO TABS
500.0000 mg | ORAL_TABLET | Freq: Two times a day (BID) | ORAL | Status: DC
  Administered 2013-01-03 – 2013-01-05 (×5): 500 mg via ORAL
  Filled 2013-01-03 (×6): qty 1

## 2013-01-03 MED ORDER — POTASSIUM CHLORIDE 20 MEQ/15ML (10%) PO LIQD
ORAL | Status: AC
  Filled 2013-01-03: qty 30

## 2013-01-03 MED ORDER — ALPRAZOLAM 0.25 MG PO TABS: 0.2500 mg | ORAL_TABLET | Freq: Two times a day (BID) | ORAL | Status: DC | PRN

## 2013-01-03 MED ORDER — DIGOXIN 250 MCG PO TABS
0.2500 mg | ORAL_TABLET | Freq: Every day | ORAL | Status: DC
  Administered 2013-01-03 – 2013-01-05 (×3): 0.25 mg via ORAL
  Filled 2013-01-03 (×3): qty 1

## 2013-01-03 MED ORDER — FENTANYL CITRATE 0.05 MG/ML IJ SOLN: 25.0000 ug | INTRAMUSCULAR | Status: DC | PRN

## 2013-01-03 MED ORDER — ALPRAZOLAM 0.5 MG PO TABS
1.0000 mg | ORAL_TABLET | Freq: Two times a day (BID) | ORAL | Status: DC | PRN
  Administered 2013-01-03 – 2013-01-05 (×5): 1 mg via ORAL
  Filled 2013-01-03: qty 1
  Filled 2013-01-03 (×4): qty 2
  Filled 2013-01-03: qty 1

## 2013-01-03 MED ORDER — ASPIRIN 300 MG RE SUPP: 300.0000 mg | RECTAL | Status: DC

## 2013-01-03 MED ORDER — MORPHINE SULFATE 2 MG/ML IJ SOLN
2.0000 mg | Freq: Four times a day (QID) | INTRAMUSCULAR | Status: DC | PRN
  Administered 2013-01-03: 2 mg via INTRAVENOUS
  Filled 2013-01-03: qty 1

## 2013-01-03 MED ORDER — DOCUSATE SODIUM 100 MG PO CAPS
100.0000 mg | ORAL_CAPSULE | ORAL | Status: DC | PRN
  Filled 2013-01-03: qty 1

## 2013-01-03 MED ORDER — ACETAMINOPHEN 325 MG PO TABS
650.0000 mg | ORAL_TABLET | ORAL | Status: DC | PRN
  Administered 2013-01-03 – 2013-01-05 (×4): 650 mg via ORAL
  Filled 2013-01-03 (×5): qty 2

## 2013-01-03 MED ORDER — LEVALBUTEROL HCL 1.25 MG/0.5ML IN NEBU
1.2500 mg | INHALATION_SOLUTION | Freq: Three times a day (TID) | RESPIRATORY_TRACT | Status: DC
  Administered 2013-01-03 – 2013-01-05 (×8): 1.25 mg via RESPIRATORY_TRACT
  Filled 2013-01-03 (×10): qty 0.5

## 2013-01-03 MED ORDER — MOMETASONE FURO-FORMOTEROL FUM 100-5 MCG/ACT IN AERO
2.0000 | INHALATION_SPRAY | Freq: Two times a day (BID) | RESPIRATORY_TRACT | Status: DC
  Administered 2013-01-03 – 2013-01-05 (×6): 2 via RESPIRATORY_TRACT
  Filled 2013-01-03: qty 8.8

## 2013-01-03 MED ORDER — SPIRONOLACTONE 25 MG PO TABS
25.0000 mg | ORAL_TABLET | Freq: Every day | ORAL | Status: DC
  Administered 2013-01-03 – 2013-01-04 (×2): 25 mg via ORAL
  Filled 2013-01-03 (×2): qty 1

## 2013-01-03 MED ORDER — LIDOCAINE HCL (PF) 1 % IJ SOLN
INTRAMUSCULAR | Status: AC
  Filled 2013-01-03: qty 30

## 2013-01-03 MED ORDER — HEPARIN (PORCINE) IN NACL 2-0.9 UNIT/ML-% IJ SOLN
INTRAMUSCULAR | Status: AC
  Filled 2013-01-03: qty 1000

## 2013-01-03 MED ORDER — METOPROLOL SUCCINATE ER 50 MG PO TB24
50.0000 mg | ORAL_TABLET | Freq: Every day | ORAL | Status: DC
  Administered 2013-01-03 – 2013-01-04 (×2): 50 mg via ORAL
  Filled 2013-01-03 (×2): qty 1

## 2013-01-03 MED ORDER — ONDANSETRON HCL 4 MG/2ML IJ SOLN: 4.0000 mg | Freq: Once | INTRAMUSCULAR | Status: AC | PRN

## 2013-01-03 MED ORDER — ASPIRIN EC 81 MG PO TBEC
81.0000 mg | DELAYED_RELEASE_TABLET | Freq: Every day | ORAL | Status: DC
  Administered 2013-01-03 – 2013-01-05 (×3): 81 mg via ORAL
  Filled 2013-01-03 (×3): qty 1

## 2013-01-03 MED ORDER — VENLAFAXINE HCL ER 75 MG PO CP24
75.0000 mg | ORAL_CAPSULE | Freq: Every day | ORAL | Status: DC
  Administered 2013-01-03 – 2013-01-05 (×3): 75 mg via ORAL
  Filled 2013-01-03 (×3): qty 1

## 2013-01-03 NOTE — ED Provider Notes (Signed)
CSN: [id number]     Arrival date & time 01/03/13  0158 History   First MD Initiated Contact with Patient 01/03/13 0210     Chief Complaint  Patient presents with  . Code STEMI   (Consider location/radiation/quality/duration/timing/severity/associated sxs/prior Treatment) Patient is a 67 y.o. male presenting with chest pain. The history is provided by the patient and the EMS personnel.  Chest Pain Pain location:  Substernal area Pain quality: pressure   Pain radiates to:  Does not radiate Pain radiates to the back: no   Onset quality:  Unable to specify Duration:  1 day Timing:  Constant Progression:  Waxing and waning Chronicity:  New Relieved by:  Nitroglycerin Worsened by:  Nothing tried Associated symptoms: diaphoresis, nausea and shortness of breath   Associated symptoms: no abdominal pain, no back pain, no cough, no dizziness, no dysphagia, no fatigue, no fever, no headache, no numbness, not vomiting and no weakness   Shortness of breath:    Severity:  Moderate   Onset quality:  Unable to specify   Duration:  1 day   Timing:  Constant Risk factors: hypertension and male sex     Past Medical History  Diagnosis Date  . COPD (chronic obstructive pulmonary disease)   . Hypertension   . Pneumonia   . Systolic heart failure   . AF (atrial fibrillation) 08/21/2011  . Anxiety   . History of kidney stones   . Cancer   . Prostate cancer   . Leukemia   . Leukemia   . Chronic kidney disease    Past Surgical History  Procedure Laterality Date  . Colon surgery    . Prostate surgery  1999  . Transurethral resection of bladder tumor  08/25/2011    Procedure: TRANSURETHRAL RESECTION OF BLADDER TUMOR (TURBT);  Surgeon: Ky Barban, MD;  Location: [facility];  Service: Urology;  Laterality: N/A;  Transurethral Resection of Bladder Neck  . Cystoscopy with urethral dilatation  12/30/2011    Procedure: CYSTOSCOPY WITH URETHRAL DILATATION;  Surgeon: Ky Barban, MD;  Location:  [facility];  Service: Urology;;  . [place]  12/30/2011    Procedure: CYSTOSCOPY/URETHROTOMY;  Surgeon: Ky Barban, MD;  Location: [facility];  Service: Urology;;  . Foreign body removal  12/30/2011    Procedure: FOREIGN BODY REMOVAL;  Surgeon: Ky Barban, MD;  Location: [facility];  Service: Urology;;  Transurethral Removal of 4 clips from bladder neck  . Partial colectomy  2012    [facility]  . Transurethral resection of bladder neck  07/27/2012    Procedure: TRANSURETHRAL RESECTION OF BLADDER NECK;  Surgeon: Ky Barban, MD;  Location: [facility];  Service: Urology;;  . Cystoscopy with urethral dilatation  07/27/2012    Procedure: CYSTOSCOPY WITH URETHRAL DILATATION;  Surgeon: Ky Barban, MD;  Location: [facility];  Service: Urology;;  . Foreign body removal  07/27/2012    Procedure: FOREIGN BODY REMOVAL-CLIPS BLADDER NECK;  Surgeon: Ky Barban, MD;  Location: [facility];  Service: Urology;;  . Cataract extraction w/phaco Right 10/04/2012    Procedure: CATARACT EXTRACTION PHACO AND INTRAOCULAR LENS PLACEMENT (IOC);  Surgeon: Gemma Payor, MD;  Location: [facility];  Service: Ophthalmology;  Laterality: Right;  CDE:12.16  . Eye surgery    . Cataract extraction w/phaco Left 10/18/2012    Procedure: CATARACT EXTRACTION PHACO AND INTRAOCULAR LENS PLACEMENT (IOC);  Surgeon: Gemma Payor, MD;  Location: [facility];  Service: Ophthalmology;  Laterality: Left;  CDE:14.42   Family  History  Problem Relation Age of Onset  . Heart attack Mother   . Cancer Father   . Stroke Father    History  Substance Use Topics  . Smoking status: Current Every Day Smoker -- 0.50 packs/day for 42 years    Types: Cigarettes  . Smokeless tobacco: Former Neurosurgeon  . Alcohol Use: No    Review of Systems  Constitutional: Positive for diaphoresis. Negative for fever, activity change, appetite change and fatigue.  HENT: Negative for congestion, facial swelling, rhinorrhea and trouble swallowing.   Eyes: Negative for photophobia  and pain.  Respiratory: Positive for shortness of breath. Negative for cough and chest tightness.   Cardiovascular: Positive for chest pain. Negative for leg swelling.  Gastrointestinal: Positive for nausea. Negative for vomiting, abdominal pain, diarrhea and constipation.  Endocrine: Negative for polydipsia and polyuria.  Genitourinary: Negative for dysuria, urgency, decreased urine volume and difficulty urinating.  Musculoskeletal: Negative for back pain and gait problem.  Skin: Negative for color change, rash and wound.  Allergic/Immunologic: Negative for immunocompromised state.  Neurological: Negative for dizziness, facial asymmetry, speech difficulty, weakness, numbness and headaches.  Psychiatric/Behavioral: Negative for confusion, decreased concentration and agitation.    Allergies  Review of patient's allergies indicates no known allergies.  Home Medications   No current outpatient prescriptions on file. BP 145/82  Pulse 76  Temp(Src) 97.4 F (36.3 C) (Oral)  Resp 24  Ht 6' (1.829 m)  Wt 212 lb 8.4 oz (96.4 kg)  BMI 28.82 kg/m2  SpO2 99% Physical Exam  Constitutional: He is oriented to person, place, and time. He appears well-developed and well-nourished. He appears distressed.  HENT:  Head: Normocephalic and atraumatic.  Mouth/Throat: No oropharyngeal exudate.  Eyes: Pupils are equal, round, and reactive to light.  Neck: Normal range of motion. Neck supple.  Cardiovascular: Normal rate and normal heart sounds.  An irregularly irregular rhythm present. Exam reveals no gallop and no friction rub.   No murmur heard. Pulmonary/Chest: Effort normal. No respiratory distress. He has no wheezes. He has rales in the left upper field and the left lower field.  Abdominal: Soft. Bowel sounds are normal. He exhibits no distension and no mass. There is no tenderness. There is no rebound and no guarding.  Musculoskeletal: Normal range of motion. He exhibits no edema and no  tenderness.  Neurological: He is alert and oriented to person, place, and time. GCS eye subscore is 4. GCS verbal subscore is 4. GCS motor subscore is 6.  Skin: Skin is warm. He is diaphoretic.  Psychiatric: He has a normal mood and affect.    ED Course  Procedures (including critical care time) Labs Review Labs Reviewed  APTT - Abnormal; Notable for the following:    aPTT 38 (*)    All other components within normal limits  CBC - Abnormal; Notable for the following:    RBC 3.80 (*)    Hemoglobin 10.6 (*)    HCT 32.8 (*)    RDW 16.1 (*)    All other components within normal limits  PRO B NATRIURETIC PEPTIDE - Abnormal; Notable for the following:    Pro B Natriuretic peptide (BNP) 3295.0 (*)    All other components within normal limits  COMPREHENSIVE METABOLIC PANEL - Abnormal; Notable for the following:    Potassium 3.4 (*)    Glucose, Bld 109 (*)    Calcium 7.3 (*)    Total Protein 4.8 (*)    Albumin 2.5 (*)    Total Bilirubin  0.1 (*)    GFR calc non Af Amer 87 (*)    All other components within normal limits  LIPID PANEL - Abnormal; Notable for the following:    HDL 26 (*)    All other components within normal limits  CBC WITH DIFFERENTIAL - Abnormal; Notable for the following:    RBC 4.15 (*)    Hemoglobin 11.2 (*)    HCT 35.7 (*)    RDW 16.0 (*)    Eosinophils Relative 6 (*)    All other components within normal limits  COMPREHENSIVE METABOLIC PANEL - Abnormal; Notable for the following:    Potassium 3.3 (*)    Calcium 8.3 (*)    Total Protein 5.2 (*)    Albumin 2.7 (*)    Total Bilirubin 0.2 (*)    GFR calc non Af Amer 89 (*)    All other components within normal limits  DIGOXIN LEVEL - Abnormal; Notable for the following:    Digoxin Level 0.5 (*)    All other components within normal limits  POCT I-STAT, CHEM 8 - Abnormal; Notable for the following:    Potassium 3.1 (*)    Glucose, Bld 109 (*)    Hemoglobin 11.6 (*)    HCT 34.0 (*)    All other  components within normal limits  MRSA PCR SCREENING  URINE CULTURE  PROTIME-INR  TROPONIN I  CK TOTAL AND CKMB  HEMOGLOBIN A1C  TROPONIN I  TROPONIN I  PROTIME-INR  APTT  TSH  MAGNESIUM   Imaging Review No results found.   Date: 01/03/2013  Rate: 70  Rhythm: atrial fibrillation  QRS Axis: left  Intervals: unable to determine PR  ST/T Wave abnormalities: nonspecific T wave changes. upsloping ST segment in II, III, V5  Conduction Disutrbances:nonspecific intraventricular conduction delay  Narrative Interpretation:   Old EKG Reviewed: changes noted, upsloping ST segment more pronounced in III, V5    MDM   1. Chest pain   2. STEMI (ST elevation myocardial infarction)    Pt is a 67 y.o. male with Pmhx as above who presents as code STEMI by EMS for STEV1. V2  which are resolved on EKG here  Pt reportedly diaphoretic pale upon EMS arrival, symptom improved though not resolved after 2SL NTG & 325 ASA. He, BP stable, tachypneic, sGCS 14 for some confusion.  Pt states CP present since yesterday, still present, mid sternal.  Dr. Sharyn Lull present shortly after pt arrival, will take pt to cath lab,  Heparin not given in order expedite trip to cath lab.   1. Chest pain   2. STEMI (ST elevation myocardial infarction)         Shanna Cisco, MD 01/03/13 2124

## 2013-01-03 NOTE — ED Notes (Signed)
Per EMS: Patient from home, called EMS in regards to CP. States pain started yesterday morning. Pt seen yesterday at [facility] for kidney infection. Pt received 324 asa in route along with  2 NTG. Pt states he did get relief with the NTG but pain was still severe. Pt has some SOB, nausea and vomiting. Currently Ax4, NAD.

## 2013-01-03 NOTE — CV Procedure (Signed)
A cath report dictated 01/03/2013 dictation number is [id number]

## 2013-01-03 NOTE — Cardiovascular Report (Signed)
NAME:  Mitchell [name], Mitchell [name] NO.:  [id number]  MEDICAL RECORD NO.:  [id number]  LOCATION:  2H26C                        FACILITY:  [facility]  PHYSICIAN:  Zuzu Befort N. Sharyn Lull, M.D. DATE OF BIRTH:  Feb 19, 1946  DATE OF PROCEDURE:  01/03/2013 DATE OF DISCHARGE:                           CARDIAC CATHETERIZATION   PROCEDURE:  Left cardiac cath with selective left and right coronary angiography, left ventriculography via right groin using Judkins technique.  INDICATION FOR THE PROCEDURE:  Mitchell [name] is a 67 year old male with past medical history significant for multiple medical problems, i.e. 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 He came to the ER by EMS as code STEMI was called.  The patient woke up with retrosternal chest pain grade 7/10 associated with shortness of breath.  EKG done on the field initially showed atrial fibrillation with controlled ventricular response.  Q-wave in anteroseptal leads with ST elevation in lead V1 and V2, with baseline artifacts.  Repeat EKG showed atrial fibrillation with Q-waves in anteroseptal leads with normalization of ST elevation.  The patient continued to have chest pain and was brought emergently to cath lab for possible PCI.  PROCEDURE IN DETAIL:  After obtaining informed consent, the patient was brought to the cath lab and was placed on fluoroscopy table.  Right groin was prepped and draped in usual fashion.  Xylocaine 1% was used for local anesthesia in the right groin.  With the help of thin wall needle, 6-French arterial sheath was placed.  The sheath was aspirated and flushed.  Next, 6-French left Judkins catheter was advanced over  the wire under fluoroscopic guidance up to the ascending aorta.  Wire was pulled out.  The catheter was aspirated and connected to the Manifold. Catheter was further advanced and engaged into the left coronary ostium. Multiple views of the left system were taken.  Next, catheter was disengaged and was pulled out over the wire and was replaced with 6- [place] right Judkins catheter, which was advanced over the wire under fluoroscopic guidance up to the ascending aorta.  Wire was pulled out. The catheter was aspirated and connected to the Manifold.  Catheter was further advanced and engaged into right coronary ostium.  Multiple views of the right system were taken.  Next, catheter was disengaged and was pulled out over the wire and was replaced with 6-French pigtail catheter, which was advanced over the wire under fluoroscopic guidance up to the ascending aorta.  Catheter was further advanced across the aortic valve into the LV.  LV pressures were recorded.  Next, LV graft was done in 30-degree RAO position.  Post-angiographic pressures were recorded from LV and then pullback pressures were recorded from aorta. There was no gradient across the aortic valve.  Next, the pigtail catheter was pulled out over the wire.  Sheaths were aspirated and flushed.  FINDINGS:  LV showed LV was moderately enlarged.  Global hypokinesia. EF of 30-35%.  Left main was large which has 15-20% distal stenosis.  LAD has 15-20% proximal and mid stenosis.  Diagonal 1 was moderate size, which has mild disease.  Left circumflex is patent.  OM 1 is large, which has 15-20% ostial and proximal stenosis and 20-25% mid stenosis in inferior branch.  RCA has 15-20% proximal stenosis.  PDA and PLV branches were small, which were patent.  The patient tolerated the procedure well.  There were no complications.  The patient was transferred to the stepdown unit in stable condition.     [name]. Sharyn Lull, M.D.     MNH/MEDQ   D:  01/03/2013  T:  01/03/2013  Job:  [id number]

## 2013-01-03 NOTE — Progress Notes (Signed)
Utilization review completed.  P.J. Dalisha Shively,RN,BSN Case Manager [phone]  

## 2013-01-03 NOTE — H&P (Signed)
Mitchell [name] is an 67 y.o. male.   Chief Complaint: Chest pain associated with shortness of breath HPI: Patient is 67 year old male with past medical history significant for multiple medical problems i.e. coronary artery disease is of questionable anteroseptal wall myocardial infarction, hypertension, chronic atrial fibrillation, history of systolic congestive heart failure, COPD, depression, history of CA of prostate, history of urinary rate tension secondary to bladder neck obstruction, IBS, depression, history of tobacco abuse, history of CLL, came to the ER by EMS escorts time he was called. Patient woke up with retrosternal chest pain grade 7/10 associated with shortness of breath EKG done on the field initially showed her atrial fibrillation with controlled ventricular response  Q-wave in anteroseptal leads with ST elevation in lead V1 V2 with baseline artifacts repeat EKG showed the atrial fibrillation with the Q waves in anteroseptal leads with normalization of the ST elevation  patient continued to have her chest pain her and is brought emergently to cath lab for emergency PCI.  Past Medical History  Diagnosis Date  . COPD (chronic obstructive pulmonary disease)   . Hypertension   . Pneumonia   . Systolic heart failure   . AF (atrial fibrillation) 08/21/2011  . Anxiety   . History of kidney stones   . Cancer   . Prostate cancer   . Leukemia   . Leukemia     Past Surgical History  Procedure Laterality Date  . Colon surgery    . Prostate surgery  1999  . Transurethral resection of bladder tumor  08/25/2011    Procedure: TRANSURETHRAL RESECTION OF BLADDER TUMOR (TURBT);  Surgeon: Ky Barban, MD;  Location: [facility];  Service: Urology;  Laterality: N/A;  Transurethral Resection of Bladder Neck  . Cystoscopy with urethral dilatation  12/30/2011    Procedure: CYSTOSCOPY WITH URETHRAL DILATATION;  Surgeon: Ky Barban, MD;  Location: [facility];  Service: Urology;;  . [place]   12/30/2011    Procedure: CYSTOSCOPY/URETHROTOMY;  Surgeon: Ky Barban, MD;  Location: [facility];  Service: Urology;;  . Foreign body removal  12/30/2011    Procedure: FOREIGN BODY REMOVAL;  Surgeon: Ky Barban, MD;  Location: [facility];  Service: Urology;;  Transurethral Removal of 4 clips from bladder neck  . Partial colectomy  2012    [facility]  . Transurethral resection of bladder neck  07/27/2012    Procedure: TRANSURETHRAL RESECTION OF BLADDER NECK;  Surgeon: Ky Barban, MD;  Location: [facility];  Service: Urology;;  . Cystoscopy with urethral dilatation  07/27/2012    Procedure: CYSTOSCOPY WITH URETHRAL DILATATION;  Surgeon: Ky Barban, MD;  Location: [facility];  Service: Urology;;  . Foreign body removal  07/27/2012    Procedure: FOREIGN BODY REMOVAL-CLIPS BLADDER NECK;  Surgeon: Ky Barban, MD;  Location: [facility];  Service: Urology;;  . Cataract extraction w/phaco Right 10/04/2012    Procedure: CATARACT EXTRACTION PHACO AND INTRAOCULAR LENS PLACEMENT (IOC);  Surgeon: Gemma Payor, MD;  Location: [facility];  Service: Ophthalmology;  Laterality: Right;  CDE:12.16  . Eye surgery    . Cataract extraction w/phaco Left 10/18/2012    Procedure: CATARACT EXTRACTION PHACO AND INTRAOCULAR LENS PLACEMENT (IOC);  Surgeon: Gemma Payor, MD;  Location: [facility];  Service: Ophthalmology;  Laterality: Left;  CDE:14.42    Family History  Problem Relation Age of Onset  . Heart attack Mother   . Cancer Father   . Stroke Father    Social History:  reports that he  has been smoking Cigarettes.  He has a 21 pack-year smoking history. He has quit using smokeless tobacco. He reports that he does not drink alcohol or use illicit drugs.  Allergies: No Known Allergies  Medications Prior to Admission  Medication Sig Dispense Refill  . albuterol (PROVENTIL HFA;VENTOLIN HFA) 108 (90 BASE) MCG/ACT inhaler Inhale 2 puffs into the lungs every 6 (six) hours as needed. For shortness of breath       . ALPRAZolam  (XANAX) 1 MG tablet Take 1 mg by mouth 3 (three) times daily as needed for anxiety. For sleep      . citalopram (CELEXA) 40 MG tablet Take 40 mg by mouth daily.        . digoxin (LANOXIN) 0.25 MG tablet Take 0.25 mg by mouth daily.      Marland Kitchen diltiazem (CARDIZEM CD) 180 MG 24 hr capsule Take 180 mg by mouth daily.      Marland Kitchen docusate sodium (COLACE) 100 MG capsule Take 100 mg by mouth as needed. For constipation       . Fluticasone-Salmeterol (ADVAIR DISKUS) 250-50 MCG/DOSE AEPB Inhale 1 puff into the lungs every 12 (twelve) hours.        . furosemide (LASIX) 40 MG tablet Take 1 tablet (40 mg total) by mouth 2 (two) times daily.  60 tablet  0  . ipratropium-albuterol (DUONEB) 0.5-2.5 (3) MG/3ML SOLN Take 3 mLs by nebulization every 6 (six) hours as needed.      Marland Kitchen lisinopril (PRINIVIL,ZESTRIL) 40 MG tablet Take 40 mg by mouth daily.      . metoprolol succinate (TOPROL-XL) 50 MG 24 hr tablet Take 50 mg by mouth daily. Take with or immediately following a meal.      . mometasone (ELOCON) 0.1 % cream Apply 1 application topically daily as needed. Skin lesions       . polyethylene glycol powder (GLYCOLAX/MIRALAX) powder Take 17 g by mouth daily as needed.       . potassium chloride (KLOR-CON) 10 MEQ CR tablet Take 4 tablets (40 mEq total) by mouth 2 (two) times daily.  240 tablet  0  . Rivaroxaban (XARELTO) 20 MG TABS Take 1 tablet by mouth daily.      Marland Kitchen venlafaxine XR (EFFEXOR-XR) 75 MG 24 hr capsule Take 75 mg by mouth daily.        No results found for this or any previous visit (from the past 48 hour(s)). No results found.  Review of Systems  Respiratory: Positive for shortness of breath. Negative for cough and sputum production.   Cardiovascular: Positive for chest pain.  Gastrointestinal: Negative for vomiting and abdominal pain.    Blood pressure 130/78, pulse 72, temperature 97.7 F (36.5 C), temperature source Oral, resp. rate 30. Physical Exam  Constitutional: He is oriented to person,  place, and time.  HENT:  Head: Normocephalic and atraumatic.  Eyes: Conjunctivae are normal. Left eye exhibits no discharge. No scleral icterus.  Neck: Normal range of motion. Neck supple. No JVD present. No tracheal deviation present. No thyromegaly present.  Cardiovascular:  Irregularly irregular S1 and S2 soft and S3 gallop noted  Respiratory:  Decreased breath sound at bases anterolaterally  GI: Soft. Bowel sounds are normal. He exhibits no distension. There is no tenderness. There is no rebound and no guarding.  Musculoskeletal: He exhibits no edema and no tenderness.  Neurological: He is alert and oriented to person, place, and time.     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

## 2013-01-04 LAB — BASIC METABOLIC PANEL
CO2: 26 mEq/L (ref 19–32)
CO2: 21 mEq/L (ref 19–32)
Calcium: 9 mg/dL (ref 8.4–10.5)
Calcium: 9.1 mg/dL (ref 8.4–10.5)
Creatinine, Ser: 1.19 mg/dL (ref 0.50–1.35)
GFR calc non Af Amer: >90 mL/min (ref >90)
GFR calc non Af Amer: 61 mL/min — ABNORMAL LOW (ref >90)
Glucose, Bld: 87 mg/dL (ref 70–99)
Glucose, Bld: 114 mg/dL — ABNORMAL HIGH (ref 70–99)
Potassium: 2.8 mEq/L — ABNORMAL LOW (ref 3.5–5.1)
Sodium: 143 mEq/L (ref 135–145)

## 2013-01-04 LAB — PRO B NATRIURETIC PEPTIDE: Pro B Natriuretic peptide (BNP): 6154 pg/mL — ABNORMAL HIGH (ref 0–125)

## 2013-01-04 LAB — CBC
Hemoglobin: 13.1 g/dL (ref 13.0–17.0)
MCH: 27.8 pg (ref 26.0–34.0)
MCHC: 32.8 g/dL (ref 30.0–36.0)
Platelets: 220 10*3/uL (ref 150–400)
RBC: 4.72 MIL/uL (ref 4.22–5.81)

## 2013-01-04 LAB — URINE CULTURE
Colony Count: NO GROWTH
Culture: NO GROWTH

## 2013-01-04 LAB — GLUCOSE, CAPILLARY: Glucose-Capillary: 107 mg/dL — ABNORMAL HIGH (ref 70–99)

## 2013-01-04 MED ORDER — HALOPERIDOL 1 MG PO TABS
1.0000 mg | ORAL_TABLET | Freq: Three times a day (TID) | ORAL | Status: DC | PRN
  Filled 2013-01-04: qty 1

## 2013-01-04 MED ORDER — ONDANSETRON HCL 4 MG/2ML IJ SOLN
4.0000 mg | Freq: Three times a day (TID) | INTRAMUSCULAR | Status: DC | PRN
  Administered 2013-01-05: 4 mg via INTRAVENOUS
  Filled 2013-01-04: qty 2

## 2013-01-04 MED ORDER — HALOPERIDOL LACTATE 5 MG/ML IJ SOLN
1.0000 mg | Freq: Four times a day (QID) | INTRAMUSCULAR | Status: DC | PRN
  Administered 2013-01-05: 1 mg via INTRAVENOUS
  Filled 2013-01-04: qty 0.2

## 2013-01-04 MED ORDER — HALOPERIDOL LACTATE 5 MG/ML IJ SOLN
1.0000 mg | Freq: Once | INTRAMUSCULAR | Status: AC
  Administered 2013-01-04: 1 mg via INTRAVENOUS
  Filled 2013-01-04 (×2): qty 0.2

## 2013-01-04 MED ORDER — HALOPERIDOL LACTATE 5 MG/ML IJ SOLN
1.0000 mg | Freq: Three times a day (TID) | INTRAMUSCULAR | Status: DC | PRN
  Administered 2013-01-04: 1 mg via INTRAMUSCULAR
  Filled 2013-01-04: qty 0.2

## 2013-01-04 MED ORDER — METOPROLOL SUCCINATE ER 100 MG PO TB24
100.0000 mg | ORAL_TABLET | Freq: Every day | ORAL | Status: DC
  Administered 2013-01-05: 100 mg via ORAL
  Filled 2013-01-04: qty 1

## 2013-01-04 MED ORDER — TAMSULOSIN HCL 0.4 MG PO CAPS
0.4000 mg | ORAL_CAPSULE | Freq: Every day | ORAL | Status: DC
  Administered 2013-01-04 – 2013-01-05 (×2): 0.4 mg via ORAL
  Filled 2013-01-04 (×2): qty 1

## 2013-01-04 MED ORDER — SPIRONOLACTONE 25 MG PO TABS
25.0000 mg | ORAL_TABLET | Freq: Two times a day (BID) | ORAL | Status: DC
  Administered 2013-01-04 – 2013-01-05 (×3): 25 mg via ORAL
  Filled 2013-01-04 (×3): qty 1

## 2013-01-04 MED ORDER — NICOTINE 14 MG/24HR TD PT24
14.0000 mg | MEDICATED_PATCH | Freq: Every day | TRANSDERMAL | Status: DC
  Administered 2013-01-04 – 2013-01-05 (×2): 14 mg via TRANSDERMAL
  Filled 2013-01-04 (×2): qty 1

## 2013-01-04 MED ORDER — METOPROLOL SUCCINATE ER 50 MG PO TB24
50.0000 mg | ORAL_TABLET | Freq: Once | ORAL | Status: AC
  Administered 2013-01-04: 50 mg via ORAL
  Filled 2013-01-04: qty 1

## 2013-01-04 MED ORDER — POTASSIUM CHLORIDE CRYS ER 20 MEQ PO TBCR
40.0000 meq | EXTENDED_RELEASE_TABLET | ORAL | Status: AC
  Administered 2013-01-04 (×2): 40 meq via ORAL
  Filled 2013-01-04 (×2): qty 2

## 2013-01-04 MED ORDER — RIVAROXABAN 20 MG PO TABS
20.0000 mg | ORAL_TABLET | Freq: Every day | ORAL | Status: DC
  Administered 2013-01-04 – 2013-01-05 (×2): 20 mg via ORAL
  Filled 2013-01-04 (×2): qty 1

## 2013-01-04 MED FILL — Sodium Chloride IV Soln 0.9%: INTRAVENOUS | Qty: 50 | Status: AC

## 2013-01-04 NOTE — Progress Notes (Signed)
Secretary alerted RN that patient had fallen to floor. Patient has been confused, agitated, and restless. Disobeying orders from the staff. Patient has made multiple attempts to get out of bed. He has pulled off condom cath 4 times. Patient immediately assisted back to chair. VSS- 149/87. Patient assessed no redness or bumps identified. Dr Sharyn Lull notified. Safety zone completed. Fall assessment completed. Will continue to closely monitor.
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
## 2013-01-04 NOTE — Progress Notes (Signed)
Subjective:  Patient denies any chest pain states breathing is improved  Objective:  Vital Signs in the last 24 hours: Temp:  [97.3 F (36.3 C)-98.6 F (37 C)] 97.3 F (36.3 C) (09/09 0800) Pulse Rate:  [76-88] 85 (09/09 0400) Resp:  [15-28] 19 (09/09 0400) BP: (124-156)/(63-95) 153/72 mmHg (09/09 0800) SpO2:  [95 %-100 %] 96 % (09/09 0841)  Intake/Output from previous day: 09/08 0701 - 09/09 0700 In: 1130 [P.O.:980; I.V.:150] Out: 2900 [Urine:2900] Intake/Output from this shift:    Physical Exam: Neck: no adenopathy, no carotid bruit, no JVD and supple, symmetrical, trachea midline Lungs: Decreased breath sound at bases  Heart: irregularly irregular rhythm, S1, S2 normal and Soft systolic murmur and S3 gallop noted Abdomen: soft, non-tender; bowel sounds normal; no masses,  no organomegaly Extremities: extremities normal, atraumatic, no cyanosis or edema and Right groin stable  Lab Results:  Recent Labs  01/03/13 0300 01/03/13 0857  WBC 6.8 6.2  HGB 10.6* 11.2*  PLT 183 182    Recent Labs  01/03/13 0300 01/03/13 0857  NA 138 141  K 3.4* 3.3*  CL 109 112  CO2 21 22  GLUCOSE 109* 94  BUN 12 10  CREATININE 0.88 0.84    Recent Labs  01/03/13 0857 01/03/13 1545  TROPONINI <0.30 <0.30   Hepatic Function Panel  Recent Labs  01/03/13 0857  PROT 5.2*  ALBUMIN 2.7*  AST 9  ALT 10  ALKPHOS 51  BILITOT 0.2*    Recent Labs  01/03/13 0300  CHOL 77   No results found for this basename: PROTIME,  in the last 72 hours  Imaging: Imaging results have been reviewed and No results found.  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

## 2013-01-04 NOTE — Progress Notes (Signed)
Patient has been very difficult to deal with this evening. He has been anxious, restless, yelling at the nursing staff and attempting to get up out of bed with out assistance. He has been incontinent of urine approximately every 10 minutes. Condom catheter has been replaced multiple times. Patient constantly c/o of pain with urinating. Morphine, xanax, and tylenol had already been administered. Dr. Sharyn Lull called and notified. Orders received. Bed alarm in use, will continue to monitor.  Jorge Ny [place]

## 2013-01-05 MED ORDER — HALOPERIDOL LACTATE 5 MG/ML IJ SOLN
1.0000 mg | Freq: Once | INTRAMUSCULAR | Status: AC
  Administered 2013-01-05: 1 mg via INTRAVENOUS
  Filled 2013-01-05: qty 0.2

## 2013-01-05 MED ORDER — CITALOPRAM HYDROBROMIDE 40 MG PO TABS: 20.0000 mg | ORAL_TABLET | Freq: Every day | ORAL | Status: DC

## 2013-01-05 MED ORDER — ATORVASTATIN CALCIUM 20 MG PO TABS: 20.0000 mg | ORAL_TABLET | Freq: Every day | ORAL | Status: DC

## 2013-01-05 MED ORDER — CIPROFLOXACIN HCL 500 MG PO TABS: 500.0000 mg | ORAL_TABLET | Freq: Two times a day (BID) | ORAL | Status: DC

## 2013-01-05 MED ORDER — SPIRONOLACTONE 25 MG PO TABS: 25.0000 mg | ORAL_TABLET | Freq: Two times a day (BID) | ORAL | Status: DC

## 2013-01-05 MED ORDER — METOPROLOL SUCCINATE ER 100 MG PO TB24: 100.0000 mg | ORAL_TABLET | Freq: Every day | ORAL | Status: DC

## 2013-01-05 MED ORDER — ASPIRIN 81 MG PO TBEC: 81.0000 mg | DELAYED_RELEASE_TABLET | Freq: Every day | ORAL | Status: DC

## 2013-01-05 NOTE — Progress Notes (Signed)
Pts wife, Verlon Au given discharge instructions with understanding. PT wife has no questions at this time. Pt medications (stored in pharmacy) given back to wife.  IV d/c. Pt taken out via wheelchair.

## 2013-01-05 NOTE — Discharge Summary (Signed)
NAME:  ISOM, KOCHAN NO.:  [id number]  MEDICAL RECORD NO.:  [id number]  LOCATION:  [id number]                        FACILITY:  [facility]  PHYSICIAN:  Bessy Reaney N. Sharyn Lull, M.D. DATE OF BIRTH:  04-13-1946  DATE OF ADMISSION:  01/03/2013 DATE OF DISCHARGE:  01/05/2013                              DISCHARGE SUMMARY   ADMITTING DIAGNOSES: 1. Acute coronary syndrome. 2. Coronary artery disease with history of possible silent septal wall     MI in the past. 3. Hypertension. 4. Chronic obstructive pulmonary disease. 5. Mild systolic heart failure. 6. History of tobacco abuse. 7. History of CA of prostate. 8. History of CLL. 9. 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 10.Irritable bowel syndrome. 11.Depression. 12.Tobacco abuse. 13.History of recent UTI.  DISCHARGE DIAGNOSES: 1. Status post chest pain. 2. Status post left cardiac cath. 3. Mild coronary artery disease. 4. Hypertension. 5. COPD. 6. Chronic atrial fibrillation. 7. Compensated systolic heart failure.  Arms are resolving systolic     heart failure. 8. History of tobacco abuse. 9. History of CA of prostate. 10.History of CLL. 11.History of urinary retention, secondary to bladder neck obstruction     in the past. 12.Irritable bowel syndrome. 13.Depression. 14.History of tobacco abuse. 15.Resolving UTI.  DISCHARGE HOME MEDICATIONS: 1. Aspirin 81 mg one tablet daily. 2. Atorvastatin 20 mg one tablet daily. 3. Ciprofloxacin one tablet twice daily for 5 more days. 4. Aldactone 25 mg twice daily. 5. Citalopram dose has been reduced to 20 mg daily. 6. Metoprolol succinate 100 mg daily. 7. Continue Advair Diskus 250/50 one puff twice daily. 8. Albuterol inhaler as before. 9. Alprazolam as before 1 mg 3 times daily. 10.Digoxin 0.25 mg one tablet daily. 11.Colace 100 mg p.o. at bedtime for constipation. 12.Gleevec 200 mg daily. 13.DuoNeb nebulizer every 6 hours as  before. 14.Lisinopril 40 mg daily as before. 15.Elocon topical cream as before. 16.MiraLAX as before as needed. 17.Effexor 75 mg twice daily as before. 18.Xarelto 20 mg one tablet daily as before.  The patient has been advised to stopped diltiazem, furosemide, and potassium chloride.  DIET:  Low-salt, low-cholesterol.  ACTIVITY:  Increase activity slowly as tolerated.  Post-cardiac cath instructions have been given.  Follow up with me in 1 week.  CONDITION AT DISCHARGE:  Stable.  BRIEF HISTORY AND HOSPITAL COURSE:  This patient is a 67 year old male with past medical history significant for multiple medical problems, i.e., coronary artery disease questionable anteroseptal wall MI in the past, hypertension, chronic atrial fibrillation, history of systolic heart failure, COPD, depression, history of CA of prostate, history of urinary retention secondary to bladder neck obstruction.  Also he has depression, history of tobacco abuse, history of CLL.  He came to the ER via EMS as code STEMI was called.  The patient woke up with retrosternal chest pain grade 7/10 associated with shortness of breath.  EKG done on the field initially showed atrial fibrillation with controlled ventricular response.  Q-wave in anteroseptal leads with ST elevation in lead I and AV, lead V1 and V2, with baseline artifacts.  Repeat EKG showed atrial fibrillation with Q-waves in anteroseptal leads with normalization of ST elevation.  The patient continued to  have chest pain and was brought emergently to the Cath Lab for emergency PCI.  PHYSICAL EXAMINATION:  GENERAL:  He was alert, awake, oriented x3. VITAL SIGNS:  Blood pressure was 130/78, pulse was 72.  He was afebrile. HEENT:  Conjunctivae pink. NECK:  Supple.  No JVD. LUNGS:  Decreased breath sounds at bases anterolaterally. CARDIOVASCULAR:  Irregularly irregular.  S1 and S2 was soft.  There was soft S3 gallop noted. ABDOMEN:  Soft.  Bowel sounds were  present.  Nontender. EXTREMITIES:  There was no clubbing, cyanosis, or edema.  LABORATORY DATA:  Sodium 144, potassium 3.1, BUN 11, creatinine 0.90, glucose 109.  Hemoglobin 10.6, hematocrit 32.8.  Hemoglobin A1c was 5.5. ProBNP was 3295.  Three sets of troponin-I were negative.  Cholesterol 77, LDL 38, HDL low at 26, triglycerides 66.  Repeat hemoglobin yesterday 13.1, hematocrit 39.9, white count 7.1.  BNP remained elevated to 1154 to 1748 clinically.  The patient was diuresed well and breathing has markedly improved.  BRIEF HOSPITAL COURSE:  The patient was taken directly to the cath lab and underwent left cardiac cath with selective left and right coronary angiography, and LV graphy as per procedure report.  The patient did not have any critical stenosis requiring PCI.  Postprocedure, the patient did not have any chest pain.  In view of his depressed LV systolic function, his calcium channel blocker were discontinued.  Beta-blocker were increased and was started on spironolactone with good diuresis. His potassium was replaced.  The patient did not had any further chest pain during the hospital stay.  His groin was stable with no evidence of hematoma or bruit.  The Xarelto has been restarted.  The patient has been ambulating in hallway without any problems.  The patient had episodes of confusion and agitation requiring Haldol during the hospitalization.  His antidepressant dose has been reduced.  The patient has been advised to follow up with his psychiatrist and medical doctor as outpatient and adjust his medications.  The patient will be discharged on above medications and followed up in my office in 1 week. We will maximize his ACE inhibitor and beta-blocker as tolerated.  Heart failure instructions have been given.  The patient has been advised to refrain from salty food and restrict fluid intake to 1500 mL per day.     [name]. Sharyn Lull, M.D.     [facility]  D:   01/05/2013  T:  01/05/2013  Job:  [id number]

## 2013-01-05 NOTE — Progress Notes (Signed)
Patient calm & cooperative.  Restraints removed.  Will continue to monitor. [place], Mitzi Hansen

## 2013-01-05 NOTE — Discharge Summary (Signed)
Discharge summary dictated on 01/05/2013 dictation number is [id number]

## 2013-01-08 ENCOUNTER — Emergency Department (HOSPITAL_COMMUNITY): Payer: Medicare Other

## 2013-01-08 ENCOUNTER — Emergency Department (HOSPITAL_COMMUNITY)
Admission: EM | Admit: 2013-01-08 | Discharge: 2013-01-08 | Disposition: A | Discharge status: Home / Self Care | Payer: Medicare Other | Attending: Emergency Medicine | Admitting: Emergency Medicine

## 2013-01-08 ENCOUNTER — Encounter (HOSPITAL_COMMUNITY): Payer: Self-pay | Admitting: *Deleted

## 2013-01-08 DIAGNOSIS — N189 Chronic kidney disease, unspecified: Secondary | ICD-10-CM | POA: Insufficient documentation

## 2013-01-08 DIAGNOSIS — Z856 Personal history of leukemia: Secondary | ICD-10-CM | POA: Insufficient documentation

## 2013-01-08 DIAGNOSIS — Z8546 Personal history of malignant neoplasm of prostate: Secondary | ICD-10-CM | POA: Insufficient documentation

## 2013-01-08 DIAGNOSIS — F17200 Nicotine dependence, unspecified, uncomplicated: Secondary | ICD-10-CM | POA: Insufficient documentation

## 2013-01-08 DIAGNOSIS — Z8701 Personal history of pneumonia (recurrent): Secondary | ICD-10-CM | POA: Insufficient documentation

## 2013-01-08 DIAGNOSIS — Z87442 Personal history of urinary calculi: Secondary | ICD-10-CM | POA: Insufficient documentation

## 2013-01-08 DIAGNOSIS — I129 Hypertensive chronic kidney disease with stage 1 through stage 4 chronic kidney disease, or unspecified chronic kidney disease: Secondary | ICD-10-CM | POA: Insufficient documentation

## 2013-01-08 DIAGNOSIS — N1330 Unspecified hydronephrosis: Secondary | ICD-10-CM | POA: Insufficient documentation

## 2013-01-08 DIAGNOSIS — IMO0002 Reserved for concepts with insufficient information to code with codable children: Secondary | ICD-10-CM | POA: Insufficient documentation

## 2013-01-08 DIAGNOSIS — I4891 Unspecified atrial fibrillation: Secondary | ICD-10-CM | POA: Insufficient documentation

## 2013-01-08 DIAGNOSIS — Z7982 Long term (current) use of aspirin: Secondary | ICD-10-CM | POA: Insufficient documentation

## 2013-01-08 DIAGNOSIS — Z79899 Other long term (current) drug therapy: Secondary | ICD-10-CM | POA: Insufficient documentation

## 2013-01-08 DIAGNOSIS — J449 Chronic obstructive pulmonary disease, unspecified: Secondary | ICD-10-CM | POA: Insufficient documentation

## 2013-01-08 DIAGNOSIS — J4489 Other specified chronic obstructive pulmonary disease: Secondary | ICD-10-CM | POA: Insufficient documentation

## 2013-01-08 DIAGNOSIS — I5020 Unspecified systolic (congestive) heart failure: Secondary | ICD-10-CM | POA: Insufficient documentation

## 2013-01-08 DIAGNOSIS — Z9089 Acquired absence of other organs: Secondary | ICD-10-CM | POA: Insufficient documentation

## 2013-01-08 DIAGNOSIS — Z9889 Other specified postprocedural states: Secondary | ICD-10-CM | POA: Insufficient documentation

## 2013-01-08 DIAGNOSIS — R109 Unspecified abdominal pain: Secondary | ICD-10-CM | POA: Insufficient documentation

## 2013-01-08 DIAGNOSIS — R1030 Lower abdominal pain, unspecified: Secondary | ICD-10-CM

## 2013-01-08 DIAGNOSIS — N390 Urinary tract infection, site not specified: Secondary | ICD-10-CM | POA: Insufficient documentation

## 2013-01-08 DIAGNOSIS — F411 Generalized anxiety disorder: Secondary | ICD-10-CM | POA: Insufficient documentation

## 2013-01-08 LAB — CBC WITH DIFFERENTIAL/PLATELET
Basophils Absolute: 0.1 10*3/uL (ref 0.0–0.1)
HCT: 35.2 % — ABNORMAL LOW (ref 39.0–52.0)
Hemoglobin: 11.1 g/dL — ABNORMAL LOW (ref 13.0–17.0)
Lymphocytes Relative: 20 % (ref 12–46)
Monocytes Absolute: 1.1 10*3/uL — ABNORMAL HIGH (ref 0.1–1.0)
Neutro Abs: 6.6 10*3/uL (ref 1.7–7.7)
RBC: 4.03 MIL/uL — ABNORMAL LOW (ref 4.22–5.81)
RDW: 16.3 % — ABNORMAL HIGH (ref 11.5–15.5)
WBC: 10.5 10*3/uL (ref 4.0–10.5)

## 2013-01-08 LAB — URINALYSIS, ROUTINE W REFLEX MICROSCOPIC
Glucose, UA: NEGATIVE mg/dL
pH: 6 (ref 5.0–8.0)

## 2013-01-08 LAB — URINE MICROSCOPIC-ADD ON

## 2013-01-08 LAB — BASIC METABOLIC PANEL
CO2: 29 mEq/L (ref 19–32)
Chloride: 104 mEq/L (ref 96–112)
Creatinine, Ser: 1.32 mg/dL (ref 0.50–1.35)

## 2013-01-08 MED ORDER — TRAMADOL HCL 50 MG PO TABS: 50.0000 mg | ORAL_TABLET | Freq: Four times a day (QID) | ORAL | Status: DC | PRN

## 2013-01-08 MED ORDER — DEXTROSE 5 % IV SOLN
1.0000 g | Freq: Once | INTRAVENOUS | Status: AC
  Administered 2013-01-08: 1 g via INTRAVENOUS
  Filled 2013-01-08: qty 10

## 2013-01-08 MED ORDER — CEPHALEXIN 500 MG PO CAPS: 500.0000 mg | ORAL_CAPSULE | Freq: Four times a day (QID) | ORAL | Status: DC

## 2013-01-08 MED ORDER — KETOROLAC TROMETHAMINE 30 MG/ML IJ SOLN
30.0000 mg | Freq: Once | INTRAMUSCULAR | Status: AC
  Administered 2013-01-08: 30 mg via INTRAVENOUS
  Filled 2013-01-08: qty 1

## 2013-01-08 NOTE — ED Notes (Addendum)
Pt called out twice asking for pain meds. Advised pt edp would be in to see pt shortly.

## 2013-01-08 NOTE — ED Notes (Addendum)
Walked into pt's room and pt appeared to be pale looking. Pt denied chest pain, sob, or any other problems and even stated he was not having any pain. EDP notified.

## 2013-01-08 NOTE — ED Provider Notes (Signed)
CSN: [id number]     Arrival date & time 01/08/13  0134 History   First MD Initiated Contact with Patient 01/08/13 0232     Chief Complaint  Patient presents with  . Groin Pain  . Nephrolithiasis   (Consider location/radiation/quality/duration/timing/severity/associated sxs/prior Treatment) Patient is a 67 y.o. Mitchell [name] presenting with groin pain. The history is provided by the patient.  Groin Pain  He had onset this evening of severe groin pain. He rates pain at 10/10. He states this pain is typical of what he has with his kidney stones and he states he has had 20-30 kidney stones. He denies any abdominal pain or flank pain. He states he did vomit once but is not having any nausea currently. He denies fever chills or sweats. He has not taken anything for pain.  Past Medical History  Diagnosis Date  . COPD (chronic obstructive pulmonary disease)   . Hypertension   . Pneumonia   . Systolic heart failure   . AF (atrial fibrillation) 08/21/2011  . Anxiety   . History of kidney stones   . Cancer   . Prostate cancer   . Leukemia   . Leukemia   . Chronic kidney disease    Past Surgical History  Procedure Laterality Date  . Colon surgery    . Prostate surgery  1999  . Transurethral resection of bladder tumor  08/25/2011    Procedure: TRANSURETHRAL RESECTION OF BLADDER TUMOR (TURBT);  Surgeon: Ky Barban, MD;  Location: [facility];  Service: Urology;  Laterality: N/A;  Transurethral Resection of Bladder Neck  . Cystoscopy with urethral dilatation  12/30/2011    Procedure: CYSTOSCOPY WITH URETHRAL DILATATION;  Surgeon: Ky Barban, MD;  Location: [facility];  Service: Urology;;  . [place]  12/30/2011    Procedure: CYSTOSCOPY/URETHROTOMY;  Surgeon: Ky Barban, MD;  Location: [facility];  Service: Urology;;  . Foreign body removal  12/30/2011    Procedure: FOREIGN BODY REMOVAL;  Surgeon: Ky Barban, MD;  Location: [facility];  Service: Urology;;  Transurethral Removal of 4 clips from  bladder neck  . Partial colectomy  2012    [facility]  . Transurethral resection of bladder neck  07/27/2012    Procedure: TRANSURETHRAL RESECTION OF BLADDER NECK;  Surgeon: Ky Barban, MD;  Location: [facility];  Service: Urology;;  . Cystoscopy with urethral dilatation  07/27/2012    Procedure: CYSTOSCOPY WITH URETHRAL DILATATION;  Surgeon: Ky Barban, MD;  Location: [facility];  Service: Urology;;  . Foreign body removal  07/27/2012    Procedure: FOREIGN BODY REMOVAL-CLIPS BLADDER NECK;  Surgeon: Ky Barban, MD;  Location: [facility];  Service: Urology;;  . Cataract extraction w/phaco Right 10/04/2012    Procedure: CATARACT EXTRACTION PHACO AND INTRAOCULAR LENS PLACEMENT (IOC);  Surgeon: Gemma Payor, MD;  Location: [facility];  Service: Ophthalmology;  Laterality: Right;  CDE:12.16  . Eye surgery    . Cataract extraction w/phaco Left 10/18/2012    Procedure: CATARACT EXTRACTION PHACO AND INTRAOCULAR LENS PLACEMENT (IOC);  Surgeon: Gemma Payor, MD;  Location: [facility];  Service: Ophthalmology;  Laterality: Left;  CDE:14.42   Family History  Problem Relation Age of Onset  . Heart attack Mother   . Cancer Father   . Stroke Father    History  Substance Use Topics  . Smoking status: Current Every Day Smoker -- 0.Mitchell packs/day for 42 years    Types: Cigarettes  . Smokeless tobacco: Former Neurosurgeon  . Alcohol Use: No  Review of Systems  All other systems reviewed and are negative.    Allergies  Review of patient's allergies indicates no known allergies.  Home Medications   Current Outpatient Rx  Name  Route  Sig  Dispense  Refill  . albuterol (PROVENTIL HFA;VENTOLIN HFA) 108 (90 BASE) MCG/ACT inhaler   Inhalation   Inhale 2 puffs into the lungs every 6 (six) hours as needed. For shortness of breath          . ALPRAZolam (XANAX) 1 MG tablet   Oral   Take 1 mg by mouth 3 (three) times daily.         Marland Kitchen aspirin EC 81 MG EC tablet   Oral   Take 1 tablet (81 mg total) by mouth  daily.   30 tablet   3   . atorvastatin (LIPITOR) 20 MG tablet   Oral   Take 1 tablet (20 mg total) by mouth daily at 6 PM.   30 tablet   3   . ciprofloxacin (CIPRO) 500 MG tablet   Oral   Take 1 tablet (500 mg total) by mouth 2 (two) times daily.   10 tablet   0   . citalopram (CELEXA) 40 MG tablet   Oral   Take 0.5 tablets (20 mg total) by mouth daily.   30 tablet   1   . digoxin (LANOXIN) 0.25 MG tablet   Oral   Take 0.25 mg by mouth daily.         Marland Kitchen docusate sodium (COLACE) 100 MG capsule   Oral   Take 100 mg by mouth as needed. For constipation          . Fluticasone-Salmeterol (ADVAIR DISKUS) 250-Mitchell MCG/DOSE AEPB   Inhalation   Inhale 1 puff into the lungs every 12 (twelve) hours.           Marland Kitchen imatinib (GLEEVEC) 100 MG tablet   Oral   Take 200 mg by mouth daily. Take with meals and large glass of water.Caution:Chemotherapy         . ipratropium-albuterol (DUONEB) 0.5-2.5 (3) MG/3ML SOLN   Nebulization   Take 3 mLs by nebulization every 6 (six) hours as needed.         Marland Kitchen lisinopril (PRINIVIL,ZESTRIL) 40 MG tablet   Oral   Take 40 mg by mouth daily.         . metoprolol succinate (TOPROL-XL) 100 MG 24 hr tablet   Oral   Take 1 tablet (100 mg total) by mouth daily. Take with or immediately following a meal.   30 tablet   3   . mometasone (ELOCON) 0.1 % cream   Topical   Apply 1 application topically daily as needed. Skin lesions          . polyethylene glycol powder (GLYCOLAX/MIRALAX) powder   Oral   Take 17 g by mouth daily as needed.          . Rivaroxaban (XARELTO) 20 MG TABS   Oral   Take 1 tablet by mouth daily.         Marland Kitchen spironolactone (ALDACTONE) 25 MG tablet   Oral   Take 1 tablet (25 mg total) by mouth 2 (two) times daily.   60 tablet   3   . venlafaxine (EFFEXOR) 75 MG tablet   Oral   Take 75 mg by mouth 2 (two) times daily.          BP 133/73  Pulse 94  Temp(Src)  98.5 F (36.9 C) (Oral)  Resp 16  Ht 6'  (1.829 m)  Wt 200 lb (90.719 kg)  BMI 27.12 kg/m2  SpO2 93% Physical Exam  Nursing note and vitals reviewed.  67 year old Mitchell [name], resting comfortably and in no acute distress. Vital signs are normal. Oxygen saturation is 93%, which is normal. Head is normocephalic and atraumatic. PERRLA, EOMI. Oropharynx is clear. Neck is nontender and supple without adenopathy or JVD. Back is nontender and there is no CVA tenderness. Lungs are clear without rales, wheezes, or rhonchi. Chest is nontender. Heart has regular rate and rhythm without murmur. Abdomen is soft, flat, nontender without masses or hepatosplenomegaly and peristalsis is normoactive. Extremities have no cyanosis or edema, full range of motion is present. Skin is warm and dry without rash. Neurologic: Mental status is normal, cranial nerves are intact, there are no motor or sensory deficits.  ED Course  Procedures (including critical care time) Labs Review Results for orders placed during the hospital encounter of 01/08/13  URINALYSIS, ROUTINE W REFLEX MICROSCOPIC      Result Value Range   Color, Urine YELLOW  YELLOW   APPearance CLOUDY (*) CLEAR   Specific Gravity, Urine 1.025  1.005 - 1.030   pH 6.0  5.0 - 8.0   Glucose, UA NEGATIVE  NEGATIVE mg/dL   Hgb urine dipstick LARGE (*) NEGATIVE   Bilirubin Urine NEGATIVE  NEGATIVE   Ketones, ur NEGATIVE  NEGATIVE mg/dL   Protein, ur 161 (*) NEGATIVE mg/dL   Urobilinogen, UA 0.2  0.0 - 1.0 mg/dL   Nitrite POSITIVE (*) NEGATIVE   Leukocytes, UA MODERATE (*) NEGATIVE  URINE MICROSCOPIC-ADD ON      Result Value Range   Squamous Epithelial / LPF RARE  RARE   WBC, UA TOO NUMEROUS TO COUNT  <3 WBC/hpf   RBC / HPF 7-10  <3 RBC/hpf   Bacteria, UA MANY (*) RARE  CBC WITH DIFFERENTIAL      Result Value Range   WBC 10.5  4.0 - 10.5 K/uL   RBC 4.03 (*) 4.22 - 5.81 MIL/uL   Hemoglobin 11.1 (*) 13.0 - 17.0 g/dL   HCT 09.6 (*) 04.5 - 40.9 %   MCV 87.3  78.0 - 100.0 fL   MCH 27.5  26.0 -  34.0 pg   MCHC 31.5  30.0 - 36.0 g/dL   RDW 81.1 (*) 91.4 - 78.2 %   Platelets 219  150 - 400 K/uL   Neutrophils Relative % 62  43 - 77 %   Neutro Abs 6.6  1.7 - 7.7 K/uL   Lymphocytes Relative 20  12 - 46 %   Lymphs Abs 2.1  0.7 - 4.0 K/uL   Monocytes Relative 11  3 - 12 %   Monocytes Absolute 1.1 (*) 0.1 - 1.0 K/uL   Eosinophils Relative 7 (*) 0 - 5 %   Eosinophils Absolute 0.7  0.0 - 0.7 K/uL   Basophils Relative 1  0 - 1 %   Basophils Absolute 0.1  0.0 - 0.1 K/uL  BASIC METABOLIC PANEL      Result Value Range   Sodium 137  135 - 145 mEq/L   Potassium 4.3  3.5 - 5.1 mEq/L   Chloride 104  96 - 112 mEq/L   CO2 29  19 - 32 mEq/L   Glucose, Bld 121 (*) 70 - 99 mg/dL   BUN 19  6 - 23 mg/dL   Creatinine, Ser 9.56  0.Mitchell - 1.35  mg/dL   Calcium 9.3  8.4 - 16.1 mg/dL   GFR calc non Af Amer 54 (*) >90 mL/min   GFR calc Af Amer 63 (*) >90 mL/min   Imaging Review Ct Abdomen Pelvis Wo Contrast  01/08/2013   *RADIOLOGY REPORT*  Clinical Data: Growing pain, nephrolithiasis  CT ABDOMEN AND PELVIS WITHOUT CONTRAST  Technique:  Multidetector CT imaging of the abdomen and pelvis was performed following the standard protocol without intravenous contrast.  Comparison: Prior CT from 12/31/2012  Findings: Visualized lung bases are clear.  3.3 cm cyst within the inferior right liver is unchanged.  Limited noncontrast evaluation the liver is otherwise unremarkable. Gallbladder is normal.  There is no biliary ductal dilatation.  The spleen, adrenal glands, and pancreas demonstrate normal unenhanced appearance.  Nonobstructive 9 mm stone is again seen within the lower pole of the left kidney, unchanged.  There is no left-sided hydronephrosis or hydroureter.  No stones seen along the course of the left renal collecting system. Is identified cystic lesions within the left kidney are not as well seen on today's examination due to lack of IV contrast.  There has been interval worsening of right sided hydronephrosis  and hydroureter as compared to the prior examination.  No obstructive stones are seen along the course of the right renal collecting system. The right ureter is dilated down to the level of the UVJ. Evaluation for possible obstructive masses is limited on this noncontrast examination.  Several punctate nonobstructive stones are again seen within the upper pole of the right kidney, unchanged.  There is right-sided perinephric stranding, slightly increased as compared to prior study.  The right kidney overall appears slightly atrophic as compared to the left.  There is no evidence of bowel obstruction.  Appendix is normal.  Bladder is within normal limits. Multiple surgical clips are again seen within the pelvis, suggestive of prior prostatectomy.  Suture line again noted within the sigmoid colon.  No free air or fluid.  No pathologically enlarged intra-abdominal pelvic lymph nodes.  Prominent atheromatous disease is again seen throughout the intra- abdominal aorta and its branch vessels.  No acute osseous abnormality identified.  IMPRESSION:  1.  Interval worsening of right hydroureteronephrosis as compared to prior CT from 12/31/2012. The etiology for this obstruction is not evident on this noncontrast examination. 2.  Stable bilateral nonobstructive nephrolithiasis.   Original Report Authenticated By: Rise Mu, M.D.   Images viewed by me.  MDM   1. Urinary tract infection   2. Groin pain   3. Hydronephrosis, right    Groin pain of uncertain cause. Urinalysis will be obtained and you'll be sent for CT scan. I reviewed his records on topic and he was just discharged from [facility] after being admitted for chest pain and having a cardiac catheterization which did not show any critical lesions. I do not find any CT scans of his abdomen and pelvis or any ultrasounds of his kidneys noted on topic. However, in review of PAC system, he did have a CT scan of his abdomen and pelvis on September  5 which showed right hydronephrosis and a left renal calculus. Ultrasound is not available tonight, so CT scan will be repeated. He is given a dose of ketorolac for pain.  Following ketorolac, he is resting comfortably. CT scan shows worsening of hydronephrosis the right kidney without obvious cause. Urine has come back showing evidence of UTI and is given a dose of ceftriaxone. It is noted that he is  on an anticoagulant - rivaroxaban - so he is not maintained on NSAIDs. He is discharged with prescription for tramadol for pain and cephalexin for UTI and he is referred to the on-call urologist for evaluation of his hydronephrosis. He is advised to return immediately should he develop a fever which might indicate a closed space infection.  Dione Booze, MD 01/08/13 [phone]

## 2013-01-08 NOTE — ED Notes (Addendum)
Pt c/o groin pain and pain from a "kidney stone." Pt unable to get pain relief. Pt says he had a ct scan at [facility] on or about Sept. 8th.  Pt was given "a pain med that is not working." Pt states he is passing some blood when he urinates.

## 2013-01-08 NOTE — ED Notes (Signed)
Pt sleeping. 

## 2013-01-08 NOTE — ED Notes (Signed)
Called pts wife, she is on her way to pick pt up.

## 2013-01-10 LAB — URINE CULTURE

## 2013-01-11 NOTE — ED Notes (Addendum)
Post ED Visit - Positive Culture Follow-up: Successful Patient Follow-Up  Culture assessed and recommendations reviewed by: []  Wes Dulaney, Pharm.D., BCPS []  Celedonio Miyamoto, Pharm.D., BCPS [x]  Georgina Pillion, [address].D., BCPS []  [place], [address].D., BCPS, AAHIVP []  Estella Husk, Pharm.D., BCPS, AAHIVP  Positive urine -MRSA culture  []  Patient discharged without antimicrobial prescription and treatment is now indicated [x]  Organism is resistant to prescribed ED discharge antimicrobial []  Patient with positive blood cultures  New antibiotic prescription: D/C Keflex >> Fax urine culture results over to the patient's urologist and have them follow-up there or come into the ED for additional evaluation.  ED Provider: Junious Silk, PA-C  Contacted patient: Patient informed to f/u with PCP, date 01/11/2013, time 1529 Copy of labs faxed to [phone] attention Reather Littler 01/11/2013, 3:26 PM

## 2013-01-11 NOTE — Progress Notes (Signed)
ED Antimicrobial Stewardship Positive Culture Follow Up   Mitchell [name] is an 67 y.o. male who presented to [facility] on 01/08/2013 with a chief complaint of groin pain due to kidney stones  Chief Complaint  Patient presents with  . Groin Pain  . Nephrolithiasis    Recent Results (from the past 720 hour(s))  MRSA PCR SCREENING     Status: None   Collection Time    01/03/13  3:47 AM      Result Value Range Status   MRSA by PCR NEGATIVE  NEGATIVE Final   Comment:            The GeneXpert MRSA Assay (FDA     approved for NASAL specimens     only), is one component of a     comprehensive MRSA colonization     surveillance program. It is not     intended to diagnose MRSA     infection nor to guide or     monitor treatment for     MRSA infections.  URINE CULTURE     Status: None   Collection Time    01/03/13  4:21 AM      Result Value Range Status   Specimen Description URINE, CATHETERIZED   Final   Special Requests NONE   Final   Culture  Setup Time     Final   Value: 01/03/2013 05:37     Performed at [facility]   Colony Count     Final   Value: NO GROWTH     Performed at [facility]   Culture     Final   Value: NO GROWTH     Performed at [facility]   Report Status 01/04/2013 FINAL   Final  URINE CULTURE     Status: None   Collection Time    01/08/13  2:53 AM      Result Value Range Status   Specimen Description URINE, CLEAN CATCH   Final   Special Requests NONE   Final   Culture  Setup Time     Final   Value: 01/08/2013 21:50     Performed at [facility] Count     Final   Value: >=100,000 COLONIES/ML     Performed at [facility]   Culture     Final   Value: METHICILLIN RESISTANT STAPHYLOCOCCUS AUREUS     Note: RIFAMPIN AND GENTAMICIN SHOULD NOT BE USED AS SINGLE DRUGS FOR TREATMENT OF STAPH INFECTIONS. CRITICAL RESULT CALLED TO, READ BACK BY AND VERIFIED WITH: SHANNON GAMMONS @ 8:14AM 01/10/13 BY DWEEKS   Performed at [facility]   Report Status 01/10/2013 FINAL   Final   Organism ID, Bacteria METHICILLIN RESISTANT STAPHYLOCOCCUS AUREUS   Final    [x]  Treated with Keflex, organism resistant to prescribed antimicrobial [x]  Requires further evaluation  67 y.o. M recently admitted from 9/8 to 9/10 for evaluation of ACS sx (underwent cardiac cath which showed no critical stenosis) who presented to the [facility] on 9/13 with groin pain due to a kidney stone. CT showed worsening of R hydrouteronephrosis and stable B/L nonobstructive nephrolithiasis.   The patient grew 100k MRSA in his urine culture -- which is not a normal urinary pathogen. 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 The last surgery is noted to be in April '14 and was a transuretheral resection of  the bladder neck, dilation, and foreign body removal. Given the patient's history of bladder surgeries -- there is some concern for an abscess that may be seeding the urine.   New antibiotic prescription: D/c Keflex >> Fax urine culture results over to the patient's urologist and have them follow-up there or come into the ED for additional evaluation.   ED Provider: Junious Silk, PA-C  Rolley Sims 01/11/2013, 11:26 AM Infectious Diseases Pharmacist Phone# [phone]

## 2013-01-20 ENCOUNTER — Emergency Department (HOSPITAL_COMMUNITY): Payer: Medicare Other

## 2013-01-20 ENCOUNTER — Emergency Department (HOSPITAL_COMMUNITY)
Admission: EM | Admit: 2013-01-20 | Discharge: 2013-01-20 | Discharge status: Against Medical Advice | Payer: Medicare Other | Attending: Emergency Medicine | Admitting: Emergency Medicine

## 2013-01-20 ENCOUNTER — Encounter (HOSPITAL_COMMUNITY): Payer: Self-pay

## 2013-01-20 DIAGNOSIS — I129 Hypertensive chronic kidney disease with stage 1 through stage 4 chronic kidney disease, or unspecified chronic kidney disease: Secondary | ICD-10-CM | POA: Insufficient documentation

## 2013-01-20 DIAGNOSIS — Z87442 Personal history of urinary calculi: Secondary | ICD-10-CM | POA: Insufficient documentation

## 2013-01-20 DIAGNOSIS — N189 Chronic kidney disease, unspecified: Secondary | ICD-10-CM | POA: Insufficient documentation

## 2013-01-20 DIAGNOSIS — Z79899 Other long term (current) drug therapy: Secondary | ICD-10-CM | POA: Insufficient documentation

## 2013-01-20 DIAGNOSIS — F17200 Nicotine dependence, unspecified, uncomplicated: Secondary | ICD-10-CM | POA: Insufficient documentation

## 2013-01-20 DIAGNOSIS — Z7982 Long term (current) use of aspirin: Secondary | ICD-10-CM | POA: Insufficient documentation

## 2013-01-20 DIAGNOSIS — Z7901 Long term (current) use of anticoagulants: Secondary | ICD-10-CM | POA: Insufficient documentation

## 2013-01-20 DIAGNOSIS — J441 Chronic obstructive pulmonary disease with (acute) exacerbation: Secondary | ICD-10-CM | POA: Insufficient documentation

## 2013-01-20 DIAGNOSIS — Z8701 Personal history of pneumonia (recurrent): Secondary | ICD-10-CM | POA: Insufficient documentation

## 2013-01-20 DIAGNOSIS — Z856 Personal history of leukemia: Secondary | ICD-10-CM | POA: Insufficient documentation

## 2013-01-20 DIAGNOSIS — F411 Generalized anxiety disorder: Secondary | ICD-10-CM | POA: Insufficient documentation

## 2013-01-20 DIAGNOSIS — I5020 Unspecified systolic (congestive) heart failure: Secondary | ICD-10-CM | POA: Insufficient documentation

## 2013-01-20 DIAGNOSIS — J449 Chronic obstructive pulmonary disease, unspecified: Secondary | ICD-10-CM

## 2013-01-20 DIAGNOSIS — Z794 Long term (current) use of insulin: Secondary | ICD-10-CM | POA: Insufficient documentation

## 2013-01-20 DIAGNOSIS — Z792 Long term (current) use of antibiotics: Secondary | ICD-10-CM | POA: Insufficient documentation

## 2013-01-20 DIAGNOSIS — Z8546 Personal history of malignant neoplasm of prostate: Secondary | ICD-10-CM | POA: Insufficient documentation

## 2013-01-20 DIAGNOSIS — R079 Chest pain, unspecified: Secondary | ICD-10-CM | POA: Insufficient documentation

## 2013-01-20 LAB — CBC WITH DIFFERENTIAL/PLATELET
Basophils Absolute: 0 10*3/uL (ref 0.0–0.1)
Basophils Relative: 0 % (ref 0–1)
Eosinophils Absolute: 0.1 10*3/uL (ref 0.0–0.7)
HCT: 42.3 % (ref 39.0–52.0)
Lymphocytes Relative: 20 % (ref 12–46)
MCH: 27.5 pg (ref 26.0–34.0)
MCHC: 31.4 g/dL (ref 30.0–36.0)
MCV: 87.4 fL (ref 78.0–100.0)
Monocytes Absolute: 0.9 10*3/uL (ref 0.1–1.0)
Monocytes Relative: 8 % (ref 3–12)
Neutro Abs: 7.7 10*3/uL (ref 1.7–7.7)
Neutrophils Relative %: 71 % (ref 43–77)
Platelets: 327 10*3/uL (ref 150–400)
RBC: 4.84 MIL/uL (ref 4.22–5.81)
RDW: 16.3 % — ABNORMAL HIGH (ref 11.5–15.5)

## 2013-01-20 LAB — BASIC METABOLIC PANEL
BUN: 29 mg/dL — ABNORMAL HIGH (ref 6–23)
Chloride: 103 mEq/L (ref 96–112)
Creatinine, Ser: 1.08 mg/dL (ref 0.50–1.35)
GFR calc Af Amer: 80 mL/min — ABNORMAL LOW (ref >90)
GFR calc non Af Amer: 69 mL/min — ABNORMAL LOW (ref >90)
Potassium: 3.9 mEq/L (ref 3.5–5.1)
Sodium: 140 mEq/L (ref 135–145)

## 2013-01-20 LAB — PRO B NATRIURETIC PEPTIDE: Pro B Natriuretic peptide (BNP): 915.3 pg/mL — ABNORMAL HIGH (ref 0–125)

## 2013-01-20 MED ORDER — IPRATROPIUM BROMIDE 0.02 % IN SOLN
0.5000 mg | Freq: Once | RESPIRATORY_TRACT | Status: AC
  Administered 2013-01-20: 0.5 mg via RESPIRATORY_TRACT
  Filled 2013-01-20: qty 2.5

## 2013-01-20 MED ORDER — ALBUTEROL SULFATE (5 MG/ML) 0.5% IN NEBU
5.0000 mg | INHALATION_SOLUTION | Freq: Once | RESPIRATORY_TRACT | Status: AC
  Administered 2013-01-20: 5 mg via RESPIRATORY_TRACT
  Filled 2013-01-20: qty 1

## 2013-01-20 NOTE — ED Provider Notes (Signed)
CSN: [id number]     Arrival date & time 01/20/13  0504 History   None    No chief complaint on file.  (Consider location/radiation/quality/duration/timing/severity/associated sxs/prior Treatment) HPI Comments: She presents to the ER for evaluation of shortness of breath. Patient reports that for the last 2 days he is progressively worsening difficulty breathing. Patient reports that he simply cannot catch his breath. He does indicate that he has diffuse pain across his chest, but that has been present for a long time and seems to be chronic. It has not been changed. Patient reports a cough but is nonproductive. He has not had any fever.   Past Medical History  Diagnosis Date  . COPD (chronic obstructive pulmonary disease)   . Hypertension   . Pneumonia   . Systolic heart failure   . AF (atrial fibrillation) 08/21/2011  . Anxiety   . History of kidney stones   . Cancer   . Prostate cancer   . Leukemia   . Leukemia   . Chronic kidney disease    Past Surgical History  Procedure Laterality Date  . Colon surgery    . Prostate surgery  1999  . Transurethral resection of bladder tumor  08/25/2011    Procedure: TRANSURETHRAL RESECTION OF BLADDER TUMOR (TURBT);  Surgeon: Ky Barban, MD;  Location: [facility];  Service: Urology;  Laterality: N/A;  Transurethral Resection of Bladder Neck  . Cystoscopy with urethral dilatation  12/30/2011    Procedure: CYSTOSCOPY WITH URETHRAL DILATATION;  Surgeon: Ky Barban, MD;  Location: [facility];  Service: Urology;;  . [place]  12/30/2011    Procedure: CYSTOSCOPY/URETHROTOMY;  Surgeon: Ky Barban, MD;  Location: [facility];  Service: Urology;;  . Foreign body removal  12/30/2011    Procedure: FOREIGN BODY REMOVAL;  Surgeon: Ky Barban, MD;  Location: [facility];  Service: Urology;;  Transurethral Removal of 4 clips from bladder neck  . Partial colectomy  2012    [facility]  . Transurethral resection of bladder neck  07/27/2012    Procedure:  TRANSURETHRAL RESECTION OF BLADDER NECK;  Surgeon: Ky Barban, MD;  Location: [facility];  Service: Urology;;  . Cystoscopy with urethral dilatation  07/27/2012    Procedure: CYSTOSCOPY WITH URETHRAL DILATATION;  Surgeon: Ky Barban, MD;  Location: [facility];  Service: Urology;;  . Foreign body removal  07/27/2012    Procedure: FOREIGN BODY REMOVAL-CLIPS BLADDER NECK;  Surgeon: Ky Barban, MD;  Location: [facility];  Service: Urology;;  . Cataract extraction w/phaco Right 10/04/2012    Procedure: CATARACT EXTRACTION PHACO AND INTRAOCULAR LENS PLACEMENT (IOC);  Surgeon: Gemma Payor, MD;  Location: [facility];  Service: Ophthalmology;  Laterality: Right;  CDE:12.16  . Eye surgery    . Cataract extraction w/phaco Left 10/18/2012    Procedure: CATARACT EXTRACTION PHACO AND INTRAOCULAR LENS PLACEMENT (IOC);  Surgeon: Gemma Payor, MD;  Location: [facility];  Service: Ophthalmology;  Laterality: Left;  CDE:14.42   Family History  Problem Relation Age of Onset  . Heart attack Mother   . Cancer Father   . Stroke Father    History  Substance Use Topics  . Smoking status: Current Every Day Smoker -- 0.50 packs/day for 42 years    Types: Cigarettes  . Smokeless tobacco: Former Neurosurgeon  . Alcohol Use: No    Review of Systems  Constitutional: Negative for fever.  Respiratory: Positive for cough and shortness of breath.   Cardiovascular: Positive for chest pain.  All  other systems reviewed and are negative.    Allergies  Review of patient's allergies indicates no known allergies.  Home Medications   Current Outpatient Rx  Name  Route  Sig  Dispense  Refill  . albuterol (PROVENTIL HFA;VENTOLIN HFA) 108 (90 BASE) MCG/ACT inhaler   Inhalation   Inhale 2 puffs into the lungs every 6 (six) hours as needed. For shortness of breath          . ALPRAZolam (XANAX) 1 MG tablet   Oral   Take 1 mg by mouth 3 (three) times daily.         Marland Kitchen aspirin EC 81 MG EC tablet   Oral   Take 1 tablet (81 mg  total) by mouth daily.   30 tablet   3   . atorvastatin (LIPITOR) 20 MG tablet   Oral   Take 1 tablet (20 mg total) by mouth daily at 6 PM.   30 tablet   3   . cephALEXin (KEFLEX) 500 MG capsule   Oral   Take 1 capsule (500 mg total) by mouth 4 (four) times daily.   40 capsule   0   . ciprofloxacin (CIPRO) 500 MG tablet   Oral   Take 1 tablet (500 mg total) by mouth 2 (two) times daily.   10 tablet   0   . citalopram (CELEXA) 40 MG tablet   Oral   Take 0.5 tablets (20 mg total) by mouth daily.   30 tablet   1   . digoxin (LANOXIN) 0.25 MG tablet   Oral   Take 0.25 mg by mouth daily.         Marland Kitchen docusate sodium (COLACE) 100 MG capsule   Oral   Take 100 mg by mouth as needed. For constipation          . Fluticasone-Salmeterol (ADVAIR DISKUS) 250-50 MCG/DOSE AEPB   Inhalation   Inhale 1 puff into the lungs every 12 (twelve) hours.           Marland Kitchen imatinib (GLEEVEC) 100 MG tablet   Oral   Take 200 mg by mouth daily. Take with meals and large glass of water.Caution:Chemotherapy         . ipratropium-albuterol (DUONEB) 0.5-2.5 (3) MG/3ML SOLN   Nebulization   Take 3 mLs by nebulization every 6 (six) hours as needed.         Marland Kitchen lisinopril (PRINIVIL,ZESTRIL) 40 MG tablet   Oral   Take 40 mg by mouth daily.         . metoprolol succinate (TOPROL-XL) 100 MG 24 hr tablet   Oral   Take 1 tablet (100 mg total) by mouth daily. Take with or immediately following a meal.   30 tablet   3   . mometasone (ELOCON) 0.1 % cream   Topical   Apply 1 application topically daily as needed. Skin lesions          . polyethylene glycol powder (GLYCOLAX/MIRALAX) powder   Oral   Take 17 g by mouth daily as needed.          . Rivaroxaban (XARELTO) 20 MG TABS   Oral   Take 1 tablet by mouth daily.         Marland Kitchen spironolactone (ALDACTONE) 25 MG tablet   Oral   Take 1 tablet (25 mg total) by mouth 2 (two) times daily.   60 tablet   3   . traMADol (ULTRAM) 50 MG  tablet  Oral   Take 1 tablet (50 mg total) by mouth every 6 (six) hours as needed for pain.   15 tablet   0   . venlafaxine (EFFEXOR) 75 MG tablet   Oral   Take 75 mg by mouth 2 (two) times daily.          There were no vitals taken for this visit. Physical Exam  Constitutional: He is oriented to person, place, and time. He appears well-developed and well-nourished. No distress.  HENT:  Head: Normocephalic and atraumatic.  Right Ear: Hearing normal.  Left Ear: Hearing normal.  Nose: Nose normal.  Mouth/Throat: Oropharynx is clear and moist and mucous membranes are normal.  Eyes: Conjunctivae and EOM are normal. Pupils are equal, round, and reactive to light.  Neck: Normal range of motion. Neck supple.  Cardiovascular: Regular rhythm, S1 normal and S2 normal.  Exam reveals no gallop and no friction rub.   No murmur heard. Pulmonary/Chest: Effort normal. No respiratory distress. He has decreased breath sounds. He exhibits no tenderness.  Abdominal: Soft. Normal appearance and bowel sounds are normal. There is no hepatosplenomegaly. There is no tenderness. There is no rebound, no guarding, no tenderness at McBurney's point and negative Murphy's sign. No hernia.  Musculoskeletal: Normal range of motion.  Neurological: He is alert and oriented to person, place, and time. He has normal strength. No cranial nerve deficit or sensory deficit. Coordination normal. GCS eye subscore is 4. GCS verbal subscore is 5. GCS motor subscore is 6.  Skin: Skin is warm, dry and intact. No rash noted. No cyanosis.  Psychiatric: He has a normal mood and affect. His speech is normal and behavior is normal. Thought content normal.    ED Course  Procedures (including critical care time)  EKG:  Date: 01/20/2013  Rate: 79  Rhythm: atrial fibrillation  QRS Axis: right  Intervals: normal  ST/T Wave abnormalities: nonspecific ST/T changes  Conduction Disutrbances:nonspecific intraventricular conduction  delay  Narrative Interpretation:   Old EKG Reviewed: unchanged    Labs Review Labs Reviewed  CBC WITH DIFFERENTIAL  BASIC METABOLIC PANEL  PRO B NATRIURETIC PEPTIDE  TROPONIN I   Imaging Review Dg Chest 2 View  01/20/2013   CLINICAL DATA:  Shortness of breath. History of COPD.  EXAM: CHEST  2 VIEW  COMPARISON:  Single view of the chest 01/11/2003. CT chest 08/12/2011.  FINDINGS: There is cardiomegaly without edema. Lungs are clear. The chest appears hyperexpanded. No pneumothorax or pleural fluid.  IMPRESSION: Cardiomegaly and findings compatible with emphysema. No acute abnormality.   Electronically Signed   By: Drusilla Kanner M.D.   On: 01/20/2013 06:07    MDM  Diagnosis: COPD  Patient presents to the ER for evaluation of shortness of breath. Patient reports that he has been feeling short of breath for several days. He has a history of COPD. Patient will have minimal air movement i which improved with albuterol.  She also indicates that he is experiencing chest pain. Review of his records reveals that he had a cardiac catheterization 2 weeks ago and was told showed minimal coronary artery disease. Chest pain is therefore not concerning for cardiac etiology.  Patient also indicates to me that he is experiencing double vision. He reports that if he closes one eye he can see fine out of the other, but when he opens both eyes vision is blurry and double. This is consistent with binocular double vision. I cannot see any extraocular motion deficit or disconjugate gaze on  exam. Patient does have a history of multiple cancers. This is concerning for possible brain lesion. Recommend an MRI to the patient, but he declines. He reports that he has a followup appointment at [facility] for his cancer is treated this week and would prefer to follow up with them. I did share with him my concerns about the possibility of brain lesion including stroke and tumor, he understands but declines further  testing.  At this point, patient tells me that he is feeling better with his breathing. He does appear to have a much improved respiratory rate. He does not wish to stay in the yard it longer. I discussed with him that he is experiencing chest pain he was very short of breath arrival. It requires further workup with possibly hospitalization. Patient understands this, but declines. He is awake, alert and oriented. He appears to have capacity to make decisions. I did ask him to stay for further treatment, but he once again declined. The patient was therefore discharged AGAINST MEDICAL ADVICE.   Gilda Crease, MD 01/20/13 [phone]

## 2013-01-20 NOTE — ED Notes (Signed)
Pt states sob worsening for several days, states chest pain all day also.

## 2013-02-18 ENCOUNTER — Inpatient Hospital Stay (HOSPITAL_COMMUNITY)
Admission: EM | Admit: 2013-02-18 | Discharge: 2013-02-22 | DRG: 313 | Disposition: A | Discharge status: Home / Self Care | Payer: Medicare Other | Attending: Cardiovascular Disease | Admitting: Cardiovascular Disease

## 2013-02-18 ENCOUNTER — Emergency Department (HOSPITAL_COMMUNITY): Payer: Medicare Other

## 2013-02-18 ENCOUNTER — Encounter (HOSPITAL_COMMUNITY): Payer: Self-pay | Admitting: Emergency Medicine

## 2013-02-18 DIAGNOSIS — F3289 Other specified depressive episodes: Secondary | ICD-10-CM | POA: Diagnosis present

## 2013-02-18 DIAGNOSIS — F17200 Nicotine dependence, unspecified, uncomplicated: Secondary | ICD-10-CM | POA: Diagnosis present

## 2013-02-18 DIAGNOSIS — I509 Heart failure, unspecified: Secondary | ICD-10-CM | POA: Diagnosis present

## 2013-02-18 DIAGNOSIS — E669 Obesity, unspecified: Secondary | ICD-10-CM | POA: Diagnosis present

## 2013-02-18 DIAGNOSIS — B952 Enterococcus as the cause of diseases classified elsewhere: Secondary | ICD-10-CM | POA: Diagnosis present

## 2013-02-18 DIAGNOSIS — N189 Chronic kidney disease, unspecified: Secondary | ICD-10-CM | POA: Diagnosis present

## 2013-02-18 DIAGNOSIS — F05 Delirium due to known physiological condition: Secondary | ICD-10-CM | POA: Diagnosis present

## 2013-02-18 DIAGNOSIS — Z8546 Personal history of malignant neoplasm of prostate: Secondary | ICD-10-CM

## 2013-02-18 DIAGNOSIS — I5022 Chronic systolic (congestive) heart failure: Secondary | ICD-10-CM | POA: Diagnosis present

## 2013-02-18 DIAGNOSIS — J4489 Other specified chronic obstructive pulmonary disease: Secondary | ICD-10-CM | POA: Diagnosis present

## 2013-02-18 DIAGNOSIS — I10 Essential (primary) hypertension: Secondary | ICD-10-CM | POA: Diagnosis present

## 2013-02-18 DIAGNOSIS — R079 Chest pain, unspecified: Principal | ICD-10-CM | POA: Diagnosis present

## 2013-02-18 DIAGNOSIS — I129 Hypertensive chronic kidney disease with stage 1 through stage 4 chronic kidney disease, or unspecified chronic kidney disease: Secondary | ICD-10-CM | POA: Diagnosis present

## 2013-02-18 DIAGNOSIS — K589 Irritable bowel syndrome without diarrhea: Secondary | ICD-10-CM | POA: Diagnosis present

## 2013-02-18 DIAGNOSIS — N320 Bladder-neck obstruction: Secondary | ICD-10-CM | POA: Diagnosis present

## 2013-02-18 DIAGNOSIS — Z79899 Other long term (current) drug therapy: Secondary | ICD-10-CM

## 2013-02-18 DIAGNOSIS — Z23 Encounter for immunization: Secondary | ICD-10-CM

## 2013-02-18 DIAGNOSIS — R32 Unspecified urinary incontinence: Secondary | ICD-10-CM | POA: Diagnosis present

## 2013-02-18 DIAGNOSIS — Z7901 Long term (current) use of anticoagulants: Secondary | ICD-10-CM

## 2013-02-18 DIAGNOSIS — I2510 Atherosclerotic heart disease of native coronary artery without angina pectoris: Secondary | ICD-10-CM | POA: Diagnosis present

## 2013-02-18 DIAGNOSIS — Z6826 Body mass index (BMI) 26.0-26.9, adult: Secondary | ICD-10-CM

## 2013-02-18 DIAGNOSIS — F329 Major depressive disorder, single episode, unspecified: Secondary | ICD-10-CM | POA: Diagnosis present

## 2013-02-18 DIAGNOSIS — J449 Chronic obstructive pulmonary disease, unspecified: Secondary | ICD-10-CM | POA: Diagnosis present

## 2013-02-18 DIAGNOSIS — C9110 Chronic lymphocytic leukemia of B-cell type not having achieved remission: Secondary | ICD-10-CM | POA: Diagnosis present

## 2013-02-18 DIAGNOSIS — I4891 Unspecified atrial fibrillation: Secondary | ICD-10-CM | POA: Diagnosis present

## 2013-02-18 DIAGNOSIS — N390 Urinary tract infection, site not specified: Secondary | ICD-10-CM | POA: Diagnosis present

## 2013-02-18 DIAGNOSIS — Z7982 Long term (current) use of aspirin: Secondary | ICD-10-CM

## 2013-02-18 DIAGNOSIS — F411 Generalized anxiety disorder: Secondary | ICD-10-CM | POA: Diagnosis present

## 2013-02-18 LAB — COMPREHENSIVE METABOLIC PANEL
ALT: 16 U/L (ref 0–53)
Albumin: 4.3 g/dL (ref 3.5–5.2)
BUN: 28 mg/dL — ABNORMAL HIGH (ref 6–23)
Calcium: 9.6 mg/dL (ref 8.4–10.5)
Creatinine, Ser: 1.21 mg/dL (ref 0.50–1.35)
GFR calc Af Amer: 70 mL/min — ABNORMAL LOW (ref >90)
Glucose, Bld: 124 mg/dL — ABNORMAL HIGH (ref 70–99)
Total Protein: 7.9 g/dL (ref 6.0–8.3)

## 2013-02-18 LAB — PROTIME-INR
INR: 1.13 (ref 0.00–1.49)
Prothrombin Time: 14.3 seconds (ref 11.6–15.2)

## 2013-02-18 LAB — POCT I-STAT TROPONIN I: Troponin i, poc: 0.02 ng/mL (ref 0.00–0.08)

## 2013-02-18 LAB — CBC
HCT: 42.6 % (ref 39.0–52.0)
Hemoglobin: 13.4 g/dL (ref 13.0–17.0)
MCH: 27.6 pg (ref 26.0–34.0)
MCHC: 31.5 g/dL (ref 30.0–36.0)
MCV: 87.8 fL (ref 78.0–100.0)
Platelets: 294 10*3/uL (ref 150–400)
RDW: 15.1 % (ref 11.5–15.5)

## 2013-02-18 LAB — APTT: aPTT: 32 seconds (ref 24–37)

## 2013-02-18 NOTE — ED Notes (Signed)
[facility] presents with a 67 yo male from home with chest pain and SOB since this morning around 11 am.  Previous cardiac hx.  Pt c/o of intermittent substernal CP with no radiation since this morning.  Pt c/o SOB for 1 week with progressive worsening of symptoms.  [facility] ECG showed pt in Afib with RBB.  No ST elevation with wide complex.  Pt was given 2 SL NTG and 324 ASA with 4 mg of Zofran en route.  Nausea, no vomiting and diaphoresis all day.  Currently at 5 out of 10 pain/ originally 8-9 of 10 pain on scene.

## 2013-02-18 NOTE — ED Notes (Signed)
Pt denies nausea.

## 2013-02-18 NOTE — ED Provider Notes (Signed)
CSN: [id number]     Arrival date & time 02/18/13  2223 History   First MD Initiated Contact with Patient 02/18/13 2303     Chief Complaint  Patient presents with  . Chest Pain   (Consider location/radiation/quality/duration/timing/severity/associated sxs/prior Treatment) Patient is a 67 y.o. male presenting with chest pain. The history is provided by the patient and the EMS personnel. No language interpreter was used.  Chest Pain Pain location:  Substernal area Pain radiates to:  Does not radiate Pain radiates to the back: no   Pain severity:  Moderate Onset quality:  Gradual Duration:  12 hours Timing:  Intermittent Progression:  Waxing and waning Associated symptoms: cough, diaphoresis, nausea and shortness of breath    History from patient differs from EMS and initial nursing assessment.  Patient denies to me that he is having chest pain.  When I ask him to point to me where he is having pain, he points to his left hip/flank area.   Past Medical History  Diagnosis Date  . COPD (chronic obstructive pulmonary disease)   . Hypertension   . Pneumonia   . Systolic heart failure   . AF (atrial fibrillation) 08/21/2011  . Anxiety   . History of kidney stones   . Cancer   . Prostate cancer   . Leukemia   . Leukemia   . Chronic kidney disease    Past Surgical History  Procedure Laterality Date  . Colon surgery    . Prostate surgery  1999  . Transurethral resection of bladder tumor  08/25/2011    Procedure: TRANSURETHRAL RESECTION OF BLADDER TUMOR (TURBT);  Surgeon: Ky Barban, MD;  Location: [facility];  Service: Urology;  Laterality: N/A;  Transurethral Resection of Bladder Neck  . Cystoscopy with urethral dilatation  12/30/2011    Procedure: CYSTOSCOPY WITH URETHRAL DILATATION;  Surgeon: Ky Barban, MD;  Location: [facility];  Service: Urology;;  . [place]  12/30/2011    Procedure: CYSTOSCOPY/URETHROTOMY;  Surgeon: Ky Barban, MD;  Location: [facility];  Service:  Urology;;  . Foreign body removal  12/30/2011    Procedure: FOREIGN BODY REMOVAL;  Surgeon: Ky Barban, MD;  Location: [facility];  Service: Urology;;  Transurethral Removal of 4 clips from bladder neck  . Partial colectomy  2012    [facility]  . Transurethral resection of bladder neck  07/27/2012    Procedure: TRANSURETHRAL RESECTION OF BLADDER NECK;  Surgeon: Ky Barban, MD;  Location: [facility];  Service: Urology;;  . Cystoscopy with urethral dilatation  07/27/2012    Procedure: CYSTOSCOPY WITH URETHRAL DILATATION;  Surgeon: Ky Barban, MD;  Location: [facility];  Service: Urology;;  . Foreign body removal  07/27/2012    Procedure: FOREIGN BODY REMOVAL-CLIPS BLADDER NECK;  Surgeon: Ky Barban, MD;  Location: [facility];  Service: Urology;;  . Cataract extraction w/phaco Right 10/04/2012    Procedure: CATARACT EXTRACTION PHACO AND INTRAOCULAR LENS PLACEMENT (IOC);  Surgeon: Gemma Payor, MD;  Location: [facility];  Service: Ophthalmology;  Laterality: Right;  CDE:12.16  . Eye surgery    . Cataract extraction w/phaco Left 10/18/2012    Procedure: CATARACT EXTRACTION PHACO AND INTRAOCULAR LENS PLACEMENT (IOC);  Surgeon: Gemma Payor, MD;  Location: [facility];  Service: Ophthalmology;  Laterality: Left;  CDE:14.42   Family History  Problem Relation Age of Onset  . Heart attack Mother   . Cancer Father   . Stroke Father    History  Substance Use Topics  .  Smoking status: Current Every Day Smoker -- 0.50 packs/day for 42 years    Types: Cigarettes  . Smokeless tobacco: Former Neurosurgeon  . Alcohol Use: No    Review of Systems  Constitutional: Positive for diaphoresis.  Respiratory: Positive for cough and shortness of breath.   Cardiovascular: Positive for chest pain.  Gastrointestinal: Positive for nausea.  All other systems reviewed and are negative.    Allergies  Review of patient's allergies indicates no known allergies.  Home Medications   Current Outpatient Rx  Name  Route  Sig   Dispense  Refill  . albuterol (PROVENTIL HFA;VENTOLIN HFA) 108 (90 BASE) MCG/ACT inhaler   Inhalation   Inhale 2 puffs into the lungs every 6 (six) hours as needed. For shortness of breath          . ALPRAZolam (XANAX) 1 MG tablet   Oral   Take 1 mg by mouth 3 (three) times daily.         Marland Kitchen aspirin EC 81 MG EC tablet   Oral   Take 1 tablet (81 mg total) by mouth daily.   30 tablet   3   . atorvastatin (LIPITOR) 20 MG tablet   Oral   Take 1 tablet (20 mg total) by mouth daily at 6 PM.   30 tablet   3   . cephALEXin (KEFLEX) 500 MG capsule   Oral   Take 1 capsule (500 mg total) by mouth 4 (four) times daily.   40 capsule   0   . ciprofloxacin (CIPRO) 500 MG tablet   Oral   Take 1 tablet (500 mg total) by mouth 2 (two) times daily.   10 tablet   0   . citalopram (CELEXA) 40 MG tablet   Oral   Take 0.5 tablets (20 mg total) by mouth daily.   30 tablet   1   . digoxin (LANOXIN) 0.25 MG tablet   Oral   Take 0.25 mg by mouth daily.         Marland Kitchen diltiazem (CARDIZEM CD) 180 MG 24 hr capsule               . docusate sodium (COLACE) 100 MG capsule   Oral   Take 100 mg by mouth as needed. For constipation          . doxycycline (VIBRA-TABS) 100 MG tablet               . Fluticasone-Salmeterol (ADVAIR DISKUS) 250-50 MCG/DOSE AEPB   Inhalation   Inhale 1 puff into the lungs every 12 (twelve) hours.           . furosemide (LASIX) 40 MG tablet               . imatinib (GLEEVEC) 100 MG tablet   Oral   Take 200 mg by mouth daily. Take with meals and large glass of water.Caution:Chemotherapy         . ipratropium-albuterol (DUONEB) 0.5-2.5 (3) MG/3ML SOLN   Nebulization   Take 3 mLs by nebulization every 6 (six) hours as needed.         Marland Kitchen lisinopril (PRINIVIL,ZESTRIL) 40 MG tablet   Oral   Take 40 mg by mouth daily.         . metoprolol (LOPRESSOR) 50 MG tablet               . metoprolol succinate (TOPROL-XL) 100 MG 24 hr tablet    Oral   Take 1  tablet (100 mg total) by mouth daily. Take with or immediately following a meal.   30 tablet   3   . mometasone (ELOCON) 0.1 % cream   Topical   Apply 1 application topically daily as needed. Skin lesions          . polyethylene glycol powder (GLYCOLAX/MIRALAX) powder   Oral   Take 17 g by mouth daily as needed.          . potassium chloride (K-DUR,KLOR-CON) 10 MEQ tablet               . predniSONE (DELTASONE) 20 MG tablet               . Rivaroxaban (XARELTO) 20 MG TABS   Oral   Take 1 tablet by mouth daily.         Marland Kitchen spironolactone (ALDACTONE) 25 MG tablet   Oral   Take 1 tablet (25 mg total) by mouth 2 (two) times daily.   60 tablet   3   . traMADol (ULTRAM) 50 MG tablet   Oral   Take 1 tablet (50 mg total) by mouth every 6 (six) hours as needed for pain.   15 tablet   0   . venlafaxine (EFFEXOR) 75 MG tablet   Oral   Take 75 mg by mouth 2 (two) times daily.         Marland Kitchen zolpidem (AMBIEN) 10 MG tablet                BP 106/70  Pulse 72  Temp(Src) 97.8 F (36.6 C) (Oral)  Resp 23  SpO2 97% Physical Exam  Nursing note and vitals reviewed. Constitutional: He appears well-developed.  HENT:  Head: Normocephalic.  Eyes: Pupils are equal, round, and reactive to light.  Neck: No JVD present.  Cardiovascular: Normal rate, normal heart sounds and intact distal pulses.  An irregular rhythm present.  Pulmonary/Chest: He is in respiratory distress. He has rales.  Abdominal: Soft.  Musculoskeletal: He exhibits no edema and no tenderness.  Neurological: He is alert.  Skin: Skin is warm and dry.    ED Course  Procedures (including critical care time) Labs Review Labs Reviewed  URINALYSIS, ROUTINE W REFLEX MICROSCOPIC  CBC  PRO B NATRIURETIC PEPTIDE  COMPREHENSIVE METABOLIC PANEL  APTT  PROTIME-INR   Imaging Review No results found.  EKG Interpretation   None      Date: 02/18/2013  Rate: 77  Rhythm: atrial  fibrillation  QRS Axis: normal  Intervals: fib/flutter waves, prolonged QRSD  ST/T Wave abnormalities: nonspecific ST changes  Conduction Disutrbances:right bundle branch block  Narrative Interpretation:  Atrial fibrillation  Old EKG Reviewed: unchanged  Patient signed out to Dr. Dierdre Highman at shift change.  MDM  Chest pain. Dyspnea.    Jimmye Norman, NP 02/19/13 [phone]

## 2013-02-19 ENCOUNTER — Emergency Department (HOSPITAL_COMMUNITY): Payer: Medicare Other

## 2013-02-19 DIAGNOSIS — I4891 Unspecified atrial fibrillation: Secondary | ICD-10-CM

## 2013-02-19 DIAGNOSIS — J449 Chronic obstructive pulmonary disease, unspecified: Secondary | ICD-10-CM

## 2013-02-19 DIAGNOSIS — R079 Chest pain, unspecified: Principal | ICD-10-CM

## 2013-02-19 DIAGNOSIS — I10 Essential (primary) hypertension: Secondary | ICD-10-CM

## 2013-02-19 DIAGNOSIS — I5022 Chronic systolic (congestive) heart failure: Secondary | ICD-10-CM

## 2013-02-19 DIAGNOSIS — N390 Urinary tract infection, site not specified: Secondary | ICD-10-CM

## 2013-02-19 DIAGNOSIS — C9110 Chronic lymphocytic leukemia of B-cell type not having achieved remission: Secondary | ICD-10-CM

## 2013-02-19 DIAGNOSIS — F05 Delirium due to known physiological condition: Secondary | ICD-10-CM | POA: Diagnosis present

## 2013-02-19 LAB — BLOOD GAS, ARTERIAL
Bicarbonate: 23.5 mEq/L (ref 20.0–24.0)
TCO2: 24.8 mmol/L (ref 0–100)
pCO2 arterial: 43.5 mmHg (ref 35.0–45.0)
pH, Arterial: 7.352 (ref 7.350–7.450)
pO2, Arterial: 87.7 mmHg (ref 80.0–100.0)

## 2013-02-19 LAB — URINALYSIS, ROUTINE W REFLEX MICROSCOPIC
Bilirubin Urine: NEGATIVE
Glucose, UA: NEGATIVE mg/dL
Ketones, ur: NEGATIVE mg/dL
Protein, ur: 100 mg/dL — AB
Urobilinogen, UA: 0.2 mg/dL (ref 0.0–1.0)

## 2013-02-19 LAB — BASIC METABOLIC PANEL
BUN: 32 mg/dL — ABNORMAL HIGH (ref 6–23)
CO2: 25 mEq/L (ref 19–32)
Chloride: 103 mEq/L (ref 96–112)
GFR calc Af Amer: 73 mL/min — ABNORMAL LOW (ref >90)
GFR calc non Af Amer: 63 mL/min — ABNORMAL LOW (ref >90)
Glucose, Bld: 123 mg/dL — ABNORMAL HIGH (ref 70–99)
Potassium: 4.3 mEq/L (ref 3.5–5.1)
Sodium: 139 mEq/L (ref 135–145)

## 2013-02-19 LAB — CBC
HCT: 41.2 % (ref 39.0–52.0)
Hemoglobin: 13.2 g/dL (ref 13.0–17.0)
MCH: 28.3 pg (ref 26.0–34.0)
MCHC: 32 g/dL (ref 30.0–36.0)
MCV: 88.4 fL (ref 78.0–100.0)
RBC: 4.66 MIL/uL (ref 4.22–5.81)

## 2013-02-19 LAB — URINE MICROSCOPIC-ADD ON

## 2013-02-19 LAB — TROPONIN I: Troponin I: <0.3 ng/mL (ref <0.30)

## 2013-02-19 MED ORDER — VENLAFAXINE HCL 75 MG PO TABS
75.0000 mg | ORAL_TABLET | Freq: Two times a day (BID) | ORAL | Status: DC
  Administered 2013-02-19 – 2013-02-21 (×6): 75 mg via ORAL
  Filled 2013-02-19 (×8): qty 1

## 2013-02-19 MED ORDER — FUROSEMIDE 40 MG PO TABS
40.0000 mg | ORAL_TABLET | Freq: Every day | ORAL | Status: DC
  Administered 2013-02-19 – 2013-02-21 (×3): 40 mg via ORAL
  Filled 2013-02-19 (×4): qty 1

## 2013-02-19 MED ORDER — ATORVASTATIN CALCIUM 20 MG PO TABS
20.0000 mg | ORAL_TABLET | Freq: Every day | ORAL | Status: DC
  Administered 2013-02-19 – 2013-02-21 (×3): 20 mg via ORAL
  Filled 2013-02-19 (×4): qty 1

## 2013-02-19 MED ORDER — ONDANSETRON HCL 4 MG PO TABS: 4.0000 mg | ORAL_TABLET | Freq: Four times a day (QID) | ORAL | Status: DC | PRN

## 2013-02-19 MED ORDER — HEPARIN SODIUM (PORCINE) 5000 UNIT/ML IJ SOLN
5000.0000 [IU] | Freq: Three times a day (TID) | INTRAMUSCULAR | Status: DC
  Administered 2013-02-19 – 2013-02-21 (×6): 5000 [IU] via SUBCUTANEOUS
  Filled 2013-02-19 (×9): qty 1

## 2013-02-19 MED ORDER — IPRATROPIUM BROMIDE 0.02 % IN SOLN: 0.5000 mg | Freq: Four times a day (QID) | RESPIRATORY_TRACT | Status: DC | PRN

## 2013-02-19 MED ORDER — POTASSIUM CHLORIDE CRYS ER 10 MEQ PO TBCR
10.0000 meq | EXTENDED_RELEASE_TABLET | Freq: Every day | ORAL | Status: DC
  Administered 2013-02-19 – 2013-02-21 (×3): 10 meq via ORAL
  Filled 2013-02-19 (×4): qty 1

## 2013-02-19 MED ORDER — ONDANSETRON HCL 4 MG/2ML IJ SOLN
4.0000 mg | Freq: Four times a day (QID) | INTRAMUSCULAR | Status: DC | PRN
  Administered 2013-02-20: 4 mg via INTRAVENOUS
  Filled 2013-02-19: qty 2

## 2013-02-19 MED ORDER — INFLUENZA VAC SPLIT QUAD 0.5 ML IM SUSP
0.5000 mL | INTRAMUSCULAR | Status: AC
  Administered 2013-02-20: 0.5 mL via INTRAMUSCULAR
  Filled 2013-02-19: qty 0.5

## 2013-02-19 MED ORDER — HYDROMORPHONE HCL PF 1 MG/ML IJ SOLN
0.5000 mg | INTRAMUSCULAR | Status: DC | PRN
  Administered 2013-02-20: 1 mg via INTRAVENOUS
  Filled 2013-02-19: qty 1

## 2013-02-19 MED ORDER — POLYETHYLENE GLYCOL 3350 17 GM/SCOOP PO POWD: 17.0000 g | Freq: Every day | ORAL | Status: DC | PRN

## 2013-02-19 MED ORDER — ALBUTEROL SULFATE (5 MG/ML) 0.5% IN NEBU: 2.5000 mg | INHALATION_SOLUTION | Freq: Four times a day (QID) | RESPIRATORY_TRACT | Status: DC | PRN

## 2013-02-19 MED ORDER — DIGOXIN 250 MCG PO TABS
0.2500 mg | ORAL_TABLET | Freq: Every day | ORAL | Status: DC
  Administered 2013-02-19 – 2013-02-21 (×3): 0.25 mg via ORAL
  Filled 2013-02-19 (×4): qty 1

## 2013-02-19 MED ORDER — IPRATROPIUM-ALBUTEROL 0.5-2.5 (3) MG/3ML IN SOLN: 3.0000 mL | Freq: Four times a day (QID) | RESPIRATORY_TRACT | Status: DC | PRN

## 2013-02-19 MED ORDER — RIVAROXABAN 20 MG PO TABS
20.0000 mg | ORAL_TABLET | Freq: Every day | ORAL | Status: DC
  Filled 2013-02-19: qty 1

## 2013-02-19 MED ORDER — FUROSEMIDE 10 MG/ML IJ SOLN
20.0000 mg | Freq: Once | INTRAMUSCULAR | Status: AC
  Administered 2013-02-19: 20 mg via INTRAVENOUS

## 2013-02-19 MED ORDER — ZOLPIDEM TARTRATE 5 MG PO TABS: 5.0000 mg | ORAL_TABLET | Freq: Every evening | ORAL | Status: DC | PRN

## 2013-02-19 MED ORDER — ALUM & MAG HYDROXIDE-SIMETH 200-200-20 MG/5ML PO SUSP
30.0000 mL | Freq: Four times a day (QID) | ORAL | Status: DC | PRN
  Administered 2013-02-20 – 2013-02-21 (×3): 30 mL via ORAL
  Filled 2013-02-19 (×4): qty 30

## 2013-02-19 MED ORDER — OXYCODONE HCL 5 MG PO TABS
5.0000 mg | ORAL_TABLET | ORAL | Status: DC | PRN
  Administered 2013-02-19: 5 mg via ORAL
  Filled 2013-02-19: qty 1

## 2013-02-19 MED ORDER — SODIUM CHLORIDE 0.9 % IJ SOLN
3.0000 mL | Freq: Two times a day (BID) | INTRAMUSCULAR | Status: DC
  Administered 2013-02-19 – 2013-02-21 (×4): 3 mL via INTRAVENOUS

## 2013-02-19 MED ORDER — ALPRAZOLAM 0.5 MG PO TABS
1.0000 mg | ORAL_TABLET | Freq: Three times a day (TID) | ORAL | Status: DC
  Administered 2013-02-19 – 2013-02-21 (×7): 1 mg via ORAL
  Filled 2013-02-19 (×8): qty 2

## 2013-02-19 MED ORDER — ACETAMINOPHEN 650 MG RE SUPP: 650.0000 mg | Freq: Four times a day (QID) | RECTAL | Status: DC | PRN

## 2013-02-19 MED ORDER — LISINOPRIL 40 MG PO TABS
40.0000 mg | ORAL_TABLET | Freq: Every day | ORAL | Status: DC
  Administered 2013-02-19 – 2013-02-21 (×3): 40 mg via ORAL
  Filled 2013-02-19 (×4): qty 1

## 2013-02-19 MED ORDER — SPIRONOLACTONE 25 MG PO TABS
25.0000 mg | ORAL_TABLET | Freq: Two times a day (BID) | ORAL | Status: DC
  Administered 2013-02-19 – 2013-02-22 (×7): 25 mg via ORAL
  Filled 2013-02-19 (×10): qty 1

## 2013-02-19 MED ORDER — PREDNISONE 20 MG PO TABS
20.0000 mg | ORAL_TABLET | Freq: Every day | ORAL | Status: DC
  Administered 2013-02-19 – 2013-02-22 (×4): 20 mg via ORAL
  Filled 2013-02-19 (×6): qty 1

## 2013-02-19 MED ORDER — POLYETHYLENE GLYCOL 3350 17 G PO PACK
17.0000 g | PACK | Freq: Every day | ORAL | Status: DC | PRN
  Filled 2013-02-19: qty 1

## 2013-02-19 MED ORDER — ASPIRIN EC 81 MG PO TBEC
81.0000 mg | DELAYED_RELEASE_TABLET | Freq: Every day | ORAL | Status: DC
  Administered 2013-02-19 – 2013-02-21 (×3): 81 mg via ORAL
  Filled 2013-02-19 (×4): qty 1

## 2013-02-19 MED ORDER — SODIUM CHLORIDE 0.9 % IJ SOLN
3.0000 mL | Freq: Two times a day (BID) | INTRAMUSCULAR | Status: DC
  Administered 2013-02-19 (×2): 3 mL via INTRAVENOUS

## 2013-02-19 MED ORDER — DOCUSATE SODIUM 100 MG PO CAPS
100.0000 mg | ORAL_CAPSULE | Freq: Two times a day (BID) | ORAL | Status: DC
  Administered 2013-02-19 – 2013-02-21 (×4): 100 mg via ORAL
  Filled 2013-02-19 (×8): qty 1

## 2013-02-19 MED ORDER — METOPROLOL TARTRATE 50 MG PO TABS
50.0000 mg | ORAL_TABLET | Freq: Two times a day (BID) | ORAL | Status: DC
  Administered 2013-02-19 – 2013-02-21 (×5): 50 mg via ORAL
  Filled 2013-02-19 (×6): qty 1

## 2013-02-19 MED ORDER — FUROSEMIDE 10 MG/ML IJ SOLN
INTRAMUSCULAR | Status: AC
  Administered 2013-02-19: 20 mg via INTRAVENOUS
  Filled 2013-02-19: qty 4

## 2013-02-19 MED ORDER — MOMETASONE FURO-FORMOTEROL FUM 100-5 MCG/ACT IN AERO
2.0000 | INHALATION_SPRAY | Freq: Two times a day (BID) | RESPIRATORY_TRACT | Status: DC
  Administered 2013-02-19 – 2013-02-22 (×7): 2 via RESPIRATORY_TRACT
  Filled 2013-02-19: qty 8.8

## 2013-02-19 MED ORDER — CEFTRIAXONE SODIUM 1 G IJ SOLR
1.0000 g | Freq: Once | INTRAMUSCULAR | Status: AC
  Administered 2013-02-19: 1 g via INTRAVENOUS
  Filled 2013-02-19: qty 10

## 2013-02-19 MED ORDER — SODIUM CHLORIDE 0.9 % IV SOLN: 250.0000 mL | INTRAVENOUS | Status: DC | PRN

## 2013-02-19 MED ORDER — DILTIAZEM HCL ER COATED BEADS 180 MG PO CP24
180.0000 mg | ORAL_CAPSULE | Freq: Every day | ORAL | Status: DC
  Administered 2013-02-19 – 2013-02-21 (×3): 180 mg via ORAL
  Filled 2013-02-19 (×3): qty 1

## 2013-02-19 MED ORDER — CITALOPRAM HYDROBROMIDE 20 MG PO TABS
20.0000 mg | ORAL_TABLET | Freq: Every day | ORAL | Status: DC
  Administered 2013-02-19 – 2013-02-21 (×3): 20 mg via ORAL
  Filled 2013-02-19 (×4): qty 1

## 2013-02-19 MED ORDER — SODIUM CHLORIDE 0.9 % IJ SOLN: 3.0000 mL | INTRAMUSCULAR | Status: DC | PRN

## 2013-02-19 MED ORDER — ACETAMINOPHEN 325 MG PO TABS
650.0000 mg | ORAL_TABLET | Freq: Four times a day (QID) | ORAL | Status: DC | PRN
  Administered 2013-02-21: 650 mg via ORAL
  Filled 2013-02-19: qty 2

## 2013-02-19 NOTE — ED Provider Notes (Signed)
History per EMS - call out for complaints of chest pain and shortness of breath, has history of A. Fib, chest pain resolved prior to arrival. Patient relates some persistent mild shortness of breath, worse with exertion, poor historian. On exam has some posterior rales, heart irregular, no unilateral deficits, speech clear and answers most questions appropriately but does have some confusion, and will follow commands  Labs and imaging reviewed. IV antibiotics for UTI. Previous records reviewed and did have some confusion noted on previous admission.   Discussed with Dr. Lovell Sheehan, Will admit  Results for orders placed during the hospital encounter of 02/18/13  URINALYSIS, ROUTINE W REFLEX MICROSCOPIC      Result Value Range   Color, Urine YELLOW  YELLOW   APPearance CLOUDY (*) CLEAR   Specific Gravity, Urine >1.030 (*) 1.005 - 1.030   pH 6.0  5.0 - 8.0   Glucose, UA NEGATIVE  NEGATIVE mg/dL   Hgb urine dipstick LARGE (*) NEGATIVE   Bilirubin Urine NEGATIVE  NEGATIVE   Ketones, ur NEGATIVE  NEGATIVE mg/dL   Protein, ur 132 (*) NEGATIVE mg/dL   Urobilinogen, UA 0.2  0.0 - 1.0 mg/dL   Nitrite POSITIVE (*) NEGATIVE   Leukocytes, UA SMALL (*) NEGATIVE  CBC      Result Value Range   WBC 14.6 (*) 4.0 - 10.5 K/uL   RBC 4.85  4.22 - 5.81 MIL/uL   Hemoglobin 13.4  13.0 - 17.0 g/dL   HCT 44.0  10.2 - 72.5 %   MCV 87.8  78.0 - 100.0 fL   MCH 27.6  26.0 - 34.0 pg   MCHC 31.5  30.0 - 36.0 g/dL   RDW 36.6  44.0 - 34.7 %   Platelets 294  150 - 400 K/uL  PRO B NATRIURETIC PEPTIDE      Result Value Range   Pro B Natriuretic peptide (BNP) 2611.0 (*) 0 - 125 pg/mL  COMPREHENSIVE METABOLIC PANEL      Result Value Range   Sodium 137  135 - 145 mEq/L   Potassium 4.7  3.5 - 5.1 mEq/L   Chloride 102  96 - 112 mEq/L   CO2 26  19 - 32 mEq/L   Glucose, Bld 124 (*) 70 - 99 mg/dL   BUN 28 (*) 6 - 23 mg/dL   Creatinine, Ser 4.25  0.50 - 1.35 mg/dL   Calcium 9.6  8.4 - 95.6 mg/dL   Total Protein 7.9  6.0  - 8.3 g/dL   Albumin 4.3  3.5 - 5.2 g/dL   AST 20  0 - 37 U/L   ALT 16  0 - 53 U/L   Alkaline Phosphatase 86  39 - 117 U/L   Total Bilirubin 0.5  0.3 - 1.2 mg/dL   GFR calc non Af Amer 60 (*) >90 mL/min   GFR calc Af Amer 70 (*) >90 mL/min  APTT      Result Value Range   aPTT 32  24 - 37 seconds  PROTIME-INR      Result Value Range   Prothrombin Time 14.3  11.6 - 15.2 seconds   INR 1.13  0.00 - 1.49  URINE MICROSCOPIC-ADD ON      Result Value Range   Squamous Epithelial / LPF RARE  RARE   WBC, UA 21-50  <3 WBC/hpf   RBC / HPF 21-50  <3 RBC/hpf   Bacteria, UA MANY (*) RARE   Urine-Other LESS THAN 10 mL OF URINE SUBMITTED  POCT I-STAT TROPONIN I      Result Value Range   Troponin i, poc 0.02  0.00 - 0.08 ng/mL   Comment 3            Dg Chest 2 View  02/18/2013   CLINICAL DATA:  Chest pain with worsening shortness of breath.  EXAM: CHEST  2 VIEW  COMPARISON:  01/20/2013.  FINDINGS: Cardiomegaly with pulmonary vascular congestion. Probable interstitial pulmonary edema. Mediastinal contours appear within normal limits and unchanged.  IMPRESSION: Cardiomegaly with pulmonary vascular congestion and mild interstitial pulmonary edema suggesting mild CHF.   Electronically Signed   By: Andreas Newport M.D.   On: 02/18/2013 23:48   Dg Chest 2 View  01/20/2013   CLINICAL DATA:  Shortness of breath. History of COPD.  EXAM: CHEST  2 VIEW  COMPARISON:  Single view of the chest 01/11/2003. CT chest 08/12/2011.  FINDINGS: There is cardiomegaly without edema. Lungs are clear. The chest appears hyperexpanded. No pneumothorax or pleural fluid.  IMPRESSION: Cardiomegaly and findings compatible with emphysema. No acute abnormality.   Electronically Signed   By: Drusilla Kanner M.D.   On: 01/20/2013 06:07   Ct Head Wo Contrast  02/19/2013   CLINICAL DATA:  Abnormal cognition. Altered mental status. Chest pain.  EXAM: CT HEAD WITHOUT CONTRAST  TECHNIQUE: Contiguous axial images were obtained from the  base of the skull through the vertex without intravenous contrast.  COMPARISON:  01/10/2013.  FINDINGS: No mass lesion, mass effect, midline shift, hydrocephalus, hemorrhage. No acute territorial cortical ischemia/infarct. Atrophy and chronic ischemic white matter disease is present.More focal chronic ischemic changes present in the left parietal lobe. Paranasal sinuses and mastoid air cells are within normal limits.  IMPRESSION: No interval change or acute intracranial abnormality. Atrophy and chronic ischemic white matter disease.   Electronically Signed   By: Andreas Newport M.D.   On: 02/19/2013 01:31      Sunnie Nielsen, MD 02/19/13 [phone]

## 2013-02-19 NOTE — Progress Notes (Signed)
Unable to obtain history; patient is acutely confused.

## 2013-02-19 NOTE — Progress Notes (Signed)
Discussed with Dr Pierce Crane will be transferred to his service. Thanks. [facility] will sign off.

## 2013-02-19 NOTE — H&P (Signed)
[facility] History and Physical  ROWIN BAYRON NWG:[id number] DOB: 1946-01-12 DOA: 02/18/2013  Referring physician:  EDP PCP: Toma Deiters, MD  Specialists:   Chief Complaint: Chest Pain  HPI: Mitchell [name] is a 67 y.o. male with a history of Chronic Systolic CHF and COPD who presents tot he ED with complaints of Chest Pain  And SOB that started in the evening.  HE was evaluated in ED and was found to have a negative 1st troponin and due to his history was referred for further medical evaluation.  His cardiologist is Dr Sharyn Lull.   He is confused at this time and can not give his history.     Review of Systems: Unable to Obtain from the Patient  Past Medical History  Diagnosis Date  . COPD (chronic obstructive pulmonary disease)   . Hypertension   . Pneumonia   . Systolic heart failure   . AF (atrial fibrillation) 08/21/2011  . Anxiety   . History of kidney stones   . Cancer   . Prostate cancer   . Leukemia   . Leukemia   . Chronic kidney disease     Past Surgical History  Procedure Laterality Date  . Colon surgery    . Prostate surgery  1999  . Transurethral resection of bladder tumor  08/25/2011    Procedure: TRANSURETHRAL RESECTION OF BLADDER TUMOR (TURBT);  Surgeon: Ky Barban, MD;  Location: [facility];  Service: Urology;  Laterality: N/A;  Transurethral Resection of Bladder Neck  . Cystoscopy with urethral dilatation  12/30/2011    Procedure: CYSTOSCOPY WITH URETHRAL DILATATION;  Surgeon: Ky Barban, MD;  Location: [facility];  Service: Urology;;  . [place]  12/30/2011    Procedure: CYSTOSCOPY/URETHROTOMY;  Surgeon: Ky Barban, MD;  Location: [facility];  Service: Urology;;  . Foreign body removal  12/30/2011    Procedure: FOREIGN BODY REMOVAL;  Surgeon: Ky Barban, MD;  Location: [facility];  Service: Urology;;  Transurethral Removal of 4 clips from bladder neck  . Partial colectomy  2012    [facility]  . Transurethral resection of bladder neck   07/27/2012    Procedure: TRANSURETHRAL RESECTION OF BLADDER NECK;  Surgeon: Ky Barban, MD;  Location: [facility];  Service: Urology;;  . Cystoscopy with urethral dilatation  07/27/2012    Procedure: CYSTOSCOPY WITH URETHRAL DILATATION;  Surgeon: Ky Barban, MD;  Location: [facility];  Service: Urology;;  . Foreign body removal  07/27/2012    Procedure: FOREIGN BODY REMOVAL-CLIPS BLADDER NECK;  Surgeon: Ky Barban, MD;  Location: [facility];  Service: Urology;;  . Cataract extraction w/phaco Right 10/04/2012    Procedure: CATARACT EXTRACTION PHACO AND INTRAOCULAR LENS PLACEMENT (IOC);  Surgeon: Gemma Payor, MD;  Location: [facility];  Service: Ophthalmology;  Laterality: Right;  CDE:12.16  . Eye surgery    . Cataract extraction w/phaco Left 10/18/2012    Procedure: CATARACT EXTRACTION PHACO AND INTRAOCULAR LENS PLACEMENT (IOC);  Surgeon: Gemma Payor, MD;  Location: [facility];  Service: Ophthalmology;  Laterality: Left;  CDE:14.42    Prior to Admission medications   Medication Sig Start Date End Date Taking? Authorizing Provider  albuterol (PROVENTIL HFA;VENTOLIN HFA) 108 (90 BASE) MCG/ACT inhaler Inhale 2 puffs into the lungs every 6 (six) hours as needed. For shortness of breath     Historical Provider, MD  ALPRAZolam Prudy Feeler) 1 MG tablet Take 1 mg by mouth 3 (three) times daily.    Historical Provider, MD  aspirin  EC 81 MG EC tablet Take 1 tablet (81 mg total) by mouth daily. 01/05/13   Robynn Pane, MD  atorvastatin (LIPITOR) 20 MG tablet Take 1 tablet (20 mg total) by mouth daily at 6 PM. 01/05/13   Robynn Pane, MD  cephALEXin (KEFLEX) 500 MG capsule Take 1 capsule (500 mg total) by mouth 4 (four) times daily. 01/08/13   Dione Booze, MD  ciprofloxacin (CIPRO) 500 MG tablet Take 1 tablet (500 mg total) by mouth 2 (two) times daily. 01/05/13   Robynn Pane, MD  citalopram (CELEXA) 40 MG tablet Take 0.5 tablets (20 mg total) by mouth daily. 01/05/13   Robynn Pane, MD  digoxin (LANOXIN) 0.25 MG  tablet Take 0.25 mg by mouth daily.    Wilson Singer, MD  diltiazem (CARDIZEM CD) 180 MG 24 hr capsule  12/07/12   Historical Provider, MD  docusate sodium (COLACE) 100 MG capsule Take 100 mg by mouth as needed. For constipation     Historical Provider, MD  doxycycline (VIBRA-TABS) 100 MG tablet  01/14/13   Historical Provider, MD  Fluticasone-Salmeterol (ADVAIR DISKUS) 250-50 MCG/DOSE AEPB Inhale 1 puff into the lungs every 12 (twelve) hours.      Historical Provider, MD  furosemide (LASIX) 40 MG tablet  02/16/13   Historical Provider, MD  imatinib (GLEEVEC) 100 MG tablet Take 200 mg by mouth daily. Take with meals and large glass of water.Caution:Chemotherapy    Historical Provider, MD  ipratropium-albuterol (DUONEB) 0.5-2.5 (3) MG/3ML SOLN Take 3 mLs by nebulization every 6 (six) hours as needed.    Historical Provider, MD  lisinopril (PRINIVIL,ZESTRIL) 40 MG tablet Take 40 mg by mouth daily.    Historical Provider, MD  metoprolol (LOPRESSOR) 50 MG tablet  01/19/13   Historical Provider, MD  metoprolol succinate (TOPROL-XL) 100 MG 24 hr tablet Take 1 tablet (100 mg total) by mouth daily. Take with or immediately following a meal. 01/05/13   Robynn Pane, MD  mometasone (ELOCON) 0.1 % cream Apply 1 application topically daily as needed. Skin lesions     Historical Provider, MD  polyethylene glycol powder (GLYCOLAX/MIRALAX) powder Take 17 g by mouth daily as needed.     Historical Provider, MD  potassium chloride (K-DUR,KLOR-CON) 10 MEQ tablet  02/16/13   Historical Provider, MD  predniSONE (DELTASONE) 20 MG tablet  01/14/13   Historical Provider, MD  Rivaroxaban (XARELTO) 20 MG TABS Take 1 tablet by mouth daily.    Historical Provider, MD  spironolactone (ALDACTONE) 25 MG tablet Take 1 tablet (25 mg total) by mouth 2 (two) times daily. 01/05/13   Robynn Pane, MD  traMADol (ULTRAM) 50 MG tablet Take 1 tablet (50 mg total) by mouth every 6 (six) hours as needed for pain. 01/08/13   Dione Booze,  MD  venlafaxine (EFFEXOR) 75 MG tablet Take 75 mg by mouth 2 (two) times daily.    Historical Provider, MD  zolpidem (AMBIEN) 10 MG tablet  01/05/13   Historical Provider, MD    No Known Allergies  Social History:  reports that he has been smoking Cigarettes.  He has a 21 pack-year smoking history. He has quit using smokeless tobacco. He reports that he does not drink alcohol or use illicit drugs.     Family History  Problem Relation Age of Onset  . Heart attack Mother   . Cancer Father   . Stroke Father       Physical Exam:  GEN:  Pleasantly confused Obese  Elderly appearing  67 y.o. Caucasian male  examined  and in no acute distress; cooperative with exam Filed Vitals:   02/18/13 2345 02/19/13 0145 02/19/13 0252 02/19/13 0253  BP: 119/60 120/66  128/82  Pulse: 85 77  85  Temp:    98 F (36.7 C)  TempSrc:    Oral  Resp: 22 23  20   Height:   6\' 1"  (1.854 m)   Weight:   92.171 kg (203 lb 3.2 oz)   SpO2: 98% 100%  96%   Blood pressure 128/82, pulse 85, temperature 98 F (36.7 C), temperature source Oral, resp. rate 20, height 6\' 1"  (1.854 m), weight 92.171 kg (203 lb 3.2 oz), SpO2 96.00%. PSYCH: He is alert and oriented x 1; does not appear anxious does not appear depressed; affect is normal HEENT: Normocephalic and Atraumatic, Mucous membranes pink; PERRLA; EOM intact; Fundi:  Benign;  No scleral icterus, Nares: Patent, Oropharynx: Clear, Neck:  FROM, no cervical lymphadenopathy nor thyromegaly or carotid bruit; no JVD; Breasts:: Not examined CHEST WALL: No tenderness CHEST: Normal respiration, clear to auscultation bilaterally HEART: Regular rate and rhythm; no murmurs rubs or gallops BACK: No kyphosis or scoliosis; no CVA tenderness ABDOMEN: Positive Bowel Sounds,  Obese, soft non-tender; no masses, no organomegaly, no pannus; no intertriginous candida. Rectal Exam: Not done EXTREMITIES: No cyanosis, clubbing or edema; no ulcerations. Genitalia: not examined PULSES: 2+  and symmetric SKIN: Normal hydration no rash or ulceration CNS: Cranial nerves 2-12 grossly intact no focal neurologic deficit    Labs on Admission:  Basic Metabolic Panel:  Recent Labs Lab 02/18/13 2300  NA 137  K 4.7  CL 102  CO2 26  GLUCOSE 124*  BUN 28*  CREATININE 1.21  CALCIUM 9.6   Liver Function Tests:  Recent Labs Lab 02/18/13 2300  AST 20  ALT 16  ALKPHOS 86  BILITOT 0.5  PROT 7.9  ALBUMIN 4.3   No results found for this basename: LIPASE, AMYLASE,  in the last 168 hours No results found for this basename: AMMONIA,  in the last 168 hours CBC:  Recent Labs Lab 02/18/13 2300  WBC 14.6*  HGB 13.4  HCT 42.6  MCV 87.8  PLT 294   Cardiac Enzymes: No results found for this basename: CKTOTAL, CKMB, CKMBINDEX, TROPONINI,  in the last 168 hours  BNP (last 3 results)  Recent Labs  01/04/13 1644 01/20/13 0507 02/18/13 2300  PROBNP 6748.0* 915.3* 2611.0*   CBG: No results found for this basename: GLUCAP,  in the last 168 hours  Radiological Exams on Admission: Dg Chest 2 View  02/18/2013   CLINICAL DATA:  Chest pain with worsening shortness of breath.  EXAM: CHEST  2 VIEW  COMPARISON:  01/20/2013.  FINDINGS: Cardiomegaly with pulmonary vascular congestion. Probable interstitial pulmonary edema. Mediastinal contours appear within normal limits and unchanged.  IMPRESSION: Cardiomegaly with pulmonary vascular congestion and mild interstitial pulmonary edema suggesting mild CHF.   Electronically Signed   By: Andreas Newport M.D.   On: 02/18/2013 23:48   Ct Head Wo Contrast  02/19/2013   CLINICAL DATA:  Abnormal cognition. Altered mental status. Chest pain.  EXAM: CT HEAD WITHOUT CONTRAST  TECHNIQUE: Contiguous axial images were obtained from the base of the skull through the vertex without intravenous contrast.  COMPARISON:  01/10/2013.  FINDINGS: No mass lesion, mass effect, midline shift, hydrocephalus, hemorrhage. No acute territorial cortical  ischemia/infarct. Atrophy and chronic ischemic white matter disease is present.More focal chronic ischemic changes present in  the left parietal lobe. Paranasal sinuses and mastoid air cells are within normal limits.  IMPRESSION: No interval change or acute intracranial abnormality. Atrophy and chronic ischemic white matter disease.   Electronically Signed   By: Andreas Newport M.D.   On: 02/19/2013 01:31     EKG: Independently reviewed.     Assessment/Plan Principal Problem:   Chest pain Active Problems:   UTI (urinary tract infection)   Acute confusional state   CLL   HYPERTENSION   PROSTATE CANCER, HX OF   AF (atrial fibrillation)   COPD (chronic obstructive pulmonary disease)   Chronic systolic heart failure    CAD   1.  Chest Pain and hx CAD-  Admit to telemetry Bed for cardiac rule out.  Cycle troponins, and placed on NTG,O2 and ASA rx.  Already on Xarelto Rx.    2.  Acute confusional state- Most likely due to UTI, IV Rocephin , check Digoxin level,  Monitor for changes.    3.  UTI- Due to Urinary Retention, Urine C=S sent, and placed on IV Rocephin.     4.  COPD- stable , continue maintenance and rescue therapy.    5.   CAD-  Previous Cardiac Cath which revealed Disease.    6.   Chronic systolic CHF- Last 2 D-Echo 07/2011, on Betablocker, Ace inhibitor,Digoxin, and Lasix and Spironolactone for diuretic Rx.  Order 2D ECHO for Changes.    7.   CLL- on Gleevec rx, continue.    8.   Atrial Fibrillation-  Continue Diltiazem, and Metoprolol and Digoxin, and Xarelto Rx  9.   HTN-   Continue meds in #6, and Diltiazem.     10.  Prostate Cancer hx- remote hx, treated surgically.         Code Status:   FULL CODE Family Communication:  No family Present Disposition Plan:   Observation  Time spent:  11 Minutes  Ron Parker [facility] Pager [phone]  If 7PM-7AM, please contact night-coverage [email or web address] Password [email or web address] 02/19/2013, 3:14 AM

## 2013-02-19 NOTE — Plan of Care (Signed)
Problem: Phase I Progression Outcomes Goal: Dyspnea controlled at rest (HF) Outcome: Not Met (add Reason) Dyspnea at rest persists. Crackles clearly heard over lower lung fields. CXR on admission and BNP indicate heart failure exacerbation. Spoke with Lenny Pastel, NP on call for [facility].  Orders rec'd for lasix 20mg  IV x 1 dose and ABG to evaluate oxygenation and CO2 status.  Continuing to monitor.    Goal: Other Phase I Outcomes/Goals Outcome: Progressing Management of UTI with IV antibiotics per MD orders. Progression per clinical pathway initiated.

## 2013-02-19 NOTE — Progress Notes (Signed)
Utilization Review Completed.  

## 2013-02-19 NOTE — Progress Notes (Addendum)
Subjective:  Awakens easily. Afebrile. Elevated WBC count.  Objective:  Vital Signs in the last 24 hours: Temp:  [97.6 F (36.4 C)-98 F (36.7 C)] 97.9 F (36.6 C) (10/25 0602) Pulse Rate:  [70-90] 87 (10/25 1000) Cardiac Rhythm:  [-] Atrial fibrillation (10/25 0752) Resp:  [20-24] 20 (10/25 0602) BP: (106-128)/(60-82) 120/70 mmHg (10/25 0959) SpO2:  [94 %-100 %] 94 % (10/25 0602) Weight:  [91 kg (200 lb 9.9 oz)-92.171 kg (203 lb 3.2 oz)] 91 kg (200 lb 9.9 oz) (10/25 0602)  Physical Exam: BP Readings from Last 1 Encounters:  02/19/13 120/70     Wt Readings from Last 1 Encounters:  02/19/13 91 kg (200 lb 9.9 oz)    Weight change:   HEENT: [place]/AT, Eyes- PERL, EOMI, Conjunctiva-Pink, Sclera-Non-icteric Neck: No JVD, No bruit, Trachea midline. Lungs:  Crackles at bases, Bilateral. Cardiac:  Irregular rhythm, normal S1 and S2, no S3. II/VI systolic murmur. Abdomen:  Soft, non-tender. Extremities:  Trace ankle edema present. No cyanosis. No clubbing. CNS: AxOx1, Cranial nerves grossly intact, moves all 4 extremities.  Skin: Warm and dry.   Intake/Output from previous day: 10/24 0701 - 10/25 0700 In: 290 [P.O.:240; IV Piggyback:50] Out: 1 [Urine:1]    Lab Results: BMET    Component Value Date/Time   NA 139 02/19/2013 0350   K 4.3 02/19/2013 0350   CL 103 02/19/2013 0350   CO2 25 02/19/2013 0350   GLUCOSE 123* 02/19/2013 0350   BUN 32* 02/19/2013 0350   CREATININE 1.16 02/19/2013 0350   CALCIUM 9.0 02/19/2013 0350   GFRNONAA 63* 02/19/2013 0350   GFRAA 73* 02/19/2013 0350   CBC    Component Value Date/Time   WBC 18.9* 02/19/2013 0350   RBC 4.66 02/19/2013 0350   HGB 13.2 02/19/2013 0350   HCT 41.2 02/19/2013 0350   PLT 313 02/19/2013 0350   MCV 88.4 02/19/2013 0350   MCH 28.3 02/19/2013 0350   MCHC 32.0 02/19/2013 0350   RDW 15.1 02/19/2013 0350   LYMPHSABS 2.1 01/20/2013 0507   MONOABS 0.9 01/20/2013 0507   EOSABS 0.1 01/20/2013 0507   BASOSABS 0.0 01/20/2013  0507   CARDIAC ENZYMES Lab Results  Component Value Date   CKTOTAL 31 01/03/2013   CKMB 2.8 01/03/2013   TROPONINI <0.30 01/20/2013    Scheduled Meds: . ALPRAZolam  1 mg Oral TID  . aspirin EC  81 mg Oral Daily  . atorvastatin  20 mg Oral q1800  . citalopram  20 mg Oral Daily  . digoxin  0.25 mg Oral Daily  . diltiazem  180 mg Oral Daily  . docusate sodium  100 mg Oral BID  . furosemide  40 mg Oral Daily  . lisinopril  40 mg Oral Daily  . metoprolol  50 mg Oral BID  . mometasone-formoterol  2 puff Inhalation BID  . potassium chloride  10 mEq Oral Daily  . predniSONE  20 mg Oral Q breakfast  . Rivaroxaban  20 mg Oral Q supper  . sodium chloride  3 mL Intravenous Q12H  . sodium chloride  3 mL Intravenous Q12H  . spironolactone  25 mg Oral BID  . venlafaxine  75 mg Oral BID   Continuous Infusions:  PRN Meds:.sodium chloride, acetaminophen, acetaminophen, albuterol, alum & mag hydroxide-simeth, HYDROmorphone (DILAUDID) injection, ipratropium, ondansetron (ZOFRAN) IV, ondansetron, oxyCODONE, polyethylene glycol, sodium chloride, zolpidem  Assessment/Plan:  Chronic left heart systolic failure. Altered Mental Status UTI COPD CAD CLL Atrial fibrillation Hypertension H/O Prostate cancer.  Continue  medical treatment.    LOS: 1 day    Orpah Cobb  MD  02/19/2013, 10:18 AM

## 2013-02-20 NOTE — Progress Notes (Signed)
Subjective:  Feeling better. Confusion persist. Afebrile.   Objective:  Vital Signs in the last 24 hours: Temp:  [97.6 F (36.4 C)-98.4 F (36.9 C)] 97.6 F (36.4 C) (10/26 0933) Pulse Rate:  [57-90] 77 (10/26 0941) Cardiac Rhythm:  [-] Atrial fibrillation (10/26 0912) Resp:  [18-24] 24 (10/26 0933) BP: (106-137)/(47-74) 114/70 mmHg (10/26 0941) SpO2:  [92 %-96 %] 93 % (10/26 0933)  Physical Exam: BP Readings from Last 1 Encounters:  02/20/13 114/70     Wt Readings from Last 1 Encounters:  02/19/13 91 kg (200 lb 9.9 oz)    Weight change:   HEENT: [place]/AT, Eyes-PERL, EOMI, Conjunctiva-Pink, Sclera-Non-icteric Neck: No JVD, No bruit, Trachea midline. Lungs:  Clearing, Bilateral. Cardiac:  Regular rhythm, normal S1 and S2, no S3. II/VI systolic murmur. Abdomen:  Soft, non-tender. Extremities:  No edema present. No cyanosis. No clubbing. CNS: AxOx2, Cranial nerves grossly intact, moves all 4 extremities.  Skin: Warm and dry.   Intake/Output from previous day: 10/25 0701 - 10/26 0700 In: 363 [P.O.:360; I.V.:3] Out: 300 [Urine:300]    Lab Results: BMET    Component Value Date/Time   NA 139 02/19/2013 0350   K 4.3 02/19/2013 0350   CL 103 02/19/2013 0350   CO2 25 02/19/2013 0350   GLUCOSE 123* 02/19/2013 0350   BUN 32* 02/19/2013 0350   CREATININE 1.16 02/19/2013 0350   CALCIUM 9.0 02/19/2013 0350   GFRNONAA 63* 02/19/2013 0350   GFRAA 73* 02/19/2013 0350   CBC    Component Value Date/Time   WBC 18.9* 02/19/2013 0350   RBC 4.66 02/19/2013 0350   HGB 13.2 02/19/2013 0350   HCT 41.2 02/19/2013 0350   PLT 313 02/19/2013 0350   MCV 88.4 02/19/2013 0350   MCH 28.3 02/19/2013 0350   MCHC 32.0 02/19/2013 0350   RDW 15.1 02/19/2013 0350   LYMPHSABS 2.1 01/20/2013 0507   MONOABS 0.9 01/20/2013 0507   EOSABS 0.1 01/20/2013 0507   BASOSABS 0.0 01/20/2013 0507   CARDIAC ENZYMES Lab Results  Component Value Date   CKTOTAL 31 01/03/2013   CKMB 2.8 01/03/2013   TROPONINI  <0.30 02/19/2013    Scheduled Meds: . ALPRAZolam  1 mg Oral TID  . aspirin EC  81 mg Oral Daily  . atorvastatin  20 mg Oral q1800  . citalopram  20 mg Oral Daily  . digoxin  0.25 mg Oral Daily  . diltiazem  180 mg Oral Daily  . docusate sodium  100 mg Oral BID  . furosemide  40 mg Oral Daily  . heparin subcutaneous  5,000 Units Subcutaneous Q8H  . lisinopril  40 mg Oral Daily  . metoprolol  50 mg Oral BID  . mometasone-formoterol  2 puff Inhalation BID  . potassium chloride  10 mEq Oral Daily  . predniSONE  20 mg Oral Q breakfast  . sodium chloride  3 mL Intravenous Q12H  . sodium chloride  3 mL Intravenous Q12H  . spironolactone  25 mg Oral BID  . venlafaxine  75 mg Oral BID   Continuous Infusions:  PRN Meds:.sodium chloride, acetaminophen, acetaminophen, albuterol, alum & mag hydroxide-simeth, HYDROmorphone (DILAUDID) injection, ipratropium, ondansetron (ZOFRAN) IV, ondansetron, oxyCODONE, polyethylene glycol, sodium chloride, zolpidem  Assessment/Plan:   Altered Mental Status  UTI  Chronic left heart systolic failure. COPD  CAD  CLL  Atrial fibrillation  Hypertension  H/O Prostate cancer.  Blood work in AM.   LOS: 2 days    Orpah Cobb  MD  02/20/2013, 10:50  AM

## 2013-02-20 NOTE — Progress Notes (Signed)
Pt hadc/o chest pain. EKG done with minor EKG changes. Dr. Algie Coffer updated. With order to give Dilaudid. Initially given maalox. Pt relieved with Dilaudid.

## 2013-02-21 LAB — MRSA PCR SCREENING: MRSA by PCR: NEGATIVE

## 2013-02-21 MED ORDER — OXYCODONE HCL 5 MG PO TABS
5.0000 mg | ORAL_TABLET | Freq: Four times a day (QID) | ORAL | Status: DC | PRN
  Administered 2013-02-22: 5 mg via ORAL
  Filled 2013-02-21: qty 1

## 2013-02-21 MED ORDER — AMOXICILLIN 500 MG PO CAPS
500.0000 mg | ORAL_CAPSULE | Freq: Three times a day (TID) | ORAL | Status: DC
  Administered 2013-02-21 – 2013-02-22 (×3): 500 mg via ORAL
  Filled 2013-02-21 (×6): qty 1

## 2013-02-21 MED ORDER — CARVEDILOL 12.5 MG PO TABS
12.5000 mg | ORAL_TABLET | Freq: Two times a day (BID) | ORAL | Status: DC
  Administered 2013-02-21 – 2013-02-22 (×2): 12.5 mg via ORAL
  Filled 2013-02-21 (×4): qty 1

## 2013-02-21 MED ORDER — RIVAROXABAN 20 MG PO TABS
20.0000 mg | ORAL_TABLET | Freq: Every day | ORAL | Status: DC
  Administered 2013-02-21: 20 mg via ORAL
  Filled 2013-02-21 (×2): qty 1

## 2013-02-21 MED ORDER — ALPRAZOLAM 0.5 MG PO TABS
0.5000 mg | ORAL_TABLET | Freq: Three times a day (TID) | ORAL | Status: DC
  Administered 2013-02-21 (×2): 0.5 mg via ORAL
  Filled 2013-02-21 (×2): qty 1

## 2013-02-21 NOTE — Progress Notes (Signed)
OT Cancellation Note  Patient Details Name: Mitchell [name] MRN: [id number] DOB: [birth year]   Cancelled Treatment:    Reason Eval/Treat Not Completed: Other (comment). Walked into room and pt sound asleep. Sitter in room reports that pt just finished with PT and went straight to sleep. Will re-try eval tomorrow as appropriate.  Evette Georges [phone] 02/21/2013, 3:31 PM

## 2013-02-21 NOTE — Evaluation (Signed)
Physical Therapy Evaluation Patient Details Name: Mitchell [name] MRN: [id number] DOB: 07-Aug-1945 Today's Date: 02/21/2013 Time: 2440-1027 PT Time Calculation (min): 17 min  PT Assessment / Plan / Recommendation History of Present Illness  Pt adm with chest pain.  MI ruled out.  Pt also with UTI and confusion.  Clinical Impression  Pt admitted with above. Pt currently with functional limitations due to the deficits listed below (see PT Problem List). Unsure of pt's home situation and how much assistance/supervision he has at home. Pt will benefit from skilled PT to increase their independence and safety with mobility to allow discharge to least restrictive environment.  Lethargy and cognition is primary limiting factor of mobility currently.        PT Assessment  Patient needs continued PT services    Follow Up Recommendations  Supervision/Assistance - 24 hour;Home health PT; if family cannot provide 24 hour care pt may need ST-SNF.    Does the patient have the potential to tolerate intense rehabilitation      Barriers to Discharge        Equipment Recommendations  None recommended by PT    Recommendations for Other Services     Frequency Min 3X/week    Precautions / Restrictions Precautions Precautions: Fall   Pertinent Vitals/Pain No signs of pain.      Mobility  Bed Mobility Bed Mobility: Supine to Sit;Sit to Sidelying Left Supine to Sit: 4: Min assist;HOB elevated Sit to Sidelying Left: 4: Min assist;HOB flat Details for Bed Mobility Assistance: Assit to bring trunk up into sitting and to return legs to  bed.  Verbal/tactile cues to help pt initiated activity. Transfers Transfers: Sit to Stand;Stand to Sit Sit to Stand: 4: Min assist;With upper extremity assist;From bed;With armrests;From chair/3-in-1 Stand to Sit: 4: Min guard;With upper extremity assist;With armrests;To chair/3-in-1;To bed Details for Transfer Assistance: Assist for  balance. Ambulation/Gait Ambulation/Gait Assistance: 4: Min assist Ambulation Distance (Feet): 20 Feet Assistive device: Rolling walker;None Ambulation/Gait Assistance Details: Tried using rolling walker but pt just kept pushing it aside.  Pt stopped several times when walking and needed verbal/tactile cues to resume. Gait Pattern: Step-through pattern;Decreased step length - right;Decreased step length - left;Shuffle Gait velocity: slow    Exercises     PT Diagnosis: Difficulty walking;Generalized weakness  PT Problem List: Decreased strength;Decreased activity tolerance;Decreased balance;Decreased mobility;Decreased knowledge of use of DME;Decreased cognition PT Treatment Interventions: DME instruction;Gait training;Functional mobility training;Therapeutic activities;Therapeutic exercise;Balance training;Patient/family education     PT Goals(Current goals can be found in the care plan section) Acute Rehab PT Goals Patient Stated Goal: Pt unable to state. PT Goal Formulation: Patient unable to participate in goal setting Time For Goal Achievement: 02/28/13 Potential to Achieve Goals: Good  Visit Information  Last PT Received On: 02/21/13 Assistance Needed: +1 History of Present Illness: Pt adm with chest pain.  MI ruled out.  Pt also with UTI and confusion.       Prior Functioning  Home Living Family/patient expects to be discharged to:: Private residence Living Arrangements: Spouse/significant other Additional Comments: Pt unable to answer above questions at this time. Prior Function Comments: Pt unable to answer at this time.    Cognition  Cognition Arousal/Alertness: Lethargic Overall Cognitive Status: No family/caregiver present to determine baseline cognitive functioning    Extremity/Trunk Assessment Upper Extremity Assessment Upper Extremity Assessment: Defer to OT evaluation Lower Extremity Assessment Lower Extremity Assessment: Generalized weakness   Balance  Balance Balance Assessed: Yes Static Standing Balance Static Standing - Balance  Support: Left upper extremity supported Static Standing - Level of Assistance: 4: Min assist  End of Session PT - End of Session Equipment Utilized During Treatment: Gait belt Activity Tolerance: Patient limited by lethargy Patient left: in bed;with call bell/phone within reach;with nursing/sitter in room Nurse Communication: Mobility status  GP     [facility] 02/21/2013, 3:38 PM  [facility] PT [phone]

## 2013-02-21 NOTE — Progress Notes (Signed)
Subjective:  Patient denies any chest pain or shortness of breath  Objective:  Vital Signs in the last 24 hours: Temp:  [97.2 F (36.2 C)-98.4 F (36.9 C)] 97.4 F (36.3 C) (10/27 0523) Pulse Rate:  [67-76] 67 (10/27 0523) Resp:  [17-22] 17 (10/27 0523) BP: (109-136)/(58-71) 124/65 mmHg (10/27 0523) SpO2:  [95 %-96 %] 95 % (10/27 0523)  Intake/Output from previous day: 10/26 0701 - 10/27 0700 In: 840 [P.O.:840] Out: -  Intake/Output from this shift:    Physical Exam: Neck: no adenopathy, no carotid bruit, no JVD and supple, symmetrical, trachea midline Lungs: clear to auscultation bilaterally Heart: irregularly irregular rhythm, S1, S2 normal and Soft systolic murmur noted Abdomen: soft, non-tender; bowel sounds normal; no masses,  no organomegaly Extremities: extremities normal, atraumatic, no cyanosis or edema  Lab Results:  Recent Labs  02/18/13 2300 02/19/13 0350  WBC 14.6* 18.9*  HGB 13.4 13.2  PLT 294 313    Recent Labs  02/18/13 2300 02/19/13 0350  NA 137 139  K 4.7 4.3  CL 102 103  CO2 26 25  GLUCOSE 124* 123*  BUN 28* 32*  CREATININE 1.21 1.16    Recent Labs  02/19/13 0955 02/19/13 1455  TROPONINI <0.30 <0.30   Hepatic Function Panel  Recent Labs  02/18/13 2300  PROT 7.9  ALBUMIN 4.3  AST 20  ALT 16  ALKPHOS 86  BILITOT 0.5   No results found for this basename: CHOL,  in the last 72 hours No results found for this basename: PROTIME,  in the last 72 hours  Imaging: Imaging results have been reviewed and No results found.  Cardiac Studies:  Assessment/Plan:  Status post atypical chest pain MI ruled out Resolving UTI Status post altered mental status Hypertension Chronic A. fib COPD Compensated systolic heart failure History of tobacco abuse next history of CA of prostate History of CLL History of urinary retention secondary to bladder neck obstruction in the past IVS Depression History of tobacco abuse Plan Will DC  Cardizem in view of depressed LV systolic function Change metoprolol tartrate to Coreg Restart xareltto. Check urine cultures  LOS: 3 days    Mitchell [name] 02/21/2013, 11:46 AM

## 2013-02-21 NOTE — Progress Notes (Signed)
ANTICOAGULATION CONSULT NOTE - Initial Consult  Pharmacy Consult for Xarelto Indication: atrial fibrillation  No Known Allergies  Patient Measurements: Height: 6\' 1"  (185.4 cm) Weight: 200 lb 9.9 oz (91 kg) IBW/kg (Calculated) : 79.9   Vital Signs: Temp: 97.4 F (36.3 C) (10/27 0523) Temp src: Oral (10/27 0523) BP: 124/65 mmHg (10/27 0523) Pulse Rate: 67 (10/27 0523)  Labs:  Recent Labs  02/18/13 2300 02/19/13 0350 02/19/13 0955 02/19/13 1455  HGB 13.4 13.2  --   --   HCT 42.6 41.2  --   --   PLT 294 313  --   --   APTT 32  --   --   --   LABPROT 14.3  --   --   --   INR 1.13  --   --   --   CREATININE 1.21 1.16  --   --   TROPONINI  --   --  <0.30 <0.30    Estimated Creatinine Clearance: 69.8 ml/min (by C-G formula based on Cr of 1.16).   Medical History: Past Medical History  Diagnosis Date  . COPD (chronic obstructive pulmonary disease)   . Hypertension   . Pneumonia   . Systolic heart failure   . AF (atrial fibrillation) 08/21/2011  . Anxiety   . History of kidney stones   . Cancer   . Prostate cancer   . Leukemia   . Leukemia   . Chronic kidney disease       Assessment: 67yom admitted with CP and SOB.  He has Hx Afib CVR and HF EF 35% - meds adjusted per MD.  He was anticoagulated with rivaroxaban pta - but that was stopped pta for hematuria by urologist.  Hematuria resolved but also in setting of enterococcus UTI now being treated with amoxicillin - f/u sensitivites.  Restart Rivaroxaban.  Goal of Therapy:   Monitor platelets by anticoagulation protocol: Yes   Plan:   Rivaroxaban 20mg  daily with supper Follow CBC, renal function  Monitor for bleeding   Leota Sauers Pharm.D. CPP, BCPS Clinical Pharmacist [phone] 02/21/2013 1:10 PM

## 2013-02-21 NOTE — Clinical Documentation Improvement (Signed)
THIS DOCUMENT IS NOT A PERMANENT PART OF THE MEDICAL RECORD  Please update your documentation with the medical record to reflect your response to this query. If you need help knowing how to do this please call [phone].  02/21/13  Dear Dr. Algie Coffer,   Per chart patient with "confusion" and "altered mental status" admitted with UTI and chest pain. In the Coding world the terms "confusion" and "altered mental status" are nonspecific and low weighted. If possible, please clarify these terms to illustrate this patient's severity of illness and risk of  mortality. Thank you.  Possible Conditions? - toxic encephalopathy - acute encephalopathy - resolving acute encephalopathy - confusion/altered mental status - other condition (please specify)  Reviewed:  no additional documentation provided. Altered mental status is secondary to UTI plus some confusion is always present for this patient.  Thank You,  Beverley Fiedler RN BSN Clinical Documentation Specialist: [phone] Health Information Management [place]

## 2013-02-22 LAB — BASIC METABOLIC PANEL
BUN: 21 mg/dL (ref 6–23)
CO2: 25 mEq/L (ref 19–32)
Chloride: 105 mEq/L (ref 96–112)
GFR calc Af Amer: 85 mL/min — ABNORMAL LOW (ref >90)
Glucose, Bld: 114 mg/dL — ABNORMAL HIGH (ref 70–99)
Potassium: 3.3 mEq/L — ABNORMAL LOW (ref 3.5–5.1)

## 2013-02-22 LAB — URINE CULTURE: Colony Count: >=100000

## 2013-02-22 LAB — URINE MICROSCOPIC-ADD ON

## 2013-02-22 LAB — CBC
HCT: 38.4 % — ABNORMAL LOW (ref 39.0–52.0)
Hemoglobin: 12.4 g/dL — ABNORMAL LOW (ref 13.0–17.0)
MCH: 27.5 pg (ref 26.0–34.0)
MCHC: 32.3 g/dL (ref 30.0–36.0)
MCV: 85.1 fL (ref 78.0–100.0)
Platelets: 287 10*3/uL (ref 150–400)

## 2013-02-22 LAB — URINALYSIS, ROUTINE W REFLEX MICROSCOPIC
Glucose, UA: NEGATIVE mg/dL
Nitrite: NEGATIVE
Protein, ur: 100 mg/dL — AB
Specific Gravity, Urine: 1.017 (ref 1.005–1.030)

## 2013-02-22 MED ORDER — SPIRONOLACTONE 25 MG PO TABS: 25.0000 mg | ORAL_TABLET | Freq: Every day | ORAL | Status: AC

## 2013-02-22 MED ORDER — LEVOFLOXACIN 500 MG PO TABS: 500.0000 mg | ORAL_TABLET | Freq: Every day | ORAL | Status: DC

## 2013-02-22 MED ORDER — CARVEDILOL 12.5 MG PO TABS: 12.5000 mg | ORAL_TABLET | Freq: Two times a day (BID) | ORAL | Status: AC

## 2013-02-22 MED ORDER — RIVAROXABAN 20 MG PO TABS: 20.0000 mg | ORAL_TABLET | Freq: Every day | ORAL | Status: AC

## 2013-02-22 MED ORDER — ATORVASTATIN CALCIUM 20 MG PO TABS: 20.0000 mg | ORAL_TABLET | Freq: Every day | ORAL | Status: AC

## 2013-02-22 MED ORDER — ALPRAZOLAM 0.5 MG PO TABS: 0.5000 mg | ORAL_TABLET | Freq: Three times a day (TID) | ORAL | Status: DC | PRN

## 2013-02-22 NOTE — Discharge Summary (Signed)
  Discharge summary dictated on 02/22/2013 dictation number is [phone]

## 2013-02-22 NOTE — Evaluation (Signed)
Occupational Therapy Evaluation Patient Details Name: Mitchell [name] MRN: [id number] DOB: [birth year] Today's Date: 02/22/2013 Time: 8295-6213 OT Time Calculation (min): 18 min  OT Assessment / Plan / Recommendation History of present illness Pt adm with chest pain.  MI ruled out.  Pt also with UTI and confusion.   Clinical Impression   Pt presents w/ dx as above, he has deficits in ability to perform ADL and self care tasks. Pt lethargic and sleepy this am & no family present to assist w/ baseline level of function. Pt will benefit from acute OT followed by 24/7 A, HHOT vs SNF rehab if 24/7 A not available.    OT Assessment  Patient needs continued OT Services    Follow Up Recommendations  SNF;Other (comment);Home health OT;Supervision/Assistance - 24 hour (SNF if 24/7 A not available)    Barriers to Discharge      Equipment Recommendations  None recommended by OT    Recommendations for Other Services    Frequency  Min 2X/week    Precautions / Restrictions Precautions Precautions: Fall;Other (comment) (Contact/[place])   Pertinent Vitals/Pain No c/o    ADL  Eating/Feeding: Performed;Set up Where Assessed - Eating/Feeding: Bed level Grooming: Simulated;Set up Where Assessed - Grooming: Unsupported sitting Upper Body Bathing: Simulated;Minimal assistance Where Assessed - Upper Body Bathing: Unsupported sitting Lower Body Bathing: Simulated;Moderate assistance Where Assessed - Lower Body Bathing: Supported sit to stand Upper Body Dressing: Simulated;Minimal assistance Where Assessed - Upper Body Dressing: Unsupported sitting Lower Body Dressing: Simulated;Moderate assistance Where Assessed - Lower Body Dressing: Supported sit to stand Toilet Transfer: Simulated;Minimal assistance (Pt stood w/ RW from EOB took several steps & SPT) Toilet Transfer Method: Sit to stand;Stand pivot Acupuncturist: Materials engineer and Hygiene:  Simulated;Minimal assistance Where Assessed - Engineer, mining and Hygiene: Standing Tub/Shower Transfer Method: Not assessed Equipment Used: Gait belt;Rolling walker Transfers/Ambulation Related to ADLs: Pt requires Max vc's for RW safety (lethargic), sequencing and hand placement. Overall Min A from EOB w/ UE assist. ADL Comments: Pt presents w/ deficits in ability to perform ADL and self care tasks. Pt lethargic and sleepy this am & no family present to assist w/ baseline level of function. Pt will benefit from acute OT followed by 24/7 A, HHOT vs SNF rehab if 24/7 A not available.    OT Diagnosis: Generalized weakness;Cognitive deficits  OT Problem List: Decreased strength;Decreased activity tolerance;Impaired balance (sitting and/or standing);Decreased knowledge of precautions;Decreased knowledge of use of DME or AE;Decreased safety awareness;Decreased cognition;Pain OT Treatment Interventions: Self-care/ADL training;Therapeutic exercise;DME and/or AE instruction;Patient/family education;Cognitive remediation/compensation;Therapeutic activities;Balance training   OT Goals(Current goals can be found in the care plan section) Acute Rehab OT Goals Patient Stated Goal: Pt unable to state. Time For Goal Achievement: 03/08/13 Potential to Achieve Goals: Good  Visit Information  Last OT Received On: 02/22/13 Assistance Needed: +1 History of Present Illness: Pt adm with chest pain.  MI ruled out.  Pt also with UTI and confusion.       Prior Functioning     Home Living Family/patient expects to be discharged to:: Private residence Living Arrangements: Spouse/significant other Additional Comments: Pt unable to answer above questions at this time. Prior Function Comments: Pt unable to answer at this time. Dominant Hand: Right   Vision/Perception Vision - History Baseline Vision: Other (comment) (Pt unable to state, no family present)   Cognition   Cognition Arousal/Alertness: Lethargic Behavior During Therapy: Flat affect Overall Cognitive Status: No family/caregiver present to determine  baseline cognitive functioning    Extremity/Trunk Assessment Upper Extremity Assessment Upper Extremity Assessment: Generalized weakness Lower Extremity Assessment Lower Extremity Assessment: Generalized weakness     Mobility Bed Mobility Bed Mobility: Supine to Sit;Sit to Sidelying Left Supine to Sit: 4: Min assist;HOB elevated Sit to Sidelying Left: 4: Min assist;HOB flat Details for Bed Mobility Assistance: Assit to bring trunk up into sitting and to return legs to  bed.  Verbal/tactile cues to help pt initiated activity. Transfers Transfers: Sit to Stand;Stand to Sit Sit to Stand: 4: Min assist;With upper extremity assist;From bed;With armrests;From chair/3-in-1 Stand to Sit: 4: Min guard;With upper extremity assist;With armrests;To chair/3-in-1;To bed Details for Transfer Assistance: Assist for balance. VC's for safety, sequencing and hand placement w/ RW.        Balance Balance Balance Assessed: Yes Static Sitting Balance Static Sitting - Balance Support: Bilateral upper extremity supported;Feet supported Static Standing Balance Static Standing - Balance Support: Bilateral upper extremity supported Static Standing - Level of Assistance: 4: Min assist   End of Session OT - End of Session Equipment Utilized During Treatment: Gait belt;Rolling walker Activity Tolerance: Patient limited by fatigue;Patient limited by lethargy Patient left: in bed;with call bell/phone within reach;with nursing/sitter in room Nurse Communication: Mobility status  GO     Roselie Awkward Dixon 02/22/2013, 9:07 AM

## 2013-02-22 NOTE — Progress Notes (Signed)
Discharge instructions and prescriptions given to wife.  No questions asked, verbalized understanding.  Left via wheelchair with spouse. Rubye Oaks

## 2013-02-23 LAB — URINE CULTURE
Colony Count: NO GROWTH
Culture: NO GROWTH

## 2013-02-23 NOTE — Discharge Summary (Signed)
NAMELARICO, Mitchell NO.:  [id number]  MEDICAL RECORD NO.:  [id number]  LOCATION:  [id number]                        FACILITY:  [facility]  PHYSICIAN:  Raekwon Winkowski N. Sharyn Lull, M.D. DATE OF BIRTH:  21-Mar-1946  DATE OF ADMISSION:  02/18/2013 DATE OF DISCHARGE:  02/22/2013                              DISCHARGE SUMMARY   ADMITTING DIAGNOSES: 1. Chest pain, rule out myocardial infarction. 2. Urinary tract infection. 3. Chronic obstructive pulmonary disease. 4. Coronary artery disease. 5. Chronic systolic congestive heart failure. 6. History of Chronic lymphocytic leukemia. 7. Atrial fibrillation. 8. Hypertension. 9. History of cancer of prostate.  FINAL DIAGNOSES: 1. Status post chest pain, myocardial infarction ruled out. 2. Resolving urinary tract infection. 3. Status post altered mental status. 4. Hypertension. 5. Chronic atrial fibrillation. 6. Chronic obstructive pulmonary disease. 7. Compensated systolic heart failure. 8. History of tobacco abuse. 9. history of cancer of prostate. 10.History of chronic lymphocytic leukemia. 11.History of urinary retention secondary to bladder neck obstruction     in the past. 12.Irritable bowel syndrome. 13.Depression. 14.History of tobacco abuse.  MEDICATIONS:  His discharged home medications are: 1. Carvedilol 12.5 mg 1 tablet twice daily. 2. Levofloxacin 500 mg 1 tablet daily for 7 days. 3. Xarelto 20 mg 1 tablet daily. 4. Xanax 0.5 mg 1 tablet 3 times daily. 5. Aldactone 25 mg 1 tablet daily. 6. Advair 250/50 mcg per dose 1 puff twice daily as before. 7. Albuterol inhaler every 6 hours as before. 8. Atorvastatin 20 mg 1 tablet daily as before. 9. Citalopram 20 mg daily. 10.Digoxin 0.25 mg daily. 11.Colace 100-mg capsule as needed for constipation. 12.Lasix 40 mg twice daily. 13.Gleevec 200 mg daily as before. 14.DuoNeb nebulizer every 4 hours as needed. 15.Lisinopril 40 mg 1 tablet daily. 16.Elocon cream apply  locally as needed as before. 17.MiraLax 17 g by mouth as needed for constipation. 18.Potassium chloride 10 mEq daily. 19.Effexor 75 mg twice daily as before. 20.The patient has been advised to stop aspirin, Cipro, diltiazem,     metoprolol, tramadol, and Ambien.  FOLLOWUP:  Follow up with me in 1 week.  CONDITION AT DISCHARGE:  Stable.  BRIEF HISTORY AND HOSPITAL COURSE:  Mitchell Mitchell [name] is a 67 year old male with past medical history significant for mild coronary artery disease, chronic systolic heart failure, COPD, chronic atrial fibrillation, depression, history of cancer of prostate, history of urinary retention secondary to bladder neck obstruction in the past, depression, history of tobacco abuse, history of CLL.  He came to the ER and was admitted by [facility] because of chest pain and shortness of breath that started on Friday evening.  He was evaluated in the ED and was found to have negative troponin I and due to history, he was referred for medical evaluation.  The patient was confused at this time and did not give any history during the time of admission.  PAST MEDICAL HISTORY:  As above.  PHYSICAL EXAMINATION:  VITAL SIGNS:  As per chart review, his blood pressure was 128/82, pulse was 85.  He was afebrile. GENERAL:  He was alert and oriented x1, does not appear anxious, does not appear depressed.  Affect is normal. HEENT:  Normocephalic, atraumatic.  PERRLA. CHEST:  Chest wall, no tenderness.  Lungs are clear to auscultation bilaterally. HEART:  Irregularly irregular.  No murmur or gallop. ABDOMEN:  Positive bowel sounds. EXTREMITIES:  No cyanosis, clubbing, or edema.  Pedal pulses were 2+. NEUROLOGIC:  Grossly intact.  LABORATORY DATA:  Sodium was 137, potassium 4.7, BUN 28, creatinine 1.21.  ProBNP was elevated at 2611.  Troponin I 2 sets were negative. His urine culture was positive for Enterococcus which is sensitive to levofloxacin.  Blood cultures have  been negative so far.  BRIEF HOSPITAL COURSE:  The patient was admitted to telemetry unit.  MI was ruled out by serial enzymes and EKG.  The patient was subsequently transferred by [facility] to my service.  The patient did not have any episodes of chest pain during the hospital stay.  His home medications were resumed.  The patient was also restarted on Xarelto. The patient did not have any episodes of bleeding during the hospital stay.  Urine culture as stated Enterococcus which is sensitive to levofloxacin.  Discussed with the patient's wife regarding discharge, she stated the patient was not taking his medications as prescribed at home.  The patient will be discharged home on above medications and will be followed up in my office in 1 week.  The patient has also appointment to see urologist as an outpatient for his bladder neck obstruction.     [name]. Sharyn Lull, M.D.     [facility]/MED[facility]  D:  02/22/2013  T:  02/23/2013  Job:  [id number]

## 2013-02-25 LAB — CULTURE, BLOOD (ROUTINE X 2): Culture: NO GROWTH

## 2013-03-03 ENCOUNTER — Encounter (HOSPITAL_COMMUNITY): Payer: Self-pay | Admitting: Emergency Medicine

## 2013-03-03 ENCOUNTER — Emergency Department (HOSPITAL_COMMUNITY): Payer: Medicare Other

## 2013-03-03 ENCOUNTER — Inpatient Hospital Stay (HOSPITAL_COMMUNITY)
Admission: EM | Admit: 2013-03-03 | Discharge: 2013-03-06 | DRG: 308 | Disposition: A | Discharge status: Home Health | Payer: Medicare Other | Attending: Internal Medicine | Admitting: Internal Medicine

## 2013-03-03 DIAGNOSIS — Z7901 Long term (current) use of anticoagulants: Secondary | ICD-10-CM

## 2013-03-03 DIAGNOSIS — I2510 Atherosclerotic heart disease of native coronary artery without angina pectoris: Secondary | ICD-10-CM

## 2013-03-03 DIAGNOSIS — F329 Major depressive disorder, single episode, unspecified: Secondary | ICD-10-CM | POA: Diagnosis present

## 2013-03-03 DIAGNOSIS — E669 Obesity, unspecified: Secondary | ICD-10-CM

## 2013-03-03 DIAGNOSIS — F0390 Unspecified dementia without behavioral disturbance: Secondary | ICD-10-CM | POA: Diagnosis present

## 2013-03-03 DIAGNOSIS — K589 Irritable bowel syndrome without diarrhea: Secondary | ICD-10-CM

## 2013-03-03 DIAGNOSIS — F17200 Nicotine dependence, unspecified, uncomplicated: Secondary | ICD-10-CM | POA: Diagnosis present

## 2013-03-03 DIAGNOSIS — I519 Heart disease, unspecified: Secondary | ICD-10-CM

## 2013-03-03 DIAGNOSIS — J4489 Other specified chronic obstructive pulmonary disease: Secondary | ICD-10-CM | POA: Diagnosis present

## 2013-03-03 DIAGNOSIS — K5900 Constipation, unspecified: Secondary | ICD-10-CM

## 2013-03-03 DIAGNOSIS — I4891 Unspecified atrial fibrillation: Principal | ICD-10-CM | POA: Diagnosis present

## 2013-03-03 DIAGNOSIS — I509 Heart failure, unspecified: Secondary | ICD-10-CM

## 2013-03-03 DIAGNOSIS — N179 Acute kidney failure, unspecified: Secondary | ICD-10-CM

## 2013-03-03 DIAGNOSIS — N390 Urinary tract infection, site not specified: Secondary | ICD-10-CM

## 2013-03-03 DIAGNOSIS — Z8249 Family history of ischemic heart disease and other diseases of the circulatory system: Secondary | ICD-10-CM

## 2013-03-03 DIAGNOSIS — Z79899 Other long term (current) drug therapy: Secondary | ICD-10-CM

## 2013-03-03 DIAGNOSIS — T502X5A Adverse effect of carbonic-anhydrase inhibitors, benzothiadiazides and other diuretics, initial encounter: Secondary | ICD-10-CM | POA: Diagnosis present

## 2013-03-03 DIAGNOSIS — F05 Delirium due to known physiological condition: Secondary | ICD-10-CM

## 2013-03-03 DIAGNOSIS — I5023 Acute on chronic systolic (congestive) heart failure: Secondary | ICD-10-CM | POA: Diagnosis present

## 2013-03-03 DIAGNOSIS — I5189 Other ill-defined heart diseases: Secondary | ICD-10-CM

## 2013-03-03 DIAGNOSIS — R338 Other retention of urine: Secondary | ICD-10-CM

## 2013-03-03 DIAGNOSIS — R079 Chest pain, unspecified: Secondary | ICD-10-CM

## 2013-03-03 DIAGNOSIS — I129 Hypertensive chronic kidney disease with stage 1 through stage 4 chronic kidney disease, or unspecified chronic kidney disease: Secondary | ICD-10-CM | POA: Diagnosis present

## 2013-03-03 DIAGNOSIS — R32 Unspecified urinary incontinence: Secondary | ICD-10-CM

## 2013-03-03 DIAGNOSIS — N320 Bladder-neck obstruction: Secondary | ICD-10-CM | POA: Diagnosis present

## 2013-03-03 DIAGNOSIS — Z823 Family history of stroke: Secondary | ICD-10-CM

## 2013-03-03 DIAGNOSIS — Z8546 Personal history of malignant neoplasm of prostate: Secondary | ICD-10-CM

## 2013-03-03 DIAGNOSIS — Z862 Personal history of diseases of the blood and blood-forming organs and certain disorders involving the immune mechanism: Secondary | ICD-10-CM

## 2013-03-03 DIAGNOSIS — F411 Generalized anxiety disorder: Secondary | ICD-10-CM

## 2013-03-03 DIAGNOSIS — Z9119 Patient's noncompliance with other medical treatment and regimen: Secondary | ICD-10-CM

## 2013-03-03 DIAGNOSIS — N189 Chronic kidney disease, unspecified: Secondary | ICD-10-CM | POA: Diagnosis present

## 2013-03-03 DIAGNOSIS — I10 Essential (primary) hypertension: Secondary | ICD-10-CM

## 2013-03-03 DIAGNOSIS — J449 Chronic obstructive pulmonary disease, unspecified: Secondary | ICD-10-CM

## 2013-03-03 DIAGNOSIS — I5022 Chronic systolic (congestive) heart failure: Secondary | ICD-10-CM

## 2013-03-03 DIAGNOSIS — Z5181 Encounter for therapeutic drug level monitoring: Secondary | ICD-10-CM

## 2013-03-03 DIAGNOSIS — R0609 Other forms of dyspnea: Secondary | ICD-10-CM

## 2013-03-03 DIAGNOSIS — Z91199 Patient's noncompliance with other medical treatment and regimen due to unspecified reason: Secondary | ICD-10-CM

## 2013-03-03 DIAGNOSIS — F3289 Other specified depressive episodes: Secondary | ICD-10-CM

## 2013-03-03 DIAGNOSIS — R0600 Dyspnea, unspecified: Secondary | ICD-10-CM

## 2013-03-03 DIAGNOSIS — I252 Old myocardial infarction: Secondary | ICD-10-CM

## 2013-03-03 DIAGNOSIS — J40 Bronchitis, not specified as acute or chronic: Secondary | ICD-10-CM

## 2013-03-03 DIAGNOSIS — C9110 Chronic lymphocytic leukemia of B-cell type not having achieved remission: Secondary | ICD-10-CM

## 2013-03-03 LAB — TROPONIN I: Troponin I: <0.3 ng/mL (ref <0.30)

## 2013-03-03 LAB — BASIC METABOLIC PANEL
BUN: 14 mg/dL (ref 6–23)
CO2: 26 mEq/L (ref 19–32)
GFR calc Af Amer: 79 mL/min — ABNORMAL LOW (ref >90)
GFR calc non Af Amer: 68 mL/min — ABNORMAL LOW (ref >90)
Glucose, Bld: 98 mg/dL (ref 70–99)
Potassium: 3.8 mEq/L (ref 3.5–5.1)
Sodium: 141 mEq/L (ref 135–145)

## 2013-03-03 LAB — CBC WITH DIFFERENTIAL/PLATELET
Basophils Relative: 1 % (ref 0–1)
Eosinophils Absolute: 0.3 10*3/uL (ref 0.0–0.7)
Hemoglobin: 12.4 g/dL — ABNORMAL LOW (ref 13.0–17.0)
Lymphocytes Relative: 27 % (ref 12–46)
Lymphs Abs: 2.2 10*3/uL (ref 0.7–4.0)
MCH: 27 pg (ref 26.0–34.0)
MCHC: 30.8 g/dL (ref 30.0–36.0)
MCV: 87.6 fL (ref 78.0–100.0)
Monocytes Relative: 7 % (ref 3–12)
Neutrophils Relative %: 61 % (ref 43–77)
Platelets: 289 10*3/uL (ref 150–400)
RBC: 4.59 MIL/uL (ref 4.22–5.81)
RDW: 15.3 % (ref 11.5–15.5)
WBC: 8.1 10*3/uL (ref 4.0–10.5)

## 2013-03-03 LAB — URINE MICROSCOPIC-ADD ON

## 2013-03-03 LAB — URINALYSIS, ROUTINE W REFLEX MICROSCOPIC
Nitrite: NEGATIVE
Protein, ur: NEGATIVE mg/dL
Specific Gravity, Urine: 1.02 (ref 1.005–1.030)
Urobilinogen, UA: 0.2 mg/dL (ref 0.0–1.0)

## 2013-03-03 LAB — PRO B NATRIURETIC PEPTIDE: Pro B Natriuretic peptide (BNP): 3415 pg/mL — ABNORMAL HIGH (ref 0–125)

## 2013-03-03 LAB — DIGOXIN LEVEL: Digoxin Level: <0.3 ng/mL — ABNORMAL LOW (ref 0.8–2.0)

## 2013-03-03 LAB — D-DIMER, QUANTITATIVE: D-Dimer, Quant: 0.36 ug/mL-FEU (ref 0.00–0.48)

## 2013-03-03 MED ORDER — SODIUM CHLORIDE 0.9 % IJ SOLN: 3.0000 mL | Freq: Two times a day (BID) | INTRAMUSCULAR | Status: DC

## 2013-03-03 MED ORDER — ALBUTEROL SULFATE (5 MG/ML) 0.5% IN NEBU: 2.5000 mg | INHALATION_SOLUTION | RESPIRATORY_TRACT | Status: DC | PRN

## 2013-03-03 MED ORDER — CARVEDILOL 12.5 MG PO TABS: 12.5000 mg | ORAL_TABLET | Freq: Two times a day (BID) | ORAL | Status: DC

## 2013-03-03 MED ORDER — ACETAMINOPHEN 650 MG RE SUPP
650.0000 mg | Freq: Four times a day (QID) | RECTAL | Status: DC | PRN
  Filled 2013-03-03 (×2): qty 1

## 2013-03-03 MED ORDER — VENLAFAXINE HCL 37.5 MG PO TABS: 75.0000 mg | ORAL_TABLET | Freq: Two times a day (BID) | ORAL | Status: DC

## 2013-03-03 MED ORDER — SODIUM CHLORIDE 0.9 % IJ SOLN
3.0000 mL | Freq: Two times a day (BID) | INTRAMUSCULAR | Status: DC
  Administered 2013-03-04 – 2013-03-06 (×4): 3 mL via INTRAVENOUS

## 2013-03-03 MED ORDER — POTASSIUM CHLORIDE CRYS ER 10 MEQ PO TBCR
10.0000 meq | EXTENDED_RELEASE_TABLET | Freq: Every day | ORAL | Status: DC
  Administered 2013-03-04 – 2013-03-05 (×2): 10 meq via ORAL
  Filled 2013-03-03 (×2): qty 1

## 2013-03-03 MED ORDER — MOMETASONE FURO-FORMOTEROL FUM 100-5 MCG/ACT IN AERO
INHALATION_SPRAY | RESPIRATORY_TRACT | Status: AC
  Filled 2013-03-03: qty 8.8

## 2013-03-03 MED ORDER — FUROSEMIDE 40 MG PO TABS
40.0000 mg | ORAL_TABLET | Freq: Two times a day (BID) | ORAL | Status: DC
  Administered 2013-03-04: 40 mg via ORAL
  Filled 2013-03-03: qty 2

## 2013-03-03 MED ORDER — ONDANSETRON HCL 4 MG/2ML IJ SOLN: 4.0000 mg | Freq: Four times a day (QID) | INTRAMUSCULAR | Status: DC | PRN

## 2013-03-03 MED ORDER — MORPHINE SULFATE 2 MG/ML IJ SOLN
2.0000 mg | INTRAMUSCULAR | Status: DC | PRN
  Administered 2013-03-04 – 2013-03-05 (×2): 2 mg via INTRAVENOUS
  Filled 2013-03-03 (×2): qty 1

## 2013-03-03 MED ORDER — METOPROLOL TARTRATE 1 MG/ML IV SOLN
5.0000 mg | Freq: Once | INTRAVENOUS | Status: AC
  Administered 2013-03-03: 5 mg via INTRAVENOUS
  Filled 2013-03-03: qty 5

## 2013-03-03 MED ORDER — RIVAROXABAN 10 MG PO TABS
20.0000 mg | ORAL_TABLET | Freq: Every day | ORAL | Status: DC
  Administered 2013-03-04: 20 mg via ORAL
  Filled 2013-03-03 (×4): qty 1

## 2013-03-03 MED ORDER — SODIUM CHLORIDE 0.9 % IV SOLN: 250.0000 mL | INTRAVENOUS | Status: DC | PRN

## 2013-03-03 MED ORDER — ONDANSETRON HCL 4 MG PO TABS
4.0000 mg | ORAL_TABLET | Freq: Four times a day (QID) | ORAL | Status: DC | PRN
  Administered 2013-03-04: 4 mg via ORAL
  Filled 2013-03-03: qty 1

## 2013-03-03 MED ORDER — IMATINIB MESYLATE 100 MG PO TABS
200.0000 mg | ORAL_TABLET | Freq: Every day | ORAL | Status: DC
  Administered 2013-03-04 – 2013-03-06 (×3): 200 mg via ORAL
  Filled 2013-03-03 (×4): qty 2

## 2013-03-03 MED ORDER — ALBUTEROL SULFATE (5 MG/ML) 0.5% IN NEBU
5.0000 mg | INHALATION_SOLUTION | Freq: Once | RESPIRATORY_TRACT | Status: AC
  Administered 2013-03-03: 5 mg via RESPIRATORY_TRACT
  Filled 2013-03-03: qty 1

## 2013-03-03 MED ORDER — CARVEDILOL 12.5 MG PO TABS
ORAL_TABLET | ORAL | Status: AC
  Filled 2013-03-03: qty 1

## 2013-03-03 MED ORDER — ACETAMINOPHEN 325 MG PO TABS: 650.0000 mg | ORAL_TABLET | Freq: Once | ORAL | Status: DC

## 2013-03-03 MED ORDER — SODIUM CHLORIDE 0.9 % IJ SOLN: 3.0000 mL | INTRAMUSCULAR | Status: DC | PRN

## 2013-03-03 MED ORDER — FUROSEMIDE 10 MG/ML IJ SOLN
40.0000 mg | Freq: Once | INTRAMUSCULAR | Status: AC
  Administered 2013-03-03: 40 mg via INTRAVENOUS
  Filled 2013-03-03: qty 4

## 2013-03-03 MED ORDER — MOMETASONE FURO-FORMOTEROL FUM 100-5 MCG/ACT IN AERO
2.0000 | INHALATION_SPRAY | Freq: Two times a day (BID) | RESPIRATORY_TRACT | Status: DC
  Administered 2013-03-03 – 2013-03-06 (×6): 2 via RESPIRATORY_TRACT
  Filled 2013-03-03: qty 8.8

## 2013-03-03 MED ORDER — DIGOXIN 125 MCG PO TABS
0.2500 mg | ORAL_TABLET | Freq: Every day | ORAL | Status: DC
  Administered 2013-03-04: 0.25 mg via ORAL
  Filled 2013-03-03: qty 2

## 2013-03-03 MED ORDER — ATORVASTATIN CALCIUM 20 MG PO TABS
20.0000 mg | ORAL_TABLET | Freq: Every day | ORAL | Status: DC
  Administered 2013-03-04 – 2013-03-05 (×3): 20 mg via ORAL
  Filled 2013-03-03 (×3): qty 1

## 2013-03-03 MED ORDER — LEVOFLOXACIN 500 MG PO TABS: 500.0000 mg | ORAL_TABLET | Freq: Every day | ORAL | Status: DC

## 2013-03-03 MED ORDER — ALPRAZOLAM 0.5 MG PO TABS
0.5000 mg | ORAL_TABLET | Freq: Three times a day (TID) | ORAL | Status: DC | PRN
  Administered 2013-03-03 – 2013-03-04 (×2): 0.5 mg via ORAL
  Filled 2013-03-03 (×3): qty 1

## 2013-03-03 MED ORDER — POLYETHYLENE GLYCOL 3350 17 G PO PACK
17.0000 g | PACK | Freq: Every day | ORAL | Status: DC | PRN
  Administered 2013-03-05: 17 g via ORAL
  Filled 2013-03-03: qty 1

## 2013-03-03 MED ORDER — CARVEDILOL 12.5 MG PO TABS
12.5000 mg | ORAL_TABLET | Freq: Two times a day (BID) | ORAL | Status: DC
  Administered 2013-03-04 – 2013-03-06 (×6): 12.5 mg via ORAL
  Filled 2013-03-03 (×5): qty 1

## 2013-03-03 MED ORDER — ATORVASTATIN CALCIUM 20 MG PO TABS: 20.0000 mg | ORAL_TABLET | Freq: Every day | ORAL | Status: DC

## 2013-03-03 MED ORDER — LISINOPRIL 10 MG PO TABS
40.0000 mg | ORAL_TABLET | Freq: Every day | ORAL | Status: DC
  Administered 2013-03-04 – 2013-03-05 (×2): 40 mg via ORAL
  Filled 2013-03-03 (×2): qty 4

## 2013-03-03 MED ORDER — ACETAMINOPHEN 325 MG PO TABS
650.0000 mg | ORAL_TABLET | Freq: Four times a day (QID) | ORAL | Status: DC | PRN
  Administered 2013-03-04: 650 mg via ORAL
  Filled 2013-03-03: qty 2

## 2013-03-03 MED ORDER — CITALOPRAM HYDROBROMIDE 20 MG PO TABS
20.0000 mg | ORAL_TABLET | Freq: Every day | ORAL | Status: DC
  Administered 2013-03-04: 20 mg via ORAL
  Filled 2013-03-03: qty 1

## 2013-03-03 MED ORDER — SPIRONOLACTONE 25 MG PO TABS
25.0000 mg | ORAL_TABLET | Freq: Every day | ORAL | Status: DC
  Administered 2013-03-04 – 2013-03-06 (×3): 25 mg via ORAL
  Filled 2013-03-03 (×3): qty 1

## 2013-03-03 MED ORDER — ONDANSETRON HCL 4 MG/2ML IJ SOLN
4.0000 mg | Freq: Once | INTRAMUSCULAR | Status: AC
  Administered 2013-03-03: 4 mg via INTRAVENOUS
  Filled 2013-03-03: qty 2

## 2013-03-03 MED ORDER — LEVOFLOXACIN 500 MG PO TABS
500.0000 mg | ORAL_TABLET | Freq: Every day | ORAL | Status: DC
  Administered 2013-03-04 (×2): 500 mg via ORAL
  Filled 2013-03-03: qty 1
  Filled 2013-03-03: qty 2

## 2013-03-03 MED ORDER — IPRATROPIUM BROMIDE 0.02 % IN SOLN
0.5000 mg | Freq: Once | RESPIRATORY_TRACT | Status: AC
  Administered 2013-03-03: 0.5 mg via RESPIRATORY_TRACT
  Filled 2013-03-03: qty 2.5

## 2013-03-03 MED ORDER — RIVAROXABAN 10 MG PO TABS
ORAL_TABLET | ORAL | Status: AC
  Filled 2013-03-03: qty 2

## 2013-03-03 NOTE — H&P (Addendum)
[facility] History and Physical  Mitchell [name]:[id number] DOB: Aug 18, 1945 DOA: 03/03/2013   PCP: Toma Deiters, MD  Specialists: Dr. Sharyn Lull is his cardiologist  Chief Complaint: Chest pain, pain with urination, shortness of breath  HPI: Mitchell [name] is a 67 y.o. male with a past medical history of atrial fibrillation on anticoagulation, systolic CHF, with a known EF of about 25-30%, who is a very poor historian. Patient appears to have some degree of cognitive, impairment and could have dementia. He had forgotten why he came in to the hospital. He was complaining of pain in his penis area. He, apparently, self catheterizes, and they had some difficulty placing his Foley in the emergency department. Apparently, he came in because he had been short of breath. This has been present for the last few weeks. He also is complaining of left-sided chest pain. And, hence decided to come in. He was recently hospitalized, in October for CHF exacerbation. He was also hospitalized in September for a non-ST elevation MI. Cardiac catheterization did not show any critical lesions. Currently, patient denies any nausea, vomiting. He still having a little bit of chest pain, but unable to quantify, unspecified. Denies any leg swelling. Denies any wheezing. No cough. Basically, very poor historian. He was unable to answer most of my questions.  Home Medications: Prior to Admission medications   Medication Sig Start Date End Date Taking? Authorizing Provider  albuterol (PROVENTIL HFA;VENTOLIN HFA) 108 (90 BASE) MCG/ACT inhaler Inhale 2 puffs into the lungs every 6 (six) hours as needed. For shortness of breath     Historical Provider, MD  ALPRAZolam (XANAX) 0.5 MG tablet Take 1 tablet (0.5 mg total) by mouth 3 (three) times daily as needed for sleep. 02/22/13   Robynn Pane, MD  atorvastatin (LIPITOR) 20 MG tablet Take 1 tablet (20 mg total) by mouth daily at 6 PM. 02/22/13   Robynn Pane, MD   carvedilol (COREG) 12.5 MG tablet Take 1 tablet (12.5 mg total) by mouth 2 (two) times daily with a meal. 02/22/13   Robynn Pane, MD  citalopram (CELEXA) 40 MG tablet Take 0.5 tablets (20 mg total) by mouth daily. 01/05/13   Robynn Pane, MD  digoxin (LANOXIN) 0.25 MG tablet Take 0.25 mg by mouth daily.    Nimish Normajean Glasgow, MD  docusate sodium (COLACE) 100 MG capsule Take 100 mg by mouth as needed. For constipation     Historical Provider, MD  Fluticasone-Salmeterol (ADVAIR DISKUS) 250-50 MCG/DOSE AEPB Inhale 1 puff into the lungs every 12 (twelve) hours.      Historical Provider, MD  furosemide (LASIX) 40 MG tablet Take 40 mg by mouth 2 (two) times daily.    Historical Provider, MD  imatinib (GLEEVEC) 100 MG tablet Take 200 mg by mouth daily. Take with meals and large glass of water.Caution:Chemotherapy    Historical Provider, MD  ipratropium-albuterol (DUONEB) 0.5-2.5 (3) MG/3ML SOLN Take 3 mLs by nebulization every 4 (four) hours as needed (shortness of breath).    Historical Provider, MD  levofloxacin (LEVAQUIN) 500 MG tablet Take 1 tablet (500 mg total) by mouth daily. 02/22/13   Robynn Pane, MD  lisinopril (PRINIVIL,ZESTRIL) 40 MG tablet Take 40 mg by mouth daily.    Historical Provider, MD  metoprolol succinate (TOPROL-XL) 50 MG 24 hr tablet Take 50 mg by mouth daily. 02/16/13   Historical Provider, MD  mometasone (ELOCON) 0.1 % cream Apply 1 application topically daily as needed. Skin lesions  Historical Provider, MD  polyethylene glycol (MIRALAX / GLYCOLAX) packet Take 17 g by mouth daily as needed (constipation).    Historical Provider, MD  potassium chloride (K-DUR,KLOR-CON) 10 MEQ tablet Take 10 mEq by mouth daily.    Historical Provider, MD  Rivaroxaban (XARELTO) 20 MG TABS tablet Take 1 tablet (20 mg total) by mouth daily with supper. 02/22/13   Robynn Pane, MD  spironolactone (ALDACTONE) 25 MG tablet Take 1 tablet (25 mg total) by mouth daily. 02/22/13   Robynn Pane, MD  traMADol (ULTRAM) 50 MG tablet Take 50 mg by mouth 3 (three) times daily as needed. 02/11/13   Historical Provider, MD  venlafaxine (EFFEXOR) 75 MG tablet Take 75 mg by mouth 2 (two) times daily.    Historical Provider, MD    Allergies: No Known Allergies  Past Medical History: Past Medical History  Diagnosis Date  . COPD (chronic obstructive pulmonary disease)   . Hypertension   . Pneumonia   . Systolic heart failure   . AF (atrial fibrillation) 08/21/2011  . Anxiety   . History of kidney stones   . Cancer   . Prostate cancer   . Leukemia   . Leukemia   . Chronic kidney disease     Past Surgical History  Procedure Laterality Date  . Colon surgery    . Prostate surgery  1999  . Transurethral resection of bladder tumor  08/25/2011    Procedure: TRANSURETHRAL RESECTION OF BLADDER TUMOR (TURBT);  Surgeon: Ky Barban, MD;  Location: [facility];  Service: Urology;  Laterality: N/A;  Transurethral Resection of Bladder Neck  . Cystoscopy with urethral dilatation  12/30/2011    Procedure: CYSTOSCOPY WITH URETHRAL DILATATION;  Surgeon: Ky Barban, MD;  Location: [facility];  Service: Urology;;  . [place]  12/30/2011    Procedure: CYSTOSCOPY/URETHROTOMY;  Surgeon: Ky Barban, MD;  Location: [facility];  Service: Urology;;  . Foreign body removal  12/30/2011    Procedure: FOREIGN BODY REMOVAL;  Surgeon: Ky Barban, MD;  Location: [facility];  Service: Urology;;  Transurethral Removal of 4 clips from bladder neck  . Partial colectomy  2012    [facility]  . Transurethral resection of bladder neck  07/27/2012    Procedure: TRANSURETHRAL RESECTION OF BLADDER NECK;  Surgeon: Ky Barban, MD;  Location: [facility];  Service: Urology;;  . Cystoscopy with urethral dilatation  07/27/2012    Procedure: CYSTOSCOPY WITH URETHRAL DILATATION;  Surgeon: Ky Barban, MD;  Location: [facility];  Service: Urology;;  . Foreign body removal  07/27/2012    Procedure: FOREIGN BODY  REMOVAL-CLIPS BLADDER NECK;  Surgeon: Ky Barban, MD;  Location: [facility];  Service: Urology;;  . Cataract extraction w/phaco Right 10/04/2012    Procedure: CATARACT EXTRACTION PHACO AND INTRAOCULAR LENS PLACEMENT (IOC);  Surgeon: Gemma Payor, MD;  Location: [facility];  Service: Ophthalmology;  Laterality: Right;  CDE:12.16  . Eye surgery    . Cataract extraction w/phaco Left 10/18/2012    Procedure: CATARACT EXTRACTION PHACO AND INTRAOCULAR LENS PLACEMENT (IOC);  Surgeon: Gemma Payor, MD;  Location: [facility];  Service: Ophthalmology;  Laterality: Left;  CDE:14.42    Social History: Lives with his wife. Still smokes, but unknown quantity. No history of alcohol use or illicit drug use. Activity level is unknown  Family History:  Family History  Problem Relation Age of Onset  . Heart attack Mother   . Cancer Father   . Stroke Father  Review of Systems - unable to obtain due to his confusion  Physical Examination  Filed Vitals:   03/03/13 1900 03/03/13 2000 03/03/13 2009 03/03/13 2124  BP: 158/110 129/94  113/66  Pulse:    118  Temp:    97.3 F (36.3 C)  TempSrc:    Oral  Resp: 21 22  21   Height:      Weight:      SpO2:   95% 99%    General appearance: alert, cooperative, appears stated age and no distress Head: Normocephalic, without obvious abnormality, atraumatic Eyes: conjunctivae/corneas clear. PERRL, EOM's intact.  Throat: lips, mucosa, and tongue normal; teeth and gums normal Neck: no adenopathy, no carotid bruit, no JVD, supple, symmetrical, trachea midline and thyroid not enlarged, symmetric, no tenderness/mass/nodules Resp: clear to auscultation bilaterally Cardio: Irregularly irregular and tachycardic at 120. No S3, S4. No rubs, murmurs, or bruit. No pedal edema. No JVD. GI: soft, non-tender; bowel sounds normal; no masses,  no organomegaly Extremities: extremities normal, atraumatic, no cyanosis or edema Pulses: 2+ and symmetric Skin: Skin color, texture, turgor  normal. No rashes or lesions Lymph nodes: Cervical, supraclavicular, and axillary nodes normal. Neurologic: He is alert. No cranial nerve deficits. No focal neurological deficits appreciated.  Laboratory Data: Results for orders placed during the hospital encounter of 03/03/13 (from the past 48 hour(s))  CBC WITH DIFFERENTIAL     Status: Abnormal   Collection Time    03/03/13  4:56 PM      Result Value Range   WBC 8.1  4.0 - 10.5 K/uL   RBC 4.59  4.22 - 5.81 MIL/uL   Hemoglobin 12.4 (*) 13.0 - 17.0 g/dL   HCT 19.1  47.8 - 29.5 %   MCV 87.6  78.0 - 100.0 fL   MCH 27.0  26.0 - 34.0 pg   MCHC 30.8  30.0 - 36.0 g/dL   RDW 62.1  30.8 - 65.7 %   Platelets 289  150 - 400 K/uL   Neutrophils Relative % 61  43 - 77 %   Neutro Abs 4.9  1.7 - 7.7 K/uL   Lymphocytes Relative 27  12 - 46 %   Lymphs Abs 2.2  0.7 - 4.0 K/uL   Monocytes Relative 7  3 - 12 %   Monocytes Absolute 0.6  0.1 - 1.0 K/uL   Eosinophils Relative 4  0 - 5 %   Eosinophils Absolute 0.3  0.0 - 0.7 K/uL   Basophils Relative 1  0 - 1 %   Basophils Absolute 0.1  0.0 - 0.1 K/uL  BASIC METABOLIC PANEL     Status: Abnormal   Collection Time    03/03/13  4:56 PM      Result Value Range   Sodium 141  135 - 145 mEq/L   Potassium 3.8  3.5 - 5.1 mEq/L   Chloride 108  96 - 112 mEq/L   CO2 26  19 - 32 mEq/L   Glucose, Bld 98  70 - 99 mg/dL   BUN 14  6 - 23 mg/dL   Creatinine, Ser 8.46  0.50 - 1.35 mg/dL   Calcium 9.4  8.4 - 96.2 mg/dL   GFR calc non Af Amer 68 (*) >90 mL/min   GFR calc Af Amer 79 (*) >90 mL/min   Comment: (NOTE)     The eGFR has been calculated using the CKD EPI equation.     This calculation has not been validated in all clinical situations.  eGFR's persistently <90 mL/min signify possible Chronic Kidney     Disease.  TROPONIN I     Status: None   Collection Time    03/03/13  4:56 PM      Result Value Range   Troponin I <0.30  <0.30 ng/mL   Comment:            Due to the release kinetics of cTnI,      a negative result within the first hours     of the onset of symptoms does not rule out     myocardial infarction with certainty.     If myocardial infarction is still suspected,     repeat the test at appropriate intervals.  PRO B NATRIURETIC PEPTIDE     Status: Abnormal   Collection Time    03/03/13  4:56 PM      Result Value Range   Pro B Natriuretic peptide (BNP) 3415.0 (*) 0 - 125 pg/mL  D-DIMER, QUANTITATIVE     Status: None   Collection Time    03/03/13  4:56 PM      Result Value Range   D-Dimer, Quant 0.36  0.00 - 0.48 ug/mL-FEU   Comment:            AT THE INHOUSE ESTABLISHED CUTOFF     VALUE OF 0.48 ug/mL FEU,     THIS ASSAY HAS BEEN DOCUMENTED     IN THE LITERATURE TO HAVE     A SENSITIVITY AND NEGATIVE     PREDICTIVE VALUE OF AT LEAST     98 TO 99%.  THE TEST RESULT     SHOULD BE CORRELATED WITH     AN ASSESSMENT OF THE CLINICAL     PROBABILITY OF DVT / VTE.  DIGOXIN LEVEL     Status: Abnormal   Collection Time    03/03/13  4:56 PM      Result Value Range   Digoxin Level <0.3 (*) 0.8 - 2.0 ng/mL  URINALYSIS, ROUTINE W REFLEX MICROSCOPIC     Status: Abnormal   Collection Time    03/03/13  7:44 PM      Result Value Range   Color, Urine YELLOW  YELLOW   APPearance CLEAR  CLEAR   Specific Gravity, Urine 1.020  1.005 - 1.030   pH 6.0  5.0 - 8.0   Glucose, UA NEGATIVE  NEGATIVE mg/dL   Hgb urine dipstick MODERATE (*) NEGATIVE   Bilirubin Urine NEGATIVE  NEGATIVE   Ketones, ur NEGATIVE  NEGATIVE mg/dL   Protein, ur NEGATIVE  NEGATIVE mg/dL   Urobilinogen, UA 0.2  0.0 - 1.0 mg/dL   Nitrite NEGATIVE  NEGATIVE   Leukocytes, UA TRACE (*) NEGATIVE  URINE MICROSCOPIC-ADD ON     Status: Abnormal   Collection Time    03/03/13  7:44 PM      Result Value Range   Squamous Epithelial / LPF FEW (*) RARE   WBC, UA 21-50  <3 WBC/hpf   RBC / HPF 0-2  <3 RBC/hpf   Bacteria, UA FEW (*) RARE   Urine-Other MANY YEAST      Radiology Reports: Dg Chest Portable 1  View  03/03/2013   CLINICAL DATA:  Chest pain and shortness of breath  EXAM: PORTABLE CHEST - 1 VIEW  COMPARISON:  02/18/2013  FINDINGS: The cardiac shadow remains mildly enlarged. No focal infiltrate or sizable effusion is noted. No acute bony abnormality is seen.  IMPRESSION: No acute abnormality noted.  Electronically Signed   By: Alcide Clever M.D.   On: 03/03/2013 17:11    Electrocardiogram: Atrial fibrillation, and 100 beats per minute. Left axis deviation. Intraventricular conduction delay. No acute ST or T-wave changes are noted. Similar to EKG from October.  Problem List  Principal Problem:   Acute dyspnea Active Problems:   UTI (urinary tract infection)   COPD (chronic obstructive pulmonary disease)   Chronic systolic heart failure   Chest pain   Atrial fibrillation with RVR   Assessment: This is a 67 year old, Caucasian male, who presents with dyspnea, chest pain, and pain with urination. He was noted to have enterococcus in his urine back in October. It appears that he was treated with Levaquin. His UA remains abnormal even now. His chest x-ray did not show any acute findings. D-dimer was normal. ED physician thought that he had some wheezing at initial assessment. No wheezing was heard by myself. He was given Lasix with good diuresis and he is feeling better.   Plan: #1 chest pain with dyspnea: He'll be observed in the hospital and troponin will be trended. EKG will be repeated in the morning. Nebulizer treatments as needed. His lungs are clear at this time. I don't think that he needs further doses of IV Lasix. He can continue taking his oral Lasix from morning. If he rules out for acute coronary syndrome he should be able to go home tomorrow.  #2 atrial fibrillation with RVR: This is likely because he hasn't taken his home medications today. He'll be given his dose of beta blocker and his digoxin tonight. Continue with his anticoagulation.  #3 chronic systolic CHF: Known EF of  25-30% based on echo from 2013 and from recent cardiac catheterization. He's been given a dose of IV Lasix. He can resume oral Lasix from tomorrow. Continue to monitor.  #4 abnormal UA/possible UTI: Continue with Levaquin for now. Await urine cultures. He is afebrile and has a normal white blood cell count.  #5 possible cognitive, impairment: No focal neurological deficits appreciated tight. This may need to be worked up further by his PCP. CT head back in October did suggest lot of atrophy in his brain.  #6 history of CLL: Continue with Gleevec   DVT Prophylaxis: On Rivaroxaban Code Status: Full code Family Communication: No family is available  Disposition Plan: Observe on telemetry   Further management decisions will depend on results of further testing and patient's response to treatment.  [facility]  [facility] Pager [phone]  If 7PM-7AM, please contact night-coverage [email or web address] Password [facility]  03/03/2013, 9:49 PM

## 2013-03-03 NOTE — ED Provider Notes (Signed)
CSN: [id number]     Arrival date & time 03/03/13  1645 History   First MD Initiated Contact with Patient 03/03/13 1720     Chief Complaint  Patient presents with  . Shortness of Breath   (Consider location/radiation/quality/duration/timing/severity/associated sxs/prior Treatment) HPI Is an extremely vague historian. he complains of shortness of breath for one week with intermittent chest pain. Not made better or worse by anything. None present. Denies cough. Denies fever. No treatment prior to comin here. Recently hospitalized for same complaint. Discharged 02/22/13 with followup with Dr.Harwani and urology. Patient continues to smoke. Past Medical History  Diagnosis Date  . COPD (chronic obstructive pulmonary disease)   . Hypertension   . Pneumonia   . Systolic heart failure   . AF (atrial fibrillation) 08/21/2011  . Anxiety   . History of kidney stones   . Cancer   . Prostate cancer   . Leukemia   . Leukemia   . Chronic kidney disease    Past Surgical History  Procedure Laterality Date  . Colon surgery    . Prostate surgery  1999  . Transurethral resection of bladder tumor  08/25/2011    Procedure: TRANSURETHRAL RESECTION OF BLADDER TUMOR (TURBT);  Surgeon: Ky Barban, MD;  Location: [facility];  Service: Urology;  Laterality: N/A;  Transurethral Resection of Bladder Neck  . Cystoscopy with urethral dilatation  12/30/2011    Procedure: CYSTOSCOPY WITH URETHRAL DILATATION;  Surgeon: Ky Barban, MD;  Location: [facility];  Service: Urology;;  . [place]  12/30/2011    Procedure: CYSTOSCOPY/URETHROTOMY;  Surgeon: Ky Barban, MD;  Location: [facility];  Service: Urology;;  . Foreign body removal  12/30/2011    Procedure: FOREIGN BODY REMOVAL;  Surgeon: Ky Barban, MD;  Location: [facility];  Service: Urology;;  Transurethral Removal of 4 clips from bladder neck  . Partial colectomy  2012    [facility]  . Transurethral resection of bladder neck  07/27/2012    Procedure:  TRANSURETHRAL RESECTION OF BLADDER NECK;  Surgeon: Ky Barban, MD;  Location: [facility];  Service: Urology;;  . Cystoscopy with urethral dilatation  07/27/2012    Procedure: CYSTOSCOPY WITH URETHRAL DILATATION;  Surgeon: Ky Barban, MD;  Location: [facility];  Service: Urology;;  . Foreign body removal  07/27/2012    Procedure: FOREIGN BODY REMOVAL-CLIPS BLADDER NECK;  Surgeon: Ky Barban, MD;  Location: [facility];  Service: Urology;;  . Cataract extraction w/phaco Right 10/04/2012    Procedure: CATARACT EXTRACTION PHACO AND INTRAOCULAR LENS PLACEMENT (IOC);  Surgeon: Gemma Payor, MD;  Location: [facility];  Service: Ophthalmology;  Laterality: Right;  CDE:12.16  . Eye surgery    . Cataract extraction w/phaco Left 10/18/2012    Procedure: CATARACT EXTRACTION PHACO AND INTRAOCULAR LENS PLACEMENT (IOC);  Surgeon: Gemma Payor, MD;  Location: [facility];  Service: Ophthalmology;  Laterality: Left;  CDE:14.42   Family History  Problem Relation Age of Onset  . Heart attack Mother   . Cancer Father   . Stroke Father    History  Substance Use Topics  . Smoking status: Current Every Day Smoker -- 0.50 packs/day for 42 years    Types: Cigarettes  . Smokeless tobacco: Former Neurosurgeon  . Alcohol Use: No    Review of Systems  Unable to perform ROS: Other  Respiratory: Positive for shortness of breath.   Cardiovascular: Positive for chest pain.  Genitourinary: Positive for difficulty urinating.       Self catheterizes  for urine  ROS unobtainable as pt is extremely vague histiorian  Allergies  Review of patient's allergies indicates no known allergies.  Home Medications   Current Outpatient Rx  Name  Route  Sig  Dispense  Refill  . albuterol (PROVENTIL HFA;VENTOLIN HFA) 108 (90 BASE) MCG/ACT inhaler   Inhalation   Inhale 2 puffs into the lungs every 6 (six) hours as needed. For shortness of breath          . ALPRAZolam (XANAX) 0.5 MG tablet   Oral   Take 1 tablet (0.5 mg total) by mouth 3  (three) times daily as needed for sleep.   30 tablet   0   . atorvastatin (LIPITOR) 20 MG tablet   Oral   Take 1 tablet (20 mg total) by mouth daily at 6 PM.   30 tablet   3   . carvedilol (COREG) 12.5 MG tablet   Oral   Take 1 tablet (12.5 mg total) by mouth 2 (two) times daily with a meal.   60 tablet   3   . citalopram (CELEXA) 40 MG tablet   Oral   Take 0.5 tablets (20 mg total) by mouth daily.   30 tablet   1   . digoxin (LANOXIN) 0.25 MG tablet   Oral   Take 0.25 mg by mouth daily.         Marland Kitchen docusate sodium (COLACE) 100 MG capsule   Oral   Take 100 mg by mouth as needed. For constipation          . Fluticasone-Salmeterol (ADVAIR DISKUS) 250-50 MCG/DOSE AEPB   Inhalation   Inhale 1 puff into the lungs every 12 (twelve) hours.           . furosemide (LASIX) 40 MG tablet   Oral   Take 40 mg by mouth 2 (two) times daily.         Marland Kitchen imatinib (GLEEVEC) 100 MG tablet   Oral   Take 200 mg by mouth daily. Take with meals and large glass of water.Caution:Chemotherapy         . ipratropium-albuterol (DUONEB) 0.5-2.5 (3) MG/3ML SOLN   Nebulization   Take 3 mLs by nebulization every 4 (four) hours as needed (shortness of breath).         Marland Kitchen levofloxacin (LEVAQUIN) 500 MG tablet   Oral   Take 1 tablet (500 mg total) by mouth daily.   7 tablet   0   . lisinopril (PRINIVIL,ZESTRIL) 40 MG tablet   Oral   Take 40 mg by mouth daily.         . mometasone (ELOCON) 0.1 % cream   Topical   Apply 1 application topically daily as needed. Skin lesions          . polyethylene glycol (MIRALAX / GLYCOLAX) packet   Oral   Take 17 g by mouth daily as needed (constipation).         . potassium chloride (K-DUR,KLOR-CON) 10 MEQ tablet   Oral   Take 10 mEq by mouth daily.         . Rivaroxaban (XARELTO) 20 MG TABS tablet   Oral   Take 1 tablet (20 mg total) by mouth daily with supper.   30 tablet   3   . spironolactone (ALDACTONE) 25 MG tablet   Oral    Take 1 tablet (25 mg total) by mouth daily.   30 tablet   3   . venlafaxine (EFFEXOR) 75  MG tablet   Oral   Take 75 mg by mouth 2 (two) times daily.          BP 133/86  Pulse 106  Temp(Src) 98.2 F (36.8 C) (Oral)  Resp 29  Ht 6' (1.829 m)  Wt 200 lb (90.719 kg)  BMI 27.12 kg/m2  SpO2 95% Physical Exam  Nursing note and vitals reviewed. Constitutional: He appears well-developed and well-nourished.  HENT:  Head: Normocephalic and atraumatic.  Eyes: Conjunctivae are normal. Pupils are equal, round, and reactive to light.  Neck: Neck supple. No tracheal deviation present. No thyromegaly present.  Cardiovascular:  No murmur heard. Irregularly irregular  Pulmonary/Chest: Effort normal. No respiratory distress.  scant diffuse rhonchi  Abdominal: Soft. Bowel sounds are normal. He exhibits no distension. There is no tenderness.  Musculoskeletal: Normal range of motion. He exhibits no edema and no tenderness.  Neurological: He is alert. Coordination normal.  Skin: Skin is warm and dry. No rash noted.  Psychiatric: He has a normal mood and affect.    ED Course  Procedures (including critical care time) Labs Review Labs Reviewed  CBC WITH DIFFERENTIAL - Abnormal; Notable for the following:    Hemoglobin 12.4 (*)    All other components within normal limits  BASIC METABOLIC PANEL - Abnormal; Notable for the following:    GFR calc non Af Amer 68 (*)    GFR calc Af Amer 79 (*)    All other components within normal limits  TROPONIN I  PRO B NATRIURETIC PEPTIDE  D-DIMER, QUANTITATIVE  URINALYSIS, ROUTINE W REFLEX MICROSCOPIC   Imaging Review Dg Chest Portable 1 View  03/03/2013   CLINICAL DATA:  Chest pain and shortness of breath  EXAM: PORTABLE CHEST - 1 VIEW  COMPARISON:  02/18/2013  FINDINGS: The cardiac shadow remains mildly enlarged. No focal infiltrate or sizable effusion is noted. No acute bony abnormality is seen.  IMPRESSION: No acute abnormality noted.    Electronically Signed   By: Alcide Clever M.D.   On: 03/03/2013 17:11    EKG Interpretation   None       Date: 03/03/2013  Rate: 100  Rhythm: atrial fibrillation pvc  QRS Axis: left  Intervals: normal  ST/T Wave abnormalities: nonspecific T wave changes  Conduction Disutrbances:nonspecific intraventricular conduction delay  Narrative Interpretation:   Old EKG Reviewed: Rate has increased since 02/20/2013, PVCs now present, otherwise unchanged Results for orders placed during the hospital encounter of 03/03/13  CBC WITH DIFFERENTIAL      Result Value Range   WBC 8.1  4.0 - 10.5 K/uL   RBC 4.59  4.22 - 5.81 MIL/uL   Hemoglobin 12.4 (*) 13.0 - 17.0 g/dL   HCT 16.1  09.6 - 04.5 %   MCV 87.6  78.0 - 100.0 fL   MCH 27.0  26.0 - 34.0 pg   MCHC 30.8  30.0 - 36.0 g/dL   RDW 40.9  81.1 - 91.4 %   Platelets 289  150 - 400 K/uL   Neutrophils Relative % 61  43 - 77 %   Neutro Abs 4.9  1.7 - 7.7 K/uL   Lymphocytes Relative 27  12 - 46 %   Lymphs Abs 2.2  0.7 - 4.0 K/uL   Monocytes Relative 7  3 - 12 %   Monocytes Absolute 0.6  0.1 - 1.0 K/uL   Eosinophils Relative 4  0 - 5 %   Eosinophils Absolute 0.3  0.0 - 0.7 K/uL   Basophils Relative  1  0 - 1 %   Basophils Absolute 0.1  0.0 - 0.1 K/uL  BASIC METABOLIC PANEL      Result Value Range   Sodium 141  135 - 145 mEq/L   Potassium 3.8  3.5 - 5.1 mEq/L   Chloride 108  96 - 112 mEq/L   CO2 26  19 - 32 mEq/L   Glucose, Bld 98  70 - 99 mg/dL   BUN 14  6 - 23 mg/dL   Creatinine, Ser 9.60  0.50 - 1.35 mg/dL   Calcium 9.4  8.4 - 45.4 mg/dL   GFR calc non Af Amer 68 (*) >90 mL/min   GFR calc Af Amer 79 (*) >90 mL/min  TROPONIN I      Result Value Range   Troponin I <0.30  <0.30 ng/mL  PRO B NATRIURETIC PEPTIDE      Result Value Range   Pro B Natriuretic peptide (BNP) 3415.0 (*) 0 - 125 pg/mL  D-DIMER, QUANTITATIVE      Result Value Range   D-Dimer, Quant 0.36  0.00 - 0.48 ug/mL-FEU  DIGOXIN LEVEL      Result Value Range   Digoxin  Level <0.3 (*) 0.8 - 2.0 ng/mL   Dg Chest 2 View  02/18/2013   CLINICAL DATA:  Chest pain with worsening shortness of breath.  EXAM: CHEST  2 VIEW  COMPARISON:  01/20/2013.  FINDINGS: Cardiomegaly with pulmonary vascular congestion. Probable interstitial pulmonary edema. Mediastinal contours appear within normal limits and unchanged.  IMPRESSION: Cardiomegaly with pulmonary vascular congestion and mild interstitial pulmonary edema suggesting mild CHF.   Electronically Signed   By: Andreas Newport M.D.   On: 02/18/2013 23:48   Ct Head Wo Contrast  02/19/2013   CLINICAL DATA:  Abnormal cognition. Altered mental status. Chest pain.  EXAM: CT HEAD WITHOUT CONTRAST  TECHNIQUE: Contiguous axial images were obtained from the base of the skull through the vertex without intravenous contrast.  COMPARISON:  01/10/2013.  FINDINGS: No mass lesion, mass effect, midline shift, hydrocephalus, hemorrhage. No acute territorial cortical ischemia/infarct. Atrophy and chronic ischemic white matter disease is present.More focal chronic ischemic changes present in the left parietal lobe. Paranasal sinuses and mastoid air cells are within normal limits.  IMPRESSION: No interval change or acute intracranial abnormality. Atrophy and chronic ischemic white matter disease.   Electronically Signed   By: Andreas Newport M.D.   On: 02/19/2013 01:31   Dg Chest Portable 1 View  03/03/2013   CLINICAL DATA:  Chest pain and shortness of breath  EXAM: PORTABLE CHEST - 1 VIEW  COMPARISON:  02/18/2013  FINDINGS: The cardiac shadow remains mildly enlarged. No focal infiltrate or sizable effusion is noted. No acute bony abnormality is seen.  IMPRESSION: No acute abnormality noted.   Electronically Signed   By: Alcide Clever M.D.   On: 03/03/2013 17:11    At 7:40 PM patient states his breathing is not appreciably improved after treatment with a nebulizer treatment and Lasix 40 mg IV.  He now c/o nausea and diffuse body aches. zofran tylenol  orderedHe was catheterized for urine. Catheterization resulted in 850 mL of urine output MDM  No diagnosis found. I feel the patient is likely suffering from a combination of CHF and COPD as the cause of his dyspnea. I spoke with Dr. Barnie Del   23 hour observation Telemetry  Dx acute dyspnea   Doug Sou, MD 03/03/13 2001

## 2013-03-03 NOTE — ED Notes (Addendum)
Patient arrives via EMS from home with c/o shortness of breath that has been progressing over last several weeks. Afib on monitor. H/o COPD. Reports sharp, left sided chest pain. 95% on RA. Dyspnea at rest. Unable to speak in full sentences.

## 2013-03-03 NOTE — ED Notes (Signed)
Attempted to place an indwelling foley. Met resistance @ prostate, pt grabbed foley with his bare hands stood up and attempted to insert it himself. After much maneuvering, urine began to flow but we were unable to insert it enough for a balloon to be inflated. Allowed bladder to drain completely and cath removed

## 2013-03-04 ENCOUNTER — Encounter (HOSPITAL_COMMUNITY): Payer: Self-pay | Admitting: *Deleted

## 2013-03-04 DIAGNOSIS — I2510 Atherosclerotic heart disease of native coronary artery without angina pectoris: Secondary | ICD-10-CM

## 2013-03-04 DIAGNOSIS — I252 Old myocardial infarction: Secondary | ICD-10-CM

## 2013-03-04 DIAGNOSIS — Z79899 Other long term (current) drug therapy: Secondary | ICD-10-CM

## 2013-03-04 DIAGNOSIS — J449 Chronic obstructive pulmonary disease, unspecified: Secondary | ICD-10-CM

## 2013-03-04 DIAGNOSIS — I509 Heart failure, unspecified: Secondary | ICD-10-CM

## 2013-03-04 DIAGNOSIS — I519 Heart disease, unspecified: Secondary | ICD-10-CM

## 2013-03-04 DIAGNOSIS — Z5181 Encounter for therapeutic drug level monitoring: Secondary | ICD-10-CM

## 2013-03-04 DIAGNOSIS — I10 Essential (primary) hypertension: Secondary | ICD-10-CM

## 2013-03-04 DIAGNOSIS — C9110 Chronic lymphocytic leukemia of B-cell type not having achieved remission: Secondary | ICD-10-CM

## 2013-03-04 DIAGNOSIS — F05 Delirium due to known physiological condition: Secondary | ICD-10-CM

## 2013-03-04 LAB — COMPREHENSIVE METABOLIC PANEL
Albumin: 3.7 g/dL (ref 3.5–5.2)
BUN: 16 mg/dL (ref 6–23)
CO2: 28 mEq/L (ref 19–32)
Chloride: 102 mEq/L (ref 96–112)
Creatinine, Ser: 1.16 mg/dL (ref 0.50–1.35)
GFR calc Af Amer: 73 mL/min — ABNORMAL LOW (ref >90)
Total Bilirubin: 0.5 mg/dL (ref 0.3–1.2)
Total Protein: 7 g/dL (ref 6.0–8.3)

## 2013-03-04 LAB — CBC
HCT: 44.6 % (ref 39.0–52.0)
MCV: 88 fL (ref 78.0–100.0)
Platelets: 343 10*3/uL (ref 150–400)
RBC: 5.07 MIL/uL (ref 4.22–5.81)
WBC: 13.7 10*3/uL — ABNORMAL HIGH (ref 4.0–10.5)

## 2013-03-04 LAB — TROPONIN I: Troponin I: <0.3 ng/mL (ref <0.30)

## 2013-03-04 MED ORDER — FUROSEMIDE 40 MG PO TABS: 40.0000 mg | ORAL_TABLET | Freq: Two times a day (BID) | ORAL | Status: DC

## 2013-03-04 MED ORDER — VENLAFAXINE HCL 37.5 MG PO TABS
75.0000 mg | ORAL_TABLET | Freq: Two times a day (BID) | ORAL | Status: DC
  Administered 2013-03-04 – 2013-03-06 (×4): 75 mg via ORAL
  Filled 2013-03-04 (×4): qty 2

## 2013-03-04 MED ORDER — DIGOXIN 125 MCG PO TABS
0.2500 mg | ORAL_TABLET | Freq: Every day | ORAL | Status: DC
  Filled 2013-03-04: qty 2

## 2013-03-04 MED ORDER — DIGOXIN 0.25 MG/ML IJ SOLN
0.1250 mg | Freq: Once | INTRAMUSCULAR | Status: AC
  Administered 2013-03-04: 0.125 mg via INTRAVENOUS
  Filled 2013-03-04: qty 2

## 2013-03-04 MED ORDER — LEVALBUTEROL HCL 1.25 MG/0.5ML IN NEBU
1.2500 mg | INHALATION_SOLUTION | Freq: Four times a day (QID) | RESPIRATORY_TRACT | Status: DC
  Administered 2013-03-04 – 2013-03-06 (×6): 1.25 mg via RESPIRATORY_TRACT
  Filled 2013-03-04 (×8): qty 0.5

## 2013-03-04 MED ORDER — ALPRAZOLAM 0.5 MG PO TABS: 0.5000 mg | ORAL_TABLET | Freq: Three times a day (TID) | ORAL | Status: DC | PRN

## 2013-03-04 MED ORDER — NICOTINE 7 MG/24HR TD PT24
7.0000 mg | MEDICATED_PATCH | Freq: Every day | TRANSDERMAL | Status: DC
  Administered 2013-03-05 – 2013-03-06 (×2): 7 mg via TRANSDERMAL
  Filled 2013-03-04 (×4): qty 1

## 2013-03-04 MED ORDER — LORAZEPAM 2 MG/ML IJ SOLN
2.0000 mg | Freq: Once | INTRAMUSCULAR | Status: AC
  Administered 2013-03-04: 2 mg via INTRAVENOUS
  Filled 2013-03-04: qty 1

## 2013-03-04 MED ORDER — LORAZEPAM 2 MG/ML IJ SOLN
1.0000 mg | Freq: Once | INTRAMUSCULAR | Status: AC
  Administered 2013-03-04: 1 mg via INTRAVENOUS
  Filled 2013-03-04: qty 1

## 2013-03-04 MED ORDER — LORAZEPAM 2 MG/ML IJ SOLN
1.0000 mg | INTRAMUSCULAR | Status: DC | PRN
  Administered 2013-03-04: 2 mg via INTRAVENOUS
  Administered 2013-03-04: 1 mg via INTRAVENOUS
  Administered 2013-03-05: 2 mg via INTRAVENOUS
  Filled 2013-03-04 (×3): qty 1

## 2013-03-04 MED ORDER — DIGOXIN 0.25 MG/ML IJ SOLN
0.2500 mg | Freq: Once | INTRAMUSCULAR | Status: AC
  Administered 2013-03-04: 0.25 mg via INTRAVENOUS
  Filled 2013-03-04: qty 2

## 2013-03-04 MED ORDER — TRAMADOL HCL 50 MG PO TABS
50.0000 mg | ORAL_TABLET | Freq: Four times a day (QID) | ORAL | Status: DC | PRN
  Administered 2013-03-04 – 2013-03-06 (×4): 50 mg via ORAL
  Filled 2013-03-04 (×4): qty 1

## 2013-03-04 MED ORDER — METOPROLOL TARTRATE 1 MG/ML IV SOLN
5.0000 mg | INTRAVENOUS | Status: DC | PRN
  Administered 2013-03-05: 5 mg via INTRAVENOUS
  Filled 2013-03-04: qty 5

## 2013-03-04 NOTE — Progress Notes (Addendum)
03/04/13 1648 Late entry for 1430. Patient agitated and persistently requesting to leave.  Ambulating hallway with safety sitter this afternoon looking for MD. Pt assisted back to room.  Safety sitter at bedside. Text-paged Dr. Malachi Bonds to notify. Orders placed this afternoon to continue safety sitter, ativan PRN for anxiety. Ativan 1 mg IV for anxiety given as ordered at 1515. MD aware.  Earnstine Regal, RN

## 2013-03-04 NOTE — Progress Notes (Addendum)
[facility] PROGRESS NOTE  Mitchell [name] WUJ:[id number] DOB: [birth year] DOA: 03/03/2013 PCP: Toma Deiters, MD  Assessment/Plan  Chest pain and SOB:  Difficult historian.  Possibly improved this morning.  Catheterization two months ago for NSTEMI demonstrated nonobstructive CAD.  -  Telemetry as below -  Troponins negative  Atrial fibrillation with RVR, HR this morning trending up to the 150s.  CHADS2vasc of 2.  Stable wide complex with suggestion of atrial flutter  Digoxin level was low and may not be compliant with his medications -  Minimize medications which could exacerbate tachycardia -  Treat anxiety -  Dig load today , , , and resume oral home dose tomorrow -  Repeat ECG this afternoon while digoxin loading -  Continue xarelto -  Cardiology consult  Chronic systolic heart failure with EF 25-30% without Bi-V PM/ICD placement, CXR stable, but BNP elevated -  Appears near euvolemic -  Continue beta blocker, xarelto, ACEI, spironolactone -  Continue oral lasix  CAD, continue xarelto, BB, statin, ACEI  Chronic COPD -  Continue LABA/ICS -  D/c albuterol and start xopenex  Depression/anxiety:  Stable.    Delirium and agitation superimposed on underlying dementia -  May be having some withdrawal from effexor -  Will discontinue celexa and restart effexor -  Avoid Medications that prolong QTc -  Continue xanax prn and will increase dose  -  TSH wnl 12/2012 -  Further work up of dementia per PCP  Possible UTI  With trace LE and 21-50 WBC on UA s/p 7 days of levofloxacin  -  Yeast were present  -  Monitor for urinary retention given dysuria -  D/c levofloxacin  Nicotine abuse and dependence with withdrawal may also be contributing to agitatin -  Start nicotine patch  CLL, stable.  Continue gleevec  Diet:  Healthy haert Access:  PIV IVF:  off Proph:  xarelto  Code Status: full Family Communication: patient and his wife Disposition Plan:  pending improvement in heart rate, chest pain resolved, agitation improving, possibly home tomorrow   Consultants:  Cardiology  Procedures:  None  Antibiotics:  Levofloxacin  HPI/Subjective:  Patient states he needs to leave immediately to take his mother to [facility].  States that he feels well.  Denies chest pain, palpitations, SOB, nausea, vomiting, diarrhea.    Objective: Filed Vitals:   03/04/13 0348 03/04/13 0724 03/04/13 0923 03/04/13 0925  BP: 104/84   126/82  Pulse:   72 84  Temp: 97.5 F (36.4 C)  97.4 F (36.3 C)   TempSrc: Oral     Resp: 20  20   Height:      Weight:      SpO2: 98% 98%  95%    Intake/Output Summary (Last 24 hours) at 03/04/13 1126 Last data filed at 03/04/13 0932  Gross per 24 hour  Intake    123 ml  Output   2075 ml  Net  -1952 ml   Filed Weights   03/03/13 1652  Weight: 90.719 kg (200 lb)    Exam:   General:  CM, agitated, No acute distress  HEENT:  NCAT, MMM  Cardiovascular:  Tachycardic, IRRR, nl S1, S2, 1+ pulses, warm extremities  Respiratory:  Diminished bilateral BS, no focal rales, rhonchi, or wheeze, no increased WOB  Abdomen:   NABS, soft, NT/ND  MSK:   Normal tone and bulk, no LEE  Neuro:  Grossly intact, ambulating without difficulty.    Psych:  Confused, disoriented, mildly agitated.  Asking to leave AMA.    Data Reviewed: Basic Metabolic Panel:  Recent Labs Lab 03/03/13 1656 03/04/13 0357  NA 141 141  K 3.8 3.9  CL 108 102  CO2 26 28  GLUCOSE 98 115*  BUN 14 16  CREATININE 1.09 1.16  CALCIUM 9.4 9.2   Liver Function Tests:  Recent Labs Lab 03/04/13 0357  AST 15  ALT 13  ALKPHOS 81  BILITOT 0.5  PROT 7.0  ALBUMIN 3.7   No results found for this basename: LIPASE, AMYLASE,  in the last 168 hours No results found for this basename: AMMONIA,  in the last 168 hours CBC:  Recent Labs Lab 03/03/13 1656 03/04/13 0357  WBC 8.1 13.7*  NEUTROABS 4.9  --   HGB 12.4* 14.0  HCT 40.2 44.6   MCV 87.6 88.0  PLT 289 343   Cardiac Enzymes:  Recent Labs Lab 03/03/13 1656 03/03/13 2220 03/04/13 0356 03/04/13 0935  TROPONINI <0.30 <0.30 <0.30 <0.30   BNP (last 3 results)  Recent Labs  01/20/13 0507 02/18/13 2300 03/03/13 1656  PROBNP 915.3* 2611.0* 3415.0*   CBG: No results found for this basename: GLUCAP,  in the last 168 hours  No results found for this or any previous visit (from the past 240 hour(s)).   Studies: Dg Chest Portable 1 View  03/03/2013   CLINICAL DATA:  Chest pain and shortness of breath  EXAM: PORTABLE CHEST - 1 VIEW  COMPARISON:  02/18/2013  FINDINGS: The cardiac shadow remains mildly enlarged. No focal infiltrate or sizable effusion is noted. No acute bony abnormality is seen.  IMPRESSION: No acute abnormality noted.   Electronically Signed   By: Alcide Clever M.D.   On: 03/03/2013 17:11    Scheduled Meds: . atorvastatin  20 mg Oral q1800  . carvedilol  12.5 mg Oral BID WC  . citalopram  20 mg Oral Daily  . digoxin  0.125 mg Intravenous Once  . digoxin  0.125 mg Intravenous Once  . [START ON 03/05/2013] digoxin  0.25 mg Oral Daily  . furosemide  40 mg Oral BID  . imatinib  200 mg Oral Q breakfast  . levalbuterol  1.25 mg Nebulization Q6H  . levofloxacin  500 mg Oral Daily  . lisinopril  40 mg Oral Daily  . mometasone-formoterol  2 puff Inhalation BID  . potassium chloride  10 mEq Oral Daily  . Rivaroxaban  20 mg Oral Q supper  . sodium chloride  3 mL Intravenous Q12H  . sodium chloride  3 mL Intravenous Q12H  . spironolactone  25 mg Oral Daily   Continuous Infusions:   Principal Problem:   Acute dyspnea Active Problems:   UTI (urinary tract infection)   COPD (chronic obstructive pulmonary disease)   Chronic systolic heart failure   Chest pain   Atrial fibrillation with RVR    Time spent: 30 min    [place]  [facility] Pager [phone]. If 7PM-7AM, please contact night-coverage at [email or web address], password  [facility] 03/04/2013, 11:26 AM  LOS: 1 day

## 2013-03-04 NOTE — Progress Notes (Signed)
Utilization review completed.  

## 2013-03-04 NOTE — Progress Notes (Signed)
03/04/13 1358 Patient stated "I need to be catheterized". Pt states self-catheterizes at home "two times per week". Wife sometimes assists with self-catheterization per night shift RN report. Notified Dr. Malachi Bonds, order placed for in and out catheter PRN for urinary retention. Patient refused nursing staff assist with catheterization, stated "i can do it myself". Pt self-catheterized with nursing supervision. 150 ml yellow urine returned. Pt tolerated well, no complaints. Notified Dr. Malachi Bonds. Earnstine Regal, RN

## 2013-03-04 NOTE — Progress Notes (Signed)
Patient's HR is trending down with digoxin loading.  Will likely be ready for discharge tomorrow after loading complete.  Not stable for discharge today.

## 2013-03-04 NOTE — Consult Note (Signed)
CARDIOLOGY CONSULT NOTE   Patient ID: Mitchell [name] MRN: [id number] DOB/AGE: 67-Jun-1947 67 y.o.  Admit Date: 03/03/2013 Referring Physician: PTH Primary Physician: Toma Deiters, MD Consulting Cardiologist: Prentice Docker Primary Cardiologist: Sharyn Lull MD Reason for Consultation: Afib with RVR  Clinical Summary Mitchell [name] is a 67 y.o.male with dementia admitted after complaints of pain in penis, and left sided chest pain. He has a history of CAD,(most recent cath in Sept of 2014, atrial fibrillation, COPD, hypertension, and CHF. He was recently discharged from hospital with chest pain and UTI secondary to bladder neck obstruction, hx of CLL and tobacco abuse. On discharge he was placed on Xarelto.    On arrival to ER he was found to be in afib with elevated HR. 106-114. Troponin was found to be negative X 4. Mildly elevated Pro-BNP at 3,415. We are asked to see patient for atrial fibrillation management. He denies bleeding or palpitations. He was placed on diltiazem but was taken off of this by PTH in the setting of severe systolic dysfunction.    No Known Allergies  Medications Scheduled Medications: . atorvastatin  20 mg Oral q1800  . carvedilol  12.5 mg Oral BID WC  . digoxin  0.125 mg Intravenous Once  . digoxin  0.125 mg Intravenous Once  . [START ON 03/05/2013] digoxin  0.25 mg Oral Daily  . [START ON 03/05/2013] furosemide  40 mg Oral BID  . imatinib  200 mg Oral Q breakfast  . levalbuterol  1.25 mg Nebulization Q6H  . lisinopril  40 mg Oral Daily  . mometasone-formoterol  2 puff Inhalation BID  . nicotine  7 mg Transdermal Daily  . potassium chloride  10 mEq Oral Daily  . rivaroxaban  20 mg Oral Q supper  . sodium chloride  3 mL Intravenous Q12H  . sodium chloride  3 mL Intravenous Q12H  . spironolactone  25 mg Oral Daily  . venlafaxine  75 mg Oral BID WC       PRN Medications:  sodium chloride, acetaminophen, acetaminophen, ALPRAZolam, metoprolol,  morphine injection, ondansetron (ZOFRAN) IV, ondansetron, polyethylene glycol, sodium chloride, traMADol   Past Medical History  Diagnosis Date  . COPD (chronic obstructive pulmonary disease)   . Hypertension   . Pneumonia   . Systolic heart failure   . AF (atrial fibrillation) 08/21/2011  . Anxiety   . History of kidney stones   . Cancer   . Prostate cancer   . Leukemia   . Leukemia   . Chronic kidney disease     Past Surgical History  Procedure Laterality Date  . Colon surgery    . Prostate surgery  1999  . Transurethral resection of bladder tumor  08/25/2011    Procedure: TRANSURETHRAL RESECTION OF BLADDER TUMOR (TURBT);  Surgeon: Ky Barban, MD;  Location: [facility];  Service: Urology;  Laterality: N/A;  Transurethral Resection of Bladder Neck  . Cystoscopy with urethral dilatation  12/30/2011    Procedure: CYSTOSCOPY WITH URETHRAL DILATATION;  Surgeon: Ky Barban, MD;  Location: [facility];  Service: Urology;;  . [place]  12/30/2011    Procedure: CYSTOSCOPY/URETHROTOMY;  Surgeon: Ky Barban, MD;  Location: [facility];  Service: Urology;;  . Foreign body removal  12/30/2011    Procedure: FOREIGN BODY REMOVAL;  Surgeon: Ky Barban, MD;  Location: [facility];  Service: Urology;;  Transurethral Removal of 4 clips from bladder neck  . Partial colectomy  2012    [facility]  . Transurethral  resection of bladder neck  07/27/2012    Procedure: TRANSURETHRAL RESECTION OF BLADDER NECK;  Surgeon: Ky Barban, MD;  Location: [facility];  Service: Urology;;  . Cystoscopy with urethral dilatation  07/27/2012    Procedure: CYSTOSCOPY WITH URETHRAL DILATATION;  Surgeon: Ky Barban, MD;  Location: [facility];  Service: Urology;;  . Foreign body removal  07/27/2012    Procedure: FOREIGN BODY REMOVAL-CLIPS BLADDER NECK;  Surgeon: Ky Barban, MD;  Location: [facility];  Service: Urology;;  . Cataract extraction w/phaco Right 10/04/2012    Procedure: CATARACT EXTRACTION PHACO AND  INTRAOCULAR LENS PLACEMENT (IOC);  Surgeon: Gemma Payor, MD;  Location: [facility];  Service: Ophthalmology;  Laterality: Right;  CDE:12.16  . Eye surgery    . Cataract extraction w/phaco Left 10/18/2012    Procedure: CATARACT EXTRACTION PHACO AND INTRAOCULAR LENS PLACEMENT (IOC);  Surgeon: Gemma Payor, MD;  Location: [facility];  Service: Ophthalmology;  Laterality: Left;  CDE:14.42    Family History  Problem Relation Age of Onset  . Heart attack Mother   . Cancer Father   . Stroke Father     Social History Mitchell [name] reports that he has been smoking Cigarettes.  He has a 21 pack-year smoking history. He has quit using smokeless tobacco. Mitchell [name] reports that he does not drink alcohol.  Review of Systems Otherwise reviewed and negative except as outlined.  Physical Examination Blood pressure 126/82, pulse 84, temperature 97.4 F (36.3 C), temperature source Oral, resp. rate 20, height 6' (1.829 m), weight 200 lb (90.719 kg), SpO2 95.00%.  Intake/Output Summary (Last 24 hours) at 03/04/13 1310 Last data filed at 03/04/13 1209  Gross per 24 hour  Intake    243 ml  Output   2075 ml  Net  -1832 ml    Telemetry:Atrial fib rates in the 80's  HEENT: Conjunctiva and lids normal, oropharynx clear with moist mucosa. Neck: Supple, no elevated JVP or carotid bruits, no thyromegaly. Lungs: Clear to auscultation, nonlabored breathing at rest. Cardiac: Regular rate and rhythm, no S3 or significant systolic murmur, no pericardial rub. Abdomen: Soft, nontender, no hepatomegaly, bowel sounds present, no guarding or rebound. Extremities: No pitting edema, distal pulses 2+. Skin: Warm and dry. Musculoskeletal: No kyphosis. Neuropsychiatric: Alert and oriented x3, affect grossly appropriate.  Prior Cardiac Testing/Procedures  1. Cardiac Cath 12/2012 FINDINGS: LV showed LV was moderately enlarged. Global hypokinesia.  EF of 30-35%. Left main was large which has 15-20% distal stenosis.  LAD has  15-20% proximal and mid stenosis. Diagonal 1 was moderate size,  which has mild disease. Left circumflex is patent. OM 1 is large,  which has 15-20% ostial and proximal stenosis  2. Echo 08/21/2012 Left ventricle: The cavity size was moderately dilated. Wall thickness was increased increased in a pattern of mild to moderate LVH. Systolic function was severely reduced. The estimated ejection fraction was in the range of 25% to 30% in the setting of rapid atrial fibrillation. Diffuse hypokinesis, more prominent in the inferoposterior walls. The study is not technically sufficient to allow evaluation of LV diastolic function - atrial fibrillation present. - Aortic valve: Not well visualized. Probably trileaflet. Trivial regurgitation. - Aorta: Aortic root dimension: 46mm (ED). - Aortic root: The aortic root was moderately dilated. - Mitral valve: Trivial regurgitation. - Left atrium: The atrium was moderately dilated. - Right ventricle: Systolic function was mildly reduced. - Tricuspid valve: Mild regurgitation. - Inferior vena cava: The vessel was dilated. - Pericardium, extracardiac: A small pericardial  effusion was identified posterior to the heart.   Lab Results  Basic Metabolic Panel:  Recent Labs Lab 03/03/13 1656 03/04/13 0357  NA 141 141  K 3.8 3.9  CL 108 102  CO2 26 28  GLUCOSE 98 115*  BUN 14 16  CREATININE 1.09 1.16  CALCIUM 9.4 9.2    Liver Function Tests:  Recent Labs Lab 03/04/13 0357  AST 15  ALT 13  ALKPHOS 81  BILITOT 0.5  PROT 7.0  ALBUMIN 3.7    CBC:  Recent Labs Lab 03/03/13 1656 03/04/13 0357  WBC 8.1 13.7*  NEUTROABS 4.9  --   HGB 12.4* 14.0  HCT 40.2 44.6  MCV 87.6 88.0  PLT 289 343    Cardiac Enzymes:  Recent Labs Lab 03/03/13 1656 03/03/13 2220 03/04/13 0356 03/04/13 0935  TROPONINI <0.30 <0.30 <0.30 <0.30     Radiology: Dg Chest Portable 1 View  03/03/2013   CLINICAL DATA:  Chest pain and shortness of breath   EXAM: PORTABLE CHEST - 1 VIEW  COMPARISON:  02/18/2013  FINDINGS: The cardiac shadow remains mildly enlarged. No focal infiltrate or sizable effusion is noted. No acute bony abnormality is seen.  IMPRESSION: No acute abnormality noted.   Electronically Signed   By: Alcide Clever M.D.   On: 03/03/2013 17:11     WUJ:WJXBJY fib with LVH. Rate of 93 bpm.   Impression and Recommendations:  1. Atrial fibrillation: He was taken off of diltiazem and restarted on digoxin after IV reloading, carvedilol, and continued on rivaroxaban. Heart rate is well controlled currently. No further recommendations at this time. Continue current management.  2. CAD: Recent cardiac cath in Sept of this year with minimal non-obstructive disease. He no longer complaints of chest pain. EF 35% per cath. He states he is medically compliant, he wife helps him with his medications. No mention of ICD per Dr. Greig Right notes with systolic dysfunction. No evidence of CHF on CXR or clinical exam. Continue lasix and spironolactone. Creatinine 1.16. We defer to primary cardiologist for discussion and management for placement.  3. Bladder neck obstruction: Has complaints of ongoing dysuria and pain in his penis. Urology consultation should be obtained at discretion of PTH.     Signed: Bettey Mare. Lyman Bishop NP [facility] 03/04/2013, 1:10 PM Co-Sign MD

## 2013-03-04 NOTE — Progress Notes (Signed)
Patient complains of severe pain with foley.  Explained to patient that foley was to help with diuresis, especially since he has difficulty with urination.  Patient still complained of pain and wanted foley out.  Discontinued foley.

## 2013-03-04 NOTE — Progress Notes (Signed)
03/04/13 1019 Patient agitated this morning, stated "i need to see that doctor, I'm going home". Encouraged patient to not get up on his own for safety. Pt ambulated in hallway stated "I'm looking for that doctor". Assisted patient back to room, chair alarm on for safety. Notified Dr. Kerry Hough of patient agitation and wanting to be discharged. Dr. Malachi Bonds to see patient today. Notified patient MD would be rounding, pt requested nursing staff call his wife. Spoke with his wife who stated she would like update from the doctor once they rounded before coming to see patient. Notified Dr. Malachi Bonds of patient agitation and HR tachy in 140s-150s. Orders placed per MD. Stated she had seen patient and spoken with wife. Digoxin and ativan given as ordered, see MAR.  Provided emotional support for patient. Up in chair at this time with chair alarm on for safety. Earnstine Regal, RN

## 2013-03-04 NOTE — Progress Notes (Signed)
03/04/13 1934 Late entry for 1745.  Patient BP 98/63 when rechecked per nurse tech this evening. Earnstine Regal, RN

## 2013-03-04 NOTE — Consult Note (Signed)
The patient was seen and examined, and I agree with the assessment and plan as documented above, with modifications as noted below. Mitchell [name] has atrial fibrillation with RVR and appears to be asymptomatic at present, albeit heart rate is under better control now after digoxin loading. He has chronic systolic heart failure and appears to be compensated at present, and has put out 1.8 liters. He has a wide QRS and an EF of 25-30% (echo from 2013).  RECS: Would continue Coreg and digoxin loading for rate control, as dig level was noted to be low and there may be an issue of medication noncompliance (albeit patient is demented and a poor historian). If heart rate control remains difficult over the weekend, one could consider IV amiodarone loading. He remains anticoagulated with Xarelto. From a heart failure standpoint, he appears compensated and would thus continue beta blockers, digoxin, Lasix, lisinopril, and spironolactone. He does have a severely depressed EF (at least by echo report from 2013) with a wide QRS, and thus consideration could be given for Bi-V/ICD, but I will defer to the patient's primary cardiologist. He has nonobstructive CAD by most recent cath, and would continue low-dose Lipitor.

## 2013-03-04 NOTE — Progress Notes (Signed)
Patient has a Recruitment consultant at bedside.  Patient refusing to stay in his room, trying to go into others patients rooms stating "I am going home!".  Patient is confused and being loud with staff.  MD called and orders given.

## 2013-03-04 NOTE — Plan of Care (Signed)
Problem: Phase I Progression Outcomes Goal: Progress activity as tolerated unless otherwise ordered Outcome: Completed/Met Date Met:  03/04/13 03/04/13 1628 Patient ambulates in room and hallway frequently with nursing staff assist. Safety sitter at bedside and walks with patient. MD aware. Earnstine Regal, RN

## 2013-03-04 NOTE — Evaluation (Addendum)
Physical Therapy Evaluation Patient Details Name: Mitchell [name] MRN: [id number] DOB: 04/25/1946 Today's Date: 03/04/2013 Time: 0272-5366 PT Time Calculation (min): 20 min  PT Assessment / Plan / Recommendation History of Present Illness  Pt is admitted with acute dyspnea.  He is O2 dependent at night on 2.5 L/min and lives with his wife.  He is normally independent with all ADLs.  He has a hx of Afib and CHF.  There is some question of dementia.  Clinical Impression   Pt was seen for evaluation.  Today he is pleasant and cooperative, anxious to get home.  He did display mild confusion stating that he didn't want his wife and mother to have to wait around in the hospital while awaiting his d/c.Marland Kitchenno family was actually here.  His strength and balance are WNL and he was able to ambulate with no assistive device on RA for at least 300' with no difficulty.  O2 sat on RA was 96%.   03-05-13  Pt was observed ambulating in the hall with CNA.  He was noted to have some decreased balance manifested by slight ataxia.  From previous PT notes at last admission, pt was not ammenable to using a walker.  Therefore, I would recommend HHPT at d/c.  PT Assessment      Follow Up Recommendations  HHPT  Does the patient have the potential to tolerate intense rehabilitation      Barriers to Discharge        Equipment Recommendations  None recommended by PT    Recommendations for Other Services     Frequency      Precautions / Restrictions Precautions Precautions: None Restrictions Weight Bearing Restrictions: No   Pertinent Vitals/Pain       Mobility  Bed Mobility Supine to Sit: Not tested (comment) Sit to Sidelying Left: Not Tested (comment) Transfers Sit to Stand: 7: Independent;From chair/3-in-1;With upper extremity assist Stand to Sit: 7: Independent;With upper extremity assist;To chair/3-in-1 Ambulation/Gait Ambulation/Gait Assistance: 7: Independent Ambulation Distance (Feet): 300  Feet Assistive device: None Gait Pattern: Within Functional Limits Gait velocity: WNL Stairs: No Wheelchair Mobility Wheelchair Mobility: No    Exercises     PT Diagnosis:    PT Problem List:   PT Treatment Interventions:       PT Goals(Current goals can be found in the care plan section) Acute Rehab PT Goals PT Goal Formulation: No goals set, d/c therapy  Visit Information  Last PT Received On: 03/04/13 History of Present Illness: Pt is admitted with acute dyspnea.  He is O2 dependent at night on 2.5 L/min and lives with his wife.  He is normally independent with all ADLs.  He has a hx of Afib and CHF.  There is some question of dementia.       Prior Functioning  Home Living Family/patient expects to be discharged to:: Private residence Living Arrangements: Spouse/significant other Available Help at Discharge: Family;Available 24 hours/day Type of Home: House Home Access: Level entry Home Layout: One level Home Equipment: None Prior Function Level of Independence: Independent Communication Communication: No difficulties    Cognition  Cognition Arousal/Alertness: Awake/alert Behavior During Therapy: WFL for tasks assessed/performed Overall Cognitive Status: Within Functional Limits for tasks assessed    Extremity/Trunk Assessment Lower Extremity Assessment Lower Extremity Assessment: Overall WFL for tasks assessed   Balance Balance Balance Assessed: No (WNL by functional observation)  End of Session PT - End of Session Equipment Utilized During Treatment: Gait belt Activity Tolerance: Patient tolerated treatment well  Patient left: in chair;with call bell/phone within reach;with chair alarm set  GP Functional Assessment Tool Used: clinical judgement Functional Limitation: Mobility: Walking and moving around Mobility: Walking and Moving Around Current Status [phone]): 0 percent impaired, limited or restricted Mobility: Walking and Moving Around Goal Status  (U0454): 0 percent impaired, limited or restricted Mobility: Walking and Moving Around Discharge Status [phone]): 0 percent impaired, limited or restricted   Myrlene Broker L 03/04/2013, 9:02 AM

## 2013-03-05 DIAGNOSIS — F411 Generalized anxiety disorder: Secondary | ICD-10-CM

## 2013-03-05 DIAGNOSIS — N179 Acute kidney failure, unspecified: Secondary | ICD-10-CM

## 2013-03-05 DIAGNOSIS — Z7901 Long term (current) use of anticoagulants: Secondary | ICD-10-CM

## 2013-03-05 LAB — BASIC METABOLIC PANEL
CO2: 27 mEq/L (ref 19–32)
Calcium: 9.4 mg/dL (ref 8.4–10.5)
Creatinine, Ser: 1.74 mg/dL — ABNORMAL HIGH (ref 0.50–1.35)
GFR calc Af Amer: 45 mL/min — ABNORMAL LOW (ref >90)
GFR calc non Af Amer: 39 mL/min — ABNORMAL LOW (ref >90)
Potassium: 4.2 mEq/L (ref 3.5–5.1)
Sodium: 135 mEq/L (ref 135–145)

## 2013-03-05 LAB — URINE CULTURE

## 2013-03-05 LAB — DIGOXIN LEVEL: Digoxin Level: 1.2 ng/mL (ref 0.8–2.0)

## 2013-03-05 LAB — CBC
Platelets: 355 10*3/uL (ref 150–400)
RBC: 4.82 MIL/uL (ref 4.22–5.81)
RDW: 15.1 % (ref 11.5–15.5)
WBC: 14.2 10*3/uL — ABNORMAL HIGH (ref 4.0–10.5)

## 2013-03-05 MED ORDER — CLONAZEPAM 0.5 MG PO TABS
0.2500 mg | ORAL_TABLET | Freq: Two times a day (BID) | ORAL | Status: DC
  Administered 2013-03-05 – 2013-03-06 (×3): 0.25 mg via ORAL
  Filled 2013-03-05 (×3): qty 1

## 2013-03-05 MED ORDER — RIVAROXABAN 15 MG PO TABS
15.0000 mg | ORAL_TABLET | Freq: Every day | ORAL | Status: DC
  Administered 2013-03-05: 15 mg via ORAL
  Filled 2013-03-05: qty 1

## 2013-03-05 MED ORDER — SODIUM CHLORIDE 0.9 % IV BOLUS (SEPSIS)
250.0000 mL | Freq: Once | INTRAVENOUS | Status: AC
  Administered 2013-03-05: 250 mL via INTRAVENOUS

## 2013-03-05 NOTE — Progress Notes (Addendum)
[facility] PROGRESS NOTE  Mitchell [name]:[id number] DOB: [birth year] DOA: 03/03/2013 PCP: Toma Deiters, MD  Assessment/Plan  Chest pain and SOB, Catheterization two months ago for NSTEMI demonstrated nonobstructive CAD.  -  HR trending down and denying chest pain -  Troponins negative  Atrial fibrillation with RVR, HR down to 100s to 110s  After dig load on 11/7.  CHADS2vasc of 2.  AKI today, however.  Worried about possibility of dig toxicity -  Hold oral dig this AM -  Check dig level -- if low or low normal, will continue oral dose this AM -  Continue xarelto -  Appreciate Cardiology recommendations  AKI, ddx includes obstruction (intermittently caths at home and may not be catheterizing enough in hospital) vs. Dehydration -  Hold ACEI, potassium, lasix -  Check PVR -  If > retained urine, will continue I/O caths q6h  -  Repeat BMP in AM -  Minimize nephrotoxins -  Renally dose medications (appreciate pharmacy help with xarelto) -  NS bolus   Chronic systolic heart failure with EF 25-30% without Bi-V PM/ICD placement, CXR stable, but BNP elevated -  Continue beta blocker, xarelto, spironolactone -  Holding lasix, ACEI due to AKI  CAD, continue xarelto, BB, statin  Chronic COPD, stable. -  Continue LABA/ICS -  Continue xopenex  Depression/anxiety:  Stable.    Delirium and agitation superimposed on underlying dementia -  May be having some withdrawal from effexor -  Continue effexor and hold celexa due to drug drug interaction plus prolonged QTc -  Start clonazepam low dose  -  TSH wnl 12/2012 -  Continue sitter -  Further work up of dementia per PCP  Possible UTI  With trace LE and 21-50 WBC on UA s/p 7 days of levofloxacin  -  Yeast were present  -  F/u urine culture  Nicotine abuse and dependence with withdrawal may also be contributing to agitatin -  Continue nicotine patch  CLL, stable.  Continue gleevec  Diet:  Healthy  haert Access:  PIV IVF:  off Proph:  xarelto  Code Status: full Family Communication: patient and his wife Disposition Plan:  Pending improvement in AKI   Consultants:  Cardiology  Procedures:  None  Antibiotics:  Levofloxacin last dose on 11/7  HPI/Subjective:  Patient denies CP but still has some SOB, cough.  Denies nausea, vomiting, diarrhea.  Objective: Filed Vitals:   03/05/13 0027 03/05/13 0537 03/05/13 0750 03/05/13 0835  BP: 117/73 113/67    Pulse:  105  93  Temp:  98.1 F (36.7 C)    TempSrc:      Resp:  20    Height:      Weight:      SpO2:  95% 94%     Intake/Output Summary (Last 24 hours) at 03/05/13 0842 Last data filed at 03/05/13 0818  Gross per 24 hour  Intake    583 ml  Output   1400 ml  Net   -817 ml   Filed Weights   03/03/13 1652  Weight: 90.719 kg (200 lb)    Exam:   General:  CM, No acute distress  HEENT:  NCAT, MMM  Cardiovascular:  Tachycardic, IRRR, nl S1, S2, 1+ pulses, warm extremities  Respiratory:  Diminished bilateral BS, no focal rales, rhonchi, or wheeze, no increased WOB  Abdomen:   NABS, soft, NT/ND  MSK:   Normal tone and bulk, no LEE  Neuro:  Grossly intact, ambulating without  difficulty.    Psych:  Confused, disoriented, mildly agitated.  Knows it is November, but unclear about date or day of week.  Unsure about where he is.    Data Reviewed: Basic Metabolic Panel:  Recent Labs Lab 03/03/13 1656 03/04/13 0357 03/05/13 0600  NA 141 141 135  K 3.8 3.9 4.2  CL 108 102 98  CO2 26 28 27   GLUCOSE 98 115* 116*  BUN 14 16 26*  CREATININE 1.09 1.16 1.74*  CALCIUM 9.4 9.2 9.4   Liver Function Tests:  Recent Labs Lab 03/04/13 0357  AST 15  ALT 13  ALKPHOS 81  BILITOT 0.5  PROT 7.0  ALBUMIN 3.7   No results found for this basename: LIPASE, AMYLASE,  in the last 168 hours No results found for this basename: AMMONIA,  in the last 168 hours CBC:  Recent Labs Lab 03/03/13 1656 03/04/13 0357  03/05/13 0600  WBC 8.1 13.7* 14.2*  NEUTROABS 4.9  --   --   HGB 12.4* 14.0 13.1  HCT 40.2 44.6 41.9  MCV 87.6 88.0 86.9  PLT 289 343 355   Cardiac Enzymes:  Recent Labs Lab 03/03/13 1656 03/03/13 2220 03/04/13 0356 03/04/13 0935  TROPONINI <0.30 <0.30 <0.30 <0.30   BNP (last 3 results)  Recent Labs  01/20/13 0507 02/18/13 2300 03/03/13 1656  PROBNP 915.3* 2611.0* 3415.0*   CBG: No results found for this basename: GLUCAP,  in the last 168 hours  No results found for this or any previous visit (from the past 240 hour(s)).   Studies: Dg Chest Portable 1 View  03/03/2013   CLINICAL DATA:  Chest pain and shortness of breath  EXAM: PORTABLE CHEST - 1 VIEW  COMPARISON:  02/18/2013  FINDINGS: The cardiac shadow remains mildly enlarged. No focal infiltrate or sizable effusion is noted. No acute bony abnormality is seen.  IMPRESSION: No acute abnormality noted.   Electronically Signed   By: Alcide Clever M.D.   On: 03/03/2013 17:11    Scheduled Meds: . atorvastatin  20 mg Oral q1800  . carvedilol  12.5 mg Oral BID WC  . digoxin  0.25 mg Oral Daily  . imatinib  200 mg Oral Q breakfast  . levalbuterol  1.25 mg Nebulization Q6H  . lisinopril  40 mg Oral Daily  . mometasone-formoterol  2 puff Inhalation BID  . nicotine  7 mg Transdermal Daily  . potassium chloride  10 mEq Oral Daily  . rivaroxaban  15 mg Oral Q supper  . sodium chloride  3 mL Intravenous Q12H  . sodium chloride  3 mL Intravenous Q12H  . spironolactone  25 mg Oral Daily  . venlafaxine  75 mg Oral BID WC   Continuous Infusions:   Principal Problem:   Acute dyspnea Active Problems:   UTI (urinary tract infection)   COPD (chronic obstructive pulmonary disease)   Chronic systolic heart failure   Chest pain   Atrial fibrillation with RVR    Time spent: 30 min    Derk Doubek  [facility] Pager [phone]. If 7PM-7AM, please contact night-coverage at [email or web address], password  [facility] 03/05/2013, 8:42 AM  LOS: 2 days

## 2013-03-05 NOTE — Progress Notes (Signed)
ANTICOAGULATION CONSULT NOTE - Follow Up Consult  Pharmacy Consult for Xarelto (home medication) Indication: atrial fibrillation  No Known Allergies  Patient Measurements: Height: 6' (182.9 cm) Weight: 200 lb (90.719 kg) IBW/kg (Calculated) : 77.6  Vital Signs: Temp: 98.1 F (36.7 C) (11/08 0537) Temp src: Oral (11/07 2204) BP: 113/67 mmHg (11/08 0537) Pulse Rate: 105 (11/08 0537)  Labs:  Recent Labs  03/03/13 1656 03/03/13 2220 03/04/13 0356 03/04/13 0357 03/04/13 0935 03/05/13 0600  HGB 12.4*  --   --  14.0  --  13.1  HCT 40.2  --   --  44.6  --  41.9  PLT 289  --   --  343  --  355  CREATININE 1.09  --   --  1.16  --  1.74*  TROPONINI <0.30 <0.30 <0.30  --  <0.30  --     Estimated Creatinine Clearance: 45.2 ml/min (by C-G formula based on Cr of 1.74).   Medications:  Prescriptions prior to admission  Medication Sig Dispense Refill  . albuterol (PROVENTIL HFA;VENTOLIN HFA) 108 (90 BASE) MCG/ACT inhaler Inhale 2 puffs into the lungs every 6 (six) hours as needed. For shortness of breath       . albuterol (PROVENTIL) (2.5 MG/3ML) 0.083% nebulizer solution Take 2.5 mg by nebulization every 6 (six) hours as needed for wheezing or shortness of breath.      . ALPRAZolam (XANAX) 1 MG tablet Take 1 mg by mouth 3 (three) times daily as needed for anxiety.      Marland Kitchen atorvastatin (LIPITOR) 20 MG tablet Take 1 tablet (20 mg total) by mouth daily at 6 PM.  30 tablet  3  . carvedilol (COREG) 12.5 MG tablet Take 1 tablet (12.5 mg total) by mouth 2 (two) times daily with a meal.  60 tablet  3  . citalopram (CELEXA) 40 MG tablet Take 0.5 tablets (20 mg total) by mouth daily.  30 tablet  1  . docusate sodium (COLACE) 100 MG capsule Take 100 mg by mouth as needed. For constipation       . Fluticasone-Salmeterol (ADVAIR DISKUS) 250-50 MCG/DOSE AEPB Inhale 1 puff into the lungs every 12 (twelve) hours.        . furosemide (LASIX) 40 MG tablet Take 40 mg by mouth 2 (two) times daily.       Marland Kitchen imatinib (GLEEVEC) 100 MG tablet Take 200 mg by mouth daily. Take with meals and large glass of water.Caution:Chemotherapy      . lisinopril (PRINIVIL,ZESTRIL) 40 MG tablet Take 40 mg by mouth daily.      . metoprolol succinate (TOPROL-XL) 50 MG 24 hr tablet Take 50 mg by mouth daily.      . mometasone (ELOCON) 0.1 % cream Apply 1 application topically daily as needed. Skin lesions       . polyethylene glycol (MIRALAX / GLYCOLAX) packet Take 17 g by mouth daily as needed (constipation).      . potassium chloride (K-DUR,KLOR-CON) 10 MEQ tablet Take 10 mEq by mouth daily.      . Rivaroxaban (XARELTO) 20 MG TABS tablet Take 1 tablet (20 mg total) by mouth daily with supper.  30 tablet  3  . spironolactone (ALDACTONE) 25 MG tablet Take 1 tablet (25 mg total) by mouth daily.  30 tablet  3  . traMADol (ULTRAM) 50 MG tablet Take 50 mg by mouth 3 (three) times daily as needed.      . venlafaxine (EFFEXOR) 75 MG tablet Take 75  mg by mouth 2 (two) times daily.      Marland Kitchen levofloxacin (LEVAQUIN) 500 MG tablet Take 1 tablet (500 mg total) by mouth daily.  7 tablet  0    Assessment: 67yo male on Xarelto 20mg  po daily at home.  Since admission pt's SCr has risen (1.16 >> 1.74).  Estimated Creatinine Clearance: 45.2 ml/min (by C-G formula based on Cr of 1.74).  Goal of Therapy:  Monitor platelets by anticoagulation protocol: Yes   Plan:  Reduce Xarelto to 15mg  po daily for ClCr < 50 ml/min Monitor CBC and renal fxn.  Margo Aye, Marshia Tropea A 03/05/2013,8:13 AM

## 2013-03-05 NOTE — Progress Notes (Signed)
03/05/13 0853 Patient had in-and -out cath during night with 350 ml output per night shift RN. Pt voided independently x 3 this morning per nurse tech sitter report, Dr. Malachi Bonds aware. Requested bladder scan to check post-void residual. Bladder scan amount 20 ml with multiple scans. Notified Dr. Malachi Bonds. Nuns cap and urinal in room for accurate I&O. Earnstine Regal, RN

## 2013-03-06 DIAGNOSIS — R338 Other retention of urine: Secondary | ICD-10-CM

## 2013-03-06 DIAGNOSIS — N179 Acute kidney failure, unspecified: Secondary | ICD-10-CM

## 2013-03-06 LAB — BASIC METABOLIC PANEL
Calcium: 9.7 mg/dL (ref 8.4–10.5)
Chloride: 100 mEq/L (ref 96–112)
GFR calc Af Amer: 59 mL/min — ABNORMAL LOW (ref >90)
GFR calc non Af Amer: 51 mL/min — ABNORMAL LOW (ref >90)
Glucose, Bld: 106 mg/dL — ABNORMAL HIGH (ref 70–99)
Potassium: 4.4 mEq/L (ref 3.5–5.1)
Sodium: 136 mEq/L (ref 135–145)

## 2013-03-06 LAB — CBC
HCT: 42.4 % (ref 39.0–52.0)
Hemoglobin: 13.3 g/dL (ref 13.0–17.0)
MCHC: 31.4 g/dL (ref 30.0–36.0)
MCV: 88 fL (ref 78.0–100.0)
WBC: 9.9 10*3/uL (ref 4.0–10.5)

## 2013-03-06 MED ORDER — RIVAROXABAN 10 MG PO TABS
20.0000 mg | ORAL_TABLET | Freq: Every day | ORAL | Status: DC
  Filled 2013-03-06: qty 1
  Filled 2013-03-06: qty 2

## 2013-03-06 NOTE — Discharge Summary (Signed)
Physician Discharge Summary  Mitchell [name] ION:[id number] DOB: Nov 22, 1945 DOA: 03/03/2013  PCP: Mitchell Deiters, MD  Admit date: 03/03/2013 Discharge date: 03/06/2013  Recommendations for Outpatient Follow-up:  1. Follow up with cardiology within one to two weeks for blood pressure check, heart rate check.   2. Will arrange for home health RN to assess house for safety and to assist with escalation to memory unit if necessary.   3. Follow up with PCP for repeat BMP in 1 week to check creatinine post AKI and further evaluation for dementia if not already complete.  Please restart ACEI at follow up appointement if kidney function allows.   4. Daily weights  Discharge Diagnoses:  Principal Problem:   Acute dyspnea Active Problems:   UTI (urinary tract infection)   COPD (chronic obstructive pulmonary disease)   Chronic systolic heart failure   Chest pain   Atrial fibrillation with RVR   AKI (acute kidney injury)   Discharge Condition: stable, improved  Diet recommendation: healthy heart  Wt Readings from Last 3 Encounters:  03/06/13 89.132 kg (196 lb 8 oz)  02/19/13 91 kg (200 lb 9.9 oz)  01/20/13 90.719 kg (200 lb)    History of present illness:  Mitchell [name] is a 67 y.o. male with a past medical history of atrial fibrillation on anticoagulation, systolic CHF, with a known EF of about 25-30%, who is a very poor historian. Patient appears to have some degree of cognitive, impairment and could have dementia. He had forgotten why he came in to the hospital. He was complaining of pain in his penis area. He, apparently, self catheterizes, and they had some difficulty placing his Foley in the emergency department. Apparently, he came in because he had been Mitchell [name] of breath. This has been present for the last few weeks. He also is complaining of left-sided chest pain. And, hence decided to come in. He was recently hospitalized, in October for CHF exacerbation. He was also hospitalized in  September for a non-ST elevation MI. Cardiac catheterization did not show any critical lesions. Currently, patient denies any nausea, vomiting. He still having a little bit of chest pain, but unable to quantify, unspecified. Denies any leg swelling. Denies any wheezing. No cough. Basically, very poor historian. He was unable to answer most of my questions.   Hospital Course:   Chest pain:  Mitchell [name] was admitted with chest pain and shortness of breath and was found to be in a-fib with RVR.  He was a poor historian and repeatedly could not remember why he came to the hospital in the first place.  He underwent cardiac catheterization two months ago for NSTEMI which demonstrated nonobstructive CAD.  His ECG did not demonstrate signs of acute ischemia and his troponins were negative.  He was seen by cardiology who assisted with rate control and recommended outpatient follow up.    A-fib with RVR, may have been due to medication noncompliance, however, his wife apparently assists with administering his medications.  He was loaded with digoxin 0.25, 0.125, 0.125 IV on 11/7 and his HR trended down to rate controlled of 80-90BPM.  He was continued on xarelto.  Acute on chronic systolic heart failure with EF of 25-30% without Bi-V PM/ICD placement, CXR stable, but BNP was elevated.  He was given IV lasix initially and then transitioned to oral lasix, however, he developed some AKI likely from overdiuresis.  His ACEI, potassium, and lasix were temporarily held and all but ACEI may be restarted at  discharge.    AKI, likely due to overdiuresis.  His ACEI, potassium, and lasix were held.  He has bladder outlet obstruction and intermittently catheterizes and there was concern that he may have been retaining urine causing obstructive uropathy, however, his PVR was only 20mL.  His creatinine trended down after a small fluid bolus and holding these medications.   CAD, continue xarelto, BB, statin   Chronic COPD,  stable.  Continued LABA/ICS and was given xopenex prn.    Depression/anxiety: Stable.  Stopped celexa due to prolonged QTc.    Delirium and agitation superimposed on underlying dementia.  He was continued on effexor and his celexa was discontinued secondary to prolonged QTc.  He was given clonazepam and prn ativan during his admission due to agitation.  His TSH was wnl only two months ago.  He had a sitter present for redirection.  Defer further management to his PCP.  Will have RN visit home to assess living situation and assist with escalation to memory unit if needed as outpatient.    Possible UTI With trace LE and 21-50 WBC on UA s/p 7 days of levofloxacin.  Urine culture demonstrated only 8000 colonies, insignificant growth.   Nicotine abuse and dependence with withdrawal may also be contributing to agitation.  Started nicotine patch.   CLL, stable. Continued gleevec   Consultants:  Cardiology Procedures:  None Antibiotics:  Levofloxacin last dose on 11/7   Discharge Exam: Filed Vitals:   03/06/13 0719  BP:   Pulse: 78  Temp:   Resp: 18   Filed Vitals:   03/06/13 0500 03/06/13 0600 03/06/13 0717 03/06/13 0719  BP:  104/63    Pulse:  75  78  Temp:  97.4 F (36.3 C)    TempSrc:  Axillary    Resp:  20  18  Height:      Weight: 89.132 kg (196 lb 8 oz)     SpO2:  100% 95% 95%   States that he feels well.  Denies CP, SOB.    General: CM, No acute distress  HEENT: NCAT, MMM  Cardiovascular:  IRRR, nl S1, S2, 1+ pulses, warm extremities  Respiratory: Diminished bilateral BS, no focal rales, rhonchi, or wheeze, no increased WOB  Abdomen: NABS, soft, NT/ND  MSK: Normal tone and bulk, no LEE  Neuro: Grossly intact, ambulating without difficulty. Psych: Confused, disoriented, mildly agitated. Knows it is November, but unclear about date or day of week. Knows where he is today.  Discharge Instructions      Discharge Orders   Future Orders Complete By Expires   (HEART  FAILURE PATIENTS) Call MD:  Anytime you have any of the following symptoms: 1) 3 pound weight gain in 24 hours or 5 pounds in 1 week 2) shortness of breath, with or without a dry hacking cough 3) swelling in the hands, feet or stomach 4) if you have to sleep on extra pillows at night in order to breathe.  As directed    Call MD for:  difficulty breathing, headache or visual disturbances  As directed    Call MD for:  extreme fatigue  As directed    Call MD for:  hives  As directed    Call MD for:  persistant dizziness or light-headedness  As directed    Call MD for:  persistant nausea and vomiting  As directed    Call MD for:  severe uncontrolled pain  As directed    Call MD for:  temperature >100.4  As directed    Diet - low sodium heart healthy  As directed    Discharge instructions  As directed    Comments:     You were hospitalized with confusion, chest pain, shortness of breath, and fast heart rate due to atrial fibrillation.  You were restarted on some of your home medications, including carvedilol and digoxin, which slowed your heart rate down to normal range.  You became a little dehydrated during your admission, so please STOP taking your lisinopril this week.  Follow up with either your primary care doctor or Dr. Sharyn Lull in 1 week to have bloodwork done.  Either person can tell you whether it is okay to restart the lisinopril.  Please restart your lasix and potassium tomorrow, but skip tonight's dose.  You had a change on your ECG called "prolonged QTc," which can lead to dangerous heart rhythm problems.  The QTc can be effected by celexa.  Please stop taking celexa and talk to your primary care doctor about alternatives.  You were tested and were negative for urinary tract infection.   Increase activity slowly  As directed        Medication List    STOP taking these medications       citalopram 40 MG tablet  Commonly known as:  CELEXA     levofloxacin 500 MG tablet  Commonly known  as:  LEVAQUIN     lisinopril 40 MG tablet  Commonly known as:  PRINIVIL,ZESTRIL     metoprolol succinate 50 MG 24 hr tablet  Commonly known as:  TOPROL-XL      TAKE these medications       ADVAIR DISKUS 250-50 MCG/DOSE Aepb  Generic drug:  Fluticasone-Salmeterol  Inhale 1 puff into the lungs every 12 (twelve) hours.     albuterol 108 (90 BASE) MCG/ACT inhaler  Commonly known as:  PROVENTIL HFA;VENTOLIN HFA  Inhale 2 puffs into the lungs every 6 (six) hours as needed. For shortness of breath     albuterol (2.5 MG/3ML) 0.083% nebulizer solution  Commonly known as:  PROVENTIL  Take 2.5 mg by nebulization every 6 (six) hours as needed for wheezing or shortness of breath.     ALPRAZolam 1 MG tablet  Commonly known as:  XANAX  Take 1 mg by mouth 3 (three) times daily as needed for anxiety.     atorvastatin 20 MG tablet  Commonly known as:  LIPITOR  Take 1 tablet (20 mg total) by mouth daily at 6 PM.     carvedilol 12.5 MG tablet  Commonly known as:  COREG  Take 1 tablet (12.5 mg total) by mouth 2 (two) times daily with a meal.     docusate sodium 100 MG capsule  Commonly known as:  COLACE  Take 100 mg by mouth as needed. For constipation     furosemide 40 MG tablet  Commonly known as:  LASIX  Take 40 mg by mouth 2 (two) times daily.     imatinib 100 MG tablet  Commonly known as:  GLEEVEC  Take 200 mg by mouth daily. Take with meals and large glass of water.Caution:Chemotherapy     mometasone 0.1 % cream  Commonly known as:  ELOCON  Apply 1 application topically daily as needed. Skin lesions     polyethylene glycol packet  Commonly known as:  MIRALAX / GLYCOLAX  Take 17 g by mouth daily as needed (constipation).     potassium chloride 10 MEQ tablet  Commonly known as:  K-DUR,KLOR-CON  Take 10 mEq by mouth daily.     Rivaroxaban 20 MG Tabs tablet  Commonly known as:  XARELTO  Take 1 tablet (20 mg total) by mouth daily with supper.     spironolactone 25 MG tablet   Commonly known as:  ALDACTONE  Take 1 tablet (25 mg total) by mouth daily.     traMADol 50 MG tablet  Commonly known as:  ULTRAM  Take 50 mg by mouth 3 (three) times daily as needed.     venlafaxine 75 MG tablet  Commonly known as:  EFFEXOR  Take 75 mg by mouth 2 (two) times daily.       Follow-up Information   Follow up with HASANAJ,XAJE A, MD. Schedule an appointment as soon as possible for a visit in 1 week.   Specialty:  Internal Medicine   Contact information:   [address] [place] [address] [phone]       Follow up with Robynn Pane, MD. Schedule an appointment as soon as possible for a visit in 1 week.   Specialty:  Cardiology   Contact information:   [address] [place] [address] [phone]       Schedule an appointment as soon as possible for a visit with Ky Barban, MD. (As needed)    Specialty:  Urology   Contact information:   [address] [place] [address] [phone]        The results of significant diagnostics from this hospitalization (including imaging, microbiology, ancillary and laboratory) are listed below for reference.    Significant Diagnostic Studies: Dg Chest 2 View  02/18/2013   CLINICAL DATA:  Chest pain with worsening shortness of breath.  EXAM: CHEST  2 VIEW  COMPARISON:  01/20/2013.  FINDINGS: Cardiomegaly with pulmonary vascular congestion. Probable interstitial pulmonary edema. Mediastinal contours appear within normal limits and unchanged.  IMPRESSION: Cardiomegaly with pulmonary vascular congestion and mild interstitial pulmonary edema suggesting mild CHF.   Electronically Signed   By: Andreas Newport M.D.   On: 02/18/2013 23:48   Ct Head Wo Contrast  02/19/2013   CLINICAL DATA:  Abnormal cognition. Altered mental status. Chest pain.  EXAM: CT HEAD WITHOUT CONTRAST  TECHNIQUE: Contiguous axial images were obtained from the base of the skull through the vertex without  intravenous contrast.  COMPARISON:  01/10/2013.  FINDINGS: No mass lesion, mass effect, midline shift, hydrocephalus, hemorrhage. No acute territorial cortical ischemia/infarct. Atrophy and chronic ischemic white matter disease is present.More focal chronic ischemic changes present in the left parietal lobe. Paranasal sinuses and mastoid air cells are within normal limits.  IMPRESSION: No interval change or acute intracranial abnormality. Atrophy and chronic ischemic white matter disease.   Electronically Signed   By: Andreas Newport M.D.   On: 02/19/2013 01:31   Dg Chest Portable 1 View  03/03/2013   CLINICAL DATA:  Chest pain and shortness of breath  EXAM: PORTABLE CHEST - 1 VIEW  COMPARISON:  02/18/2013  FINDINGS: The cardiac shadow remains mildly enlarged. No focal infiltrate or sizable effusion is noted. No acute bony abnormality is seen.  IMPRESSION: No acute abnormality noted.   Electronically Signed   By: Alcide Clever M.D.   On: 03/03/2013 17:11    Microbiology: Recent Results (from the past 240 hour(s))  URINE CULTURE     Status: None   Collection Time    03/03/13  7:44 PM      Result Value Range Status  Specimen Description URINE, RANDOM   Final   Special Requests NONE   Final   Culture  Setup Time     Final   Value: 03/03/2013 20:35     Performed at [facility] Count     Final   Value: 8,000 COLONIES/ML     Performed at [facility]   Culture     Final   Value: INSIGNIFICANT GROWTH     Performed at [facility]   Report Status 03/05/2013 FINAL   Final     Labs: Basic Metabolic Panel:  Recent Labs Lab 03/03/13 1656 03/04/13 0357 03/05/13 0600 03/06/13 0500  NA 141 141 135 136  K 3.8 3.9 4.2 4.4  CL 108 102 98 100  CO2 26 28 27 30   GLUCOSE 98 115* 116* 106*  BUN 14 16 26* 27*  CREATININE 1.09 1.16 1.74* 1.39*  CALCIUM 9.4 9.2 9.4 9.7   Liver Function Tests:  Recent Labs Lab 03/04/13 0357  AST 15  ALT 13  ALKPHOS 81   BILITOT 0.5  PROT 7.0  ALBUMIN 3.7   No results found for this basename: LIPASE, AMYLASE,  in the last 168 hours No results found for this basename: AMMONIA,  in the last 168 hours CBC:  Recent Labs Lab 03/03/13 1656 03/04/13 0357 03/05/13 0600 03/06/13 0500  WBC 8.1 13.7* 14.2* 9.9  NEUTROABS 4.9  --   --   --   HGB 12.4* 14.0 13.1 13.3  HCT 40.2 44.6 41.9 42.4  MCV 87.6 88.0 86.9 88.0  PLT 289 343 355 301   Cardiac Enzymes:  Recent Labs Lab 03/03/13 1656 03/03/13 2220 03/04/13 0356 03/04/13 0935  TROPONINI <0.30 <0.30 <0.30 <0.30   BNP: BNP (last 3 results)  Recent Labs  01/20/13 0507 02/18/13 2300 03/03/13 1656  PROBNP 915.3* 2611.0* 3415.0*   CBG: No results found for this basename: GLUCAP,  in the last 168 hours  Time coordinating discharge: 45 minutes  Signed:  Merna Baldi  [facility] 03/06/2013, 11:07 AM

## 2013-03-06 NOTE — Progress Notes (Signed)
03/06/13 1140 Patient being discharged home with wife. Wife arrived to pick up patient, notified MD working on discharge. Instructed to wait until discharge instructions provided before leaving. Wife and patient left floor without notifying nursing staff or waiting for discharge instructions. Nursing called wife, who stated "he wanted to leave, you know how he is. i would rather have the paperwork, so I'll come back up and get it". Notified Dr. Malachi Bonds. Wife returned without patient. Reviewed discharge instructions with wife, copy given with medication due times listed. Discussed follow-up appointments and when to call MD. Verbalized understanding. States will schedule appointments. Discussed home health RN order, wife agreeable to home health RN, requests [facility]. States has not been pleased with [place] nurse aide services. Notified [facility] of referral. Order, d/c summary, facesheet faxed as requested. Earnstine Regal, RN

## 2013-03-06 NOTE — Progress Notes (Signed)
ANTICOAGULATION CONSULT NOTE - Follow Up Consult  Pharmacy Consult for Xarelto (home medication) Indication: atrial fibrillation  No Known Allergies  Patient Measurements: Height: 6' (182.9 cm) Weight: 196 lb 8 oz (89.132 kg) IBW/kg (Calculated) : 77.6  Vital Signs: Temp: 97.4 F (36.3 C) (11/09 0600) Temp src: Axillary (11/09 0600) BP: 104/63 mmHg (11/09 0600) Pulse Rate: 78 (11/09 0719)  Labs:  Recent Labs  03/03/13 2220 03/04/13 0356 03/04/13 0357 03/04/13 0935 03/05/13 0600 03/06/13 0500  HGB  --   --  14.0  --  13.1 13.3  HCT  --   --  44.6  --  41.9 42.4  PLT  --   --  343  --  355 301  CREATININE  --   --  1.16  --  1.74* 1.39*  TROPONINI <0.30 <0.30  --  <0.30  --   --    Estimated Creatinine Clearance: 56.6 ml/min (by C-G formula based on Cr of 1.39).  Medications:  Prescriptions prior to admission  Medication Sig Dispense Refill  . albuterol (PROVENTIL HFA;VENTOLIN HFA) 108 (90 BASE) MCG/ACT inhaler Inhale 2 puffs into the lungs every 6 (six) hours as needed. For shortness of breath       . albuterol (PROVENTIL) (2.5 MG/3ML) 0.083% nebulizer solution Take 2.5 mg by nebulization every 6 (six) hours as needed for wheezing or shortness of breath.      . ALPRAZolam (XANAX) 1 MG tablet Take 1 mg by mouth 3 (three) times daily as needed for anxiety.      Marland Kitchen atorvastatin (LIPITOR) 20 MG tablet Take 1 tablet (20 mg total) by mouth daily at 6 PM.  30 tablet  3  . carvedilol (COREG) 12.5 MG tablet Take 1 tablet (12.5 mg total) by mouth 2 (two) times daily with a meal.  60 tablet  3  . citalopram (CELEXA) 40 MG tablet Take 0.5 tablets (20 mg total) by mouth daily.  30 tablet  1  . docusate sodium (COLACE) 100 MG capsule Take 100 mg by mouth as needed. For constipation       . Fluticasone-Salmeterol (ADVAIR DISKUS) 250-50 MCG/DOSE AEPB Inhale 1 puff into the lungs every 12 (twelve) hours.        . furosemide (LASIX) 40 MG tablet Take 40 mg by mouth 2 (two) times daily.       Marland Kitchen imatinib (GLEEVEC) 100 MG tablet Take 200 mg by mouth daily. Take with meals and large glass of water.Caution:Chemotherapy      . lisinopril (PRINIVIL,ZESTRIL) 40 MG tablet Take 40 mg by mouth daily.      . metoprolol succinate (TOPROL-XL) 50 MG 24 hr tablet Take 50 mg by mouth daily.      . mometasone (ELOCON) 0.1 % cream Apply 1 application topically daily as needed. Skin lesions       . polyethylene glycol (MIRALAX / GLYCOLAX) packet Take 17 g by mouth daily as needed (constipation).      . potassium chloride (K-DUR,KLOR-CON) 10 MEQ tablet Take 10 mEq by mouth daily.      . Rivaroxaban (XARELTO) 20 MG TABS tablet Take 1 tablet (20 mg total) by mouth daily with supper.  30 tablet  3  . spironolactone (ALDACTONE) 25 MG tablet Take 1 tablet (25 mg total) by mouth daily.  30 tablet  3  . traMADol (ULTRAM) 50 MG tablet Take 50 mg by mouth 3 (three) times daily as needed.      . venlafaxine (EFFEXOR) 75 MG tablet  Take 75 mg by mouth 2 (two) times daily.      Marland Kitchen levofloxacin (LEVAQUIN) 500 MG tablet Take 1 tablet (500 mg total) by mouth daily.  7 tablet  0   Assessment: 67yo male on Xarelto 20mg  po daily at home.  Since admission pt's SCr bumped up but is now returning to baseline.  Estimated Creatinine Clearance: 56.6 ml/min (by C-G formula based on Cr of 1.39).  Goal of Therapy:  Monitor platelets by anticoagulation protocol: Yes   Plan:  Xarelto 20mg  po daily  Monitor CBC and renal fxn.  Margo Aye, Walida Cajas A 03/06/2013,10:25 AM

## 2013-07-29 ENCOUNTER — Encounter (HOSPITAL_COMMUNITY): Payer: Self-pay | Admitting: Emergency Medicine

## 2013-07-29 ENCOUNTER — Emergency Department (HOSPITAL_COMMUNITY): Payer: Medicare Other

## 2013-07-29 ENCOUNTER — Emergency Department (HOSPITAL_COMMUNITY)
Admission: EM | Admit: 2013-07-29 | Discharge: 2013-07-29 | Disposition: A | Discharge status: Home / Self Care | Payer: Medicare Other | Attending: Emergency Medicine | Admitting: Emergency Medicine

## 2013-07-29 DIAGNOSIS — Z9981 Dependence on supplemental oxygen: Secondary | ICD-10-CM | POA: Insufficient documentation

## 2013-07-29 DIAGNOSIS — Z8546 Personal history of malignant neoplasm of prostate: Secondary | ICD-10-CM | POA: Insufficient documentation

## 2013-07-29 DIAGNOSIS — J441 Chronic obstructive pulmonary disease with (acute) exacerbation: Secondary | ICD-10-CM | POA: Insufficient documentation

## 2013-07-29 DIAGNOSIS — I129 Hypertensive chronic kidney disease with stage 1 through stage 4 chronic kidney disease, or unspecified chronic kidney disease: Secondary | ICD-10-CM | POA: Insufficient documentation

## 2013-07-29 DIAGNOSIS — IMO0002 Reserved for concepts with insufficient information to code with codable children: Secondary | ICD-10-CM | POA: Insufficient documentation

## 2013-07-29 DIAGNOSIS — Z792 Long term (current) use of antibiotics: Secondary | ICD-10-CM | POA: Insufficient documentation

## 2013-07-29 DIAGNOSIS — N189 Chronic kidney disease, unspecified: Secondary | ICD-10-CM | POA: Insufficient documentation

## 2013-07-29 DIAGNOSIS — Z87442 Personal history of urinary calculi: Secondary | ICD-10-CM | POA: Insufficient documentation

## 2013-07-29 DIAGNOSIS — Z862 Personal history of diseases of the blood and blood-forming organs and certain disorders involving the immune mechanism: Secondary | ICD-10-CM | POA: Insufficient documentation

## 2013-07-29 DIAGNOSIS — F411 Generalized anxiety disorder: Secondary | ICD-10-CM | POA: Insufficient documentation

## 2013-07-29 DIAGNOSIS — R000 Tachycardia, unspecified: Secondary | ICD-10-CM | POA: Insufficient documentation

## 2013-07-29 DIAGNOSIS — N390 Urinary tract infection, site not specified: Secondary | ICD-10-CM | POA: Insufficient documentation

## 2013-07-29 DIAGNOSIS — F17200 Nicotine dependence, unspecified, uncomplicated: Secondary | ICD-10-CM | POA: Insufficient documentation

## 2013-07-29 DIAGNOSIS — Z79899 Other long term (current) drug therapy: Secondary | ICD-10-CM | POA: Insufficient documentation

## 2013-07-29 DIAGNOSIS — I5020 Unspecified systolic (congestive) heart failure: Secondary | ICD-10-CM | POA: Insufficient documentation

## 2013-07-29 DIAGNOSIS — R0600 Dyspnea, unspecified: Secondary | ICD-10-CM

## 2013-07-29 DIAGNOSIS — I4891 Unspecified atrial fibrillation: Secondary | ICD-10-CM | POA: Insufficient documentation

## 2013-07-29 DIAGNOSIS — Z8701 Personal history of pneumonia (recurrent): Secondary | ICD-10-CM | POA: Insufficient documentation

## 2013-07-29 LAB — URINALYSIS, ROUTINE W REFLEX MICROSCOPIC
BILIRUBIN URINE: NEGATIVE
GLUCOSE, UA: NEGATIVE mg/dL
Ketones, ur: NEGATIVE mg/dL
Nitrite: POSITIVE — AB
PH: 6 (ref 5.0–8.0)
Protein, ur: NEGATIVE mg/dL
SPECIFIC GRAVITY, URINE: 1.01 (ref 1.005–1.030)
Urobilinogen, UA: 0.2 mg/dL (ref 0.0–1.0)

## 2013-07-29 LAB — CBC
HCT: 37.6 % — ABNORMAL LOW (ref 39.0–52.0)
Hemoglobin: 12.1 g/dL — ABNORMAL LOW (ref 13.0–17.0)
MCH: 29.4 pg (ref 26.0–34.0)
MCHC: 32.2 g/dL (ref 30.0–36.0)
MCV: 91.3 fL (ref 78.0–100.0)
Platelets: 225 10*3/uL (ref 150–400)
RBC: 4.12 MIL/uL — AB (ref 4.22–5.81)
RDW: 15.9 % — AB (ref 11.5–15.5)
WBC: 7.7 10*3/uL (ref 4.0–10.5)

## 2013-07-29 LAB — COMPREHENSIVE METABOLIC PANEL
ALBUMIN: 3.4 g/dL — AB (ref 3.5–5.2)
ALK PHOS: 77 U/L (ref 39–117)
ALT: 15 U/L (ref 0–53)
AST: 17 U/L (ref 0–37)
BUN: 16 mg/dL (ref 6–23)
CHLORIDE: 104 meq/L (ref 96–112)
CO2: 29 meq/L (ref 19–32)
CREATININE: 1.01 mg/dL (ref 0.50–1.35)
Calcium: 9.5 mg/dL (ref 8.4–10.5)
GFR calc non Af Amer: 74 mL/min — ABNORMAL LOW (ref >90)
GFR, EST AFRICAN AMERICAN: 86 mL/min — AB (ref >90)
Glucose, Bld: 100 mg/dL — ABNORMAL HIGH (ref 70–99)
Potassium: 3.9 mEq/L (ref 3.7–5.3)
Sodium: 142 mEq/L (ref 137–147)
Total Bilirubin: 0.2 mg/dL — ABNORMAL LOW (ref 0.3–1.2)
Total Protein: 6.5 g/dL (ref 6.0–8.3)

## 2013-07-29 LAB — URINE MICROSCOPIC-ADD ON

## 2013-07-29 LAB — TROPONIN I: Troponin I: <0.3 ng/mL (ref <0.30)

## 2013-07-29 LAB — PRO B NATRIURETIC PEPTIDE: PRO B NATRI PEPTIDE: 2512 pg/mL — AB (ref 0–125)

## 2013-07-29 MED ORDER — IPRATROPIUM-ALBUTEROL 0.5-2.5 (3) MG/3ML IN SOLN
3.0000 mL | Freq: Once | RESPIRATORY_TRACT | Status: AC
  Administered 2013-07-29: 3 mL via RESPIRATORY_TRACT
  Filled 2013-07-29: qty 3

## 2013-07-29 MED ORDER — PREDNISONE 10 MG PO TABS: 20.0000 mg | ORAL_TABLET | Freq: Two times a day (BID) | ORAL | Status: DC

## 2013-07-29 MED ORDER — CEPHALEXIN 500 MG PO CAPS: 500.0000 mg | ORAL_CAPSULE | Freq: Four times a day (QID) | ORAL | Status: DC

## 2013-07-29 NOTE — ED Provider Notes (Signed)
CSN: [id number]     Arrival date & time 07/29/13  0039 History   First MD Initiated Contact with Patient 07/29/13 0124     Chief Complaint  Patient presents with  . Shortness of Breath  . Tachycardia     (Consider location/radiation/quality/duration/timing/severity/associated sxs/prior Treatment) HPI Comments: Patient is a 68 year old male with history of COPD on home oxygen. He presents this evening with complaints of shortness of breath intermittently for the past several months. He denies any fevers or chills. He denies any chest pain or swelling in his legs.  Patient is a 68 y.o. male presenting with shortness of breath. The history is provided by the patient.  Shortness of Breath Severity:  Moderate Onset quality:  Gradual Duration:  5 months Timing:  Intermittent Progression:  Worsening Chronicity:  Chronic Context: activity   Relieved by:  Nothing Worsened by:  Nothing tried   Past Medical History  Diagnosis Date  . COPD (chronic obstructive pulmonary disease)   . Hypertension   . Pneumonia   . Systolic heart failure   . AF (atrial fibrillation) 08/21/2011  . Anxiety   . History of kidney stones   . Cancer   . Prostate cancer   . Leukemia   . Leukemia   . Chronic kidney disease    Past Surgical History  Procedure Laterality Date  . Colon surgery    . Prostate surgery  1999  . Transurethral resection of bladder tumor  08/25/2011    Procedure: TRANSURETHRAL RESECTION OF BLADDER TUMOR (TURBT);  Surgeon: Marissa Nestle, MD;  Location: [facility];  Service: Urology;  Laterality: N/A;  Transurethral Resection of Bladder Neck  . Cystoscopy with urethral dilatation  12/30/2011    Procedure: CYSTOSCOPY WITH URETHRAL DILATATION;  Surgeon: Marissa Nestle, MD;  Location: [facility];  Service: Urology;;  . Collene Mares  12/30/2011    Procedure: CYSTOSCOPY/URETHROTOMY;  Surgeon: Marissa Nestle, MD;  Location: [facility];  Service: Urology;;  . Foreign body removal  12/30/2011     Procedure: FOREIGN BODY REMOVAL;  Surgeon: Marissa Nestle, MD;  Location: [facility];  Service: Urology;;  Transurethral Removal of 4 clips from bladder neck  . Partial colectomy  2012    [facility]  . Transurethral resection of bladder neck  07/27/2012    Procedure: TRANSURETHRAL RESECTION OF BLADDER NECK;  Surgeon: Marissa Nestle, MD;  Location: [facility];  Service: Urology;;  . Cystoscopy with urethral dilatation  07/27/2012    Procedure: CYSTOSCOPY WITH URETHRAL DILATATION;  Surgeon: Marissa Nestle, MD;  Location: [facility];  Service: Urology;;  . Foreign body removal  07/27/2012    Procedure: FOREIGN BODY REMOVAL-CLIPS BLADDER NECK;  Surgeon: Marissa Nestle, MD;  Location: [facility];  Service: Urology;;  . Cataract extraction w/phaco Right 10/04/2012    Procedure: CATARACT EXTRACTION PHACO AND INTRAOCULAR LENS PLACEMENT ([place]);  Surgeon: Tonny Branch, MD;  Location: [facility];  Service: Ophthalmology;  Laterality: Right;  CDE:12.16  . Eye surgery    . Cataract extraction w/phaco Left 10/18/2012    Procedure: CATARACT EXTRACTION PHACO AND INTRAOCULAR LENS PLACEMENT ([facility]);  Surgeon: Tonny Branch, MD;  Location: [facility];  Service: Ophthalmology;  Laterality: Left;  CDE:14.42   Family History  Problem Relation Age of Onset  . Heart attack Mother   . Cancer Father   . Stroke Father    History  Substance Use Topics  . Smoking status: Current Every Day Smoker -- 0.50 packs/day for 42 years  Types: Cigarettes  . Smokeless tobacco: Former Systems developer  . Alcohol Use: No    Review of Systems  Respiratory: Positive for shortness of breath.   All other systems reviewed and are negative.      Allergies  Review of patient's allergies indicates no known allergies.  Home Medications   Current Outpatient Rx  Name  Route  Sig  Dispense  Refill  . albuterol (PROVENTIL HFA;VENTOLIN HFA) 108 (90 BASE) MCG/ACT inhaler   Inhalation   Inhale 2 puffs into the lungs every 6 (six) hours as needed. For shortness of  breath          . albuterol (PROVENTIL) (2.5 MG/3ML) 0.083% nebulizer solution   Nebulization   Take 2.5 mg by nebulization every 6 (six) hours as needed for wheezing or shortness of breath.         . ALPRAZolam (XANAX) 1 MG tablet   Oral   Take 1 mg by mouth 3 (three) times daily as needed for anxiety.         Marland Kitchen atorvastatin (LIPITOR) 20 MG tablet   Oral   Take 1 tablet (20 mg total) by mouth daily at 6 PM.   30 tablet   3   . carvedilol (COREG) 12.5 MG tablet   Oral   Take 1 tablet (12.5 mg total) by mouth 2 (two) times daily with a meal.   60 tablet   3   . cephALEXin (KEFLEX) 500 MG capsule   Oral   Take 1 capsule (500 mg total) by mouth 4 (four) times daily.   28 capsule   0   . docusate sodium (COLACE) 100 MG capsule   Oral   Take 100 mg by mouth as needed. For constipation          . Fluticasone-Salmeterol (ADVAIR DISKUS) 250-50 MCG/DOSE AEPB   Inhalation   Inhale 1 puff into the lungs every 12 (twelve) hours.           . furosemide (LASIX) 40 MG tablet   Oral   Take 40 mg by mouth 2 (two) times daily.         Marland Kitchen imatinib (GLEEVEC) 100 MG tablet   Oral   Take 200 mg by mouth daily. Take with meals and large glass of water.Caution:Chemotherapy         . mometasone (ELOCON) 0.1 % cream   Topical   Apply 1 application topically daily as needed. Skin lesions          . polyethylene glycol (MIRALAX / GLYCOLAX) packet   Oral   Take 17 g by mouth daily as needed (constipation).         . potassium chloride (K-DUR,KLOR-CON) 10 MEQ tablet   Oral   Take 10 mEq by mouth daily.         . predniSONE (DELTASONE) 10 MG tablet   Oral   Take 2 tablets (20 mg total) by mouth 2 (two) times daily.   20 tablet   0   . Rivaroxaban (XARELTO) 20 MG TABS tablet   Oral   Take 1 tablet (20 mg total) by mouth daily with supper.   30 tablet   3   . spironolactone (ALDACTONE) 25 MG tablet   Oral   Take 1 tablet (25 mg total) by mouth daily.   30  tablet   3   . traMADol (ULTRAM) 50 MG tablet   Oral   Take 50 mg by mouth 3 (three) times daily  as needed.         . venlafaxine (EFFEXOR) 75 MG tablet   Oral   Take 75 mg by mouth 2 (two) times daily.          BP 136/99  Pulse 113  Temp(Src) 97.8 F (36.6 C) (Oral)  Resp 20  Ht 6\' 1"  (1.854 m)  Wt 200 lb (90.719 kg)  BMI 26.39 kg/m2  SpO2 94% Physical Exam  Nursing note and vitals reviewed. Constitutional: He is oriented to person, place, and time. No distress.  Patient is a 68 year old male who appears chronically ill. He is in no acute distress.  HENT:  Head: Normocephalic and atraumatic.  Mouth/Throat: Oropharynx is clear and moist.  Neck: Normal range of motion. Neck supple.  Cardiovascular: Normal rate, regular rhythm and normal heart sounds.   No murmur heard. Pulmonary/Chest: Effort normal. No respiratory distress. He has wheezes.  There are slight expiratory wheezes bilaterally.  Abdominal: Soft. Bowel sounds are normal. He exhibits no distension. There is no tenderness.  Musculoskeletal: Normal range of motion. He exhibits no edema.  Lymphadenopathy:    He has no cervical adenopathy.  Neurological: He is alert and oriented to person, place, and time.  Skin: Skin is warm and dry. He is not diaphoretic.    ED Course  Procedures (including critical care time) Labs Review Labs Reviewed  CBC - Abnormal; Notable for the following:    RBC 4.12 (*)    Hemoglobin 12.1 (*)    HCT 37.6 (*)    RDW 15.9 (*)    All other components within normal limits  COMPREHENSIVE METABOLIC PANEL - Abnormal; Notable for the following:    Glucose, Bld 100 (*)    Albumin 3.4 (*)    Total Bilirubin 0.2 (*)    GFR calc non Af Amer 74 (*)    GFR calc Af Amer 86 (*)    All other components within normal limits  URINALYSIS, ROUTINE W REFLEX MICROSCOPIC - Abnormal; Notable for the following:    Color, Urine STRAW (*)    APPearance HAZY (*)    Hgb urine dipstick MODERATE (*)     Nitrite POSITIVE (*)    Leukocytes, UA SMALL (*)    All other components within normal limits  PRO B NATRIURETIC PEPTIDE - Abnormal; Notable for the following:    Pro B Natriuretic peptide (BNP) 2512.0 (*)    All other components within normal limits  URINE MICROSCOPIC-ADD ON - Abnormal; Notable for the following:    Bacteria, UA MANY (*)    All other components within normal limits  TROPONIN I   Imaging Review Dg Chest 2 View  07/29/2013   CLINICAL DATA:  Shortness of breath for several weeks.  Tachycardia.  EXAM: CHEST  2 VIEW  COMPARISON:  Chest radiograph performed 07/27/2013  FINDINGS: The lungs are well-aerated. Mild bibasilar airspace opacities likely reflect atelectasis. There is no evidence of pleural effusion or pneumothorax.  The heart is borderline enlarged. No acute osseous abnormalities are seen. A prominent bone island is seen at the left fifth lateral rib.  IMPRESSION: Mild bibasilar airspace opacities likely reflect atelectasis. Borderline cardiomegaly.   Electronically Signed   By: Garald Balding M.D.   On: 07/29/2013 02:25     EKG Interpretation   Date/Time:  Friday July 29 2013 00:59:02 EDT Ventricular Rate:  115 PR Interval:    QRS Duration: 132 QT Interval:  342 QTC Calculation: 473 R Axis:   -59 Text Interpretation:  Atrial fibrillation with rapid ventricular response  with premature ventricular or aberrantly conducted complexes Left axis  deviation Non-specific intra-ventricular conduction block Cannot rule out  Anteroseptal infarct (cited on or before 20-Feb-2013) T wave abnormality,  consider lateral ischemia Abnormal ECG When compared with ECG of  04-Mar-2013 13:10, No significant change was found Confirmed by DELOS  MD,  Sholom Dulude ([id number]) on 07/29/2013 4:05:22 AM      MDM   Final diagnoses:  Dyspnea  COPD exacerbation  UTI (urinary tract infection)    Patient with history of COPD, A. fib. Presents with worsening breathing over the past several  months. Workup reveals essentially unremarkable laboratory studies, troponin, and BNP are consistent with baseline. His urinalysis reveals evidence for a UTI which will be treated with Keflex. The chest x-ray does not reveal any pneumonia. He is in no distress and oxygen saturations are adequate. Feels that he is appropriate for discharge. He will be prescribed prednisone as well. He is to return if he worsens and followup with his primary Dr. in the next week.    Veryl Speak, MD 07/29/13 [phone]

## 2013-07-29 NOTE — Discharge Instructions (Signed)
Keflex as prescribed.  Prednisone as prescribed.  Continue your breathing treatments every 4 hours as needed.  Return to the ER if you develop worsening breathing or chest pain.   Chronic Obstructive Pulmonary Disease Chronic obstructive pulmonary disease (COPD) is a common lung condition in which airflow from the lungs is limited. COPD is a general term that can be used to describe many different lung problems that limit airflow, including both chronic bronchitis and emphysema. If you have COPD, your lung function will probably never return to normal, but there are measures you can take to improve lung function and make yourself feel better.  CAUSES   Smoking (common).   Exposure to secondhand smoke.   Genetic problems.  Chronic inflammatory lung diseases or recurrent infections. SYMPTOMS   Shortness of breath, especially with physical activity.   Deep, persistent (chronic) cough with a large amount of thick mucus.   Wheezing.   Rapid breaths (tachypnea).   Gray or bluish discoloration (cyanosis) of the skin, especially in fingers, toes, or lips.   Fatigue.   Weight loss.   Frequent infections or episodes when breathing symptoms become much worse (exacerbations).   Chest tightness. DIAGNOSIS  Your healthcare provider will take a medical history and perform a physical examination to make the initial diagnosis. Additional tests for COPD may include:   Lung (pulmonary) function tests.  Chest X-ray.  CT scan.  Blood tests. TREATMENT  Treatment available to help you feel better when you have COPD include:   Inhaler and nebulizer medicines. These help manage the symptoms of COPD and make your breathing more comfortable  Supplemental oxygen. Supplemental oxygen is only helpful if you have a low oxygen level in your blood.   Exercise and physical activity. These are beneficial for nearly all people with COPD. Some people may also benefit from a pulmonary  rehabilitation program. HOME CARE INSTRUCTIONS   Take all medicines (inhaled or pills) as directed by your health care provider.  Only take over-the-counter or prescription medicines for pain, fever, or discomfort as directed by your health care provider.   Avoid over-the-counter medicines or cough syrups that dry up your airway (such as antihistamines) and slow down the elimination of secretions unless instructed otherwise by your healthcare provider.   If you are a smoker, the most important thing that you can do is stop smoking. Continuing to smoke will cause further lung damage and breathing trouble. Ask your health care provider for help with quitting smoking. He or she can direct you to community resources or hospitals that provide support.  Avoid exposure to irritants such as smoke, chemicals, and fumes that aggravate your breathing.  Use oxygen therapy and pulmonary rehabilitation if directed by your health care provider. If you require home oxygen therapy, ask your healthcare provider whether you should purchase a pulse oximeter to measure your oxygen level at home.   Avoid contact with individuals who have a contagious illness.  Avoid extreme temperature and humidity changes.  Eat healthy foods. Eating smaller, more frequent meals and resting before meals may help you maintain your strength.  Stay active, but balance activity with periods of rest. Exercise and physical activity will help you maintain your ability to do things you want to do.  Preventing infection and hospitalization is very important when you have COPD. Make sure to receive all the vaccines your health care provider recommends, especially the pneumococcal and influenza vaccines. Ask your healthcare provider whether you need a pneumonia vaccine.  Learn and  use relaxation techniques to manage stress.  Learn and use controlled breathing techniques as directed by your health care provider. Controlled breathing  techniques include:   Pursed lip breathing. Start by breathing in (inhaling) through your nose for 1 second. Then, purse your lips as if you were going to whistle and breathe out (exhale) through the pursed lips for 2 seconds.   Diaphragmatic breathing. Start by putting one hand on your abdomen just above your waist. Inhale slowly through your nose. The hand on your abdomen should move out. Then purse your lips and exhale slowly. You should be able to feel the hand on your abdomen moving in as you exhale.   Learn and use controlled coughing to clear mucus from your lungs. Controlled coughing is a series of short, progressive coughs. The steps of controlled coughing are:  1. Lean your head slightly forward.  2. Breathe in deeply using diaphragmatic breathing.  3. Try to hold your breath for 3 seconds.  4. Keep your mouth slightly open while coughing twice.  5. Spit any mucus out into a tissue.  6. Rest and repeat the steps once or twice as needed. SEEK MEDICAL CARE IF:   You are coughing up more mucus than usual.   There is a change in the color or thickness of your mucus.   Your breathing is more labored than usual.   Your breathing is faster than usual.  SEEK IMMEDIATE MEDICAL CARE IF:   You have shortness of breath while you are resting.   You have shortness of breath that prevents you from:  Being able to talk.   Performing your usual physical activities.   You have chest pain lasting longer than 5 minutes.   Your skin color is more cyanotic than usual.  You measure low oxygen saturations for longer than 5 minutes with a pulse oximeter. MAKE SURE YOU:   Understand these instructions.  Will watch your condition.  Will get help right away if you are not doing well or get worse. Document Released: 01/22/2005 Document Revised: 02/02/2013 Document Reviewed: 12/09/2012 [facility] Patient Information 2014 [place], [place].

## 2013-07-29 NOTE — ED Notes (Signed)
Pt states he has been sob for x 5 month, states it was worse tonight. Pt uses 2 1/2 L 02 at home. Dry cough

## 2013-07-29 NOTE — ED Notes (Signed)
Pt complaining of chest pain, EKG done, gave to MD

## 2013-09-14 ENCOUNTER — Emergency Department (HOSPITAL_COMMUNITY)
Admission: EM | Admit: 2013-09-14 | Discharge: 2013-09-15 | Disposition: A | Discharge status: Home / Self Care | Payer: Medicare Other | Attending: Emergency Medicine | Admitting: Emergency Medicine

## 2013-09-14 ENCOUNTER — Encounter (HOSPITAL_COMMUNITY): Payer: Self-pay | Admitting: Emergency Medicine

## 2013-09-14 ENCOUNTER — Emergency Department (HOSPITAL_COMMUNITY): Payer: Medicare Other

## 2013-09-14 DIAGNOSIS — N189 Chronic kidney disease, unspecified: Secondary | ICD-10-CM | POA: Insufficient documentation

## 2013-09-14 DIAGNOSIS — R0600 Dyspnea, unspecified: Secondary | ICD-10-CM

## 2013-09-14 DIAGNOSIS — G8929 Other chronic pain: Secondary | ICD-10-CM | POA: Insufficient documentation

## 2013-09-14 DIAGNOSIS — I129 Hypertensive chronic kidney disease with stage 1 through stage 4 chronic kidney disease, or unspecified chronic kidney disease: Secondary | ICD-10-CM | POA: Insufficient documentation

## 2013-09-14 DIAGNOSIS — F411 Generalized anxiety disorder: Secondary | ICD-10-CM | POA: Insufficient documentation

## 2013-09-14 DIAGNOSIS — J441 Chronic obstructive pulmonary disease with (acute) exacerbation: Secondary | ICD-10-CM | POA: Insufficient documentation

## 2013-09-14 DIAGNOSIS — IMO0002 Reserved for concepts with insufficient information to code with codable children: Secondary | ICD-10-CM | POA: Insufficient documentation

## 2013-09-14 DIAGNOSIS — Z8701 Personal history of pneumonia (recurrent): Secondary | ICD-10-CM | POA: Insufficient documentation

## 2013-09-14 DIAGNOSIS — Z792 Long term (current) use of antibiotics: Secondary | ICD-10-CM | POA: Insufficient documentation

## 2013-09-14 DIAGNOSIS — Z8546 Personal history of malignant neoplasm of prostate: Secondary | ICD-10-CM | POA: Insufficient documentation

## 2013-09-14 DIAGNOSIS — Z862 Personal history of diseases of the blood and blood-forming organs and certain disorders involving the immune mechanism: Secondary | ICD-10-CM | POA: Insufficient documentation

## 2013-09-14 DIAGNOSIS — F17200 Nicotine dependence, unspecified, uncomplicated: Secondary | ICD-10-CM | POA: Insufficient documentation

## 2013-09-14 DIAGNOSIS — I4891 Unspecified atrial fibrillation: Secondary | ICD-10-CM | POA: Insufficient documentation

## 2013-09-14 DIAGNOSIS — Z7901 Long term (current) use of anticoagulants: Secondary | ICD-10-CM | POA: Insufficient documentation

## 2013-09-14 DIAGNOSIS — I5020 Unspecified systolic (congestive) heart failure: Secondary | ICD-10-CM | POA: Insufficient documentation

## 2013-09-14 DIAGNOSIS — Z87442 Personal history of urinary calculi: Secondary | ICD-10-CM | POA: Insufficient documentation

## 2013-09-14 DIAGNOSIS — Z79899 Other long term (current) drug therapy: Secondary | ICD-10-CM | POA: Insufficient documentation

## 2013-09-14 LAB — CBC WITH DIFFERENTIAL/PLATELET
BASOS ABS: 0 10*3/uL (ref 0.0–0.1)
BASOS PCT: 0 % (ref 0–1)
EOS PCT: 3 % (ref 0–5)
Eosinophils Absolute: 0.3 10*3/uL (ref 0.0–0.7)
HCT: 42.1 % (ref 39.0–52.0)
Hemoglobin: 13.7 g/dL (ref 13.0–17.0)
Lymphocytes Relative: 28 % (ref 12–46)
Lymphs Abs: 2.5 10*3/uL (ref 0.7–4.0)
MCH: 29.8 pg (ref 26.0–34.0)
MCHC: 32.5 g/dL (ref 30.0–36.0)
MCV: 91.7 fL (ref 78.0–100.0)
Monocytes Absolute: 0.6 10*3/uL (ref 0.1–1.0)
Monocytes Relative: 6 % (ref 3–12)
NEUTROS ABS: 5.6 10*3/uL (ref 1.7–7.7)
Neutrophils Relative %: 63 % (ref 43–77)
PLATELETS: 253 10*3/uL (ref 150–400)
RBC: 4.59 MIL/uL (ref 4.22–5.81)
RDW: 15.2 % (ref 11.5–15.5)
WBC: 9 10*3/uL (ref 4.0–10.5)

## 2013-09-14 LAB — COMPREHENSIVE METABOLIC PANEL
ALBUMIN: 3.5 g/dL (ref 3.5–5.2)
ALT: 12 U/L (ref 0–53)
AST: 13 U/L (ref 0–37)
Alkaline Phosphatase: 75 U/L (ref 39–117)
BUN: 31 mg/dL — ABNORMAL HIGH (ref 6–23)
CALCIUM: 9.1 mg/dL (ref 8.4–10.5)
CO2: 27 meq/L (ref 19–32)
Chloride: 100 mEq/L (ref 96–112)
Creatinine, Ser: 1.34 mg/dL (ref 0.50–1.35)
GFR calc Af Amer: 61 mL/min — ABNORMAL LOW (ref >90)
GFR, EST NON AFRICAN AMERICAN: 53 mL/min — AB (ref >90)
Glucose, Bld: 108 mg/dL — ABNORMAL HIGH (ref 70–99)
Potassium: 4.5 mEq/L (ref 3.7–5.3)
SODIUM: 139 meq/L (ref 137–147)
Total Bilirubin: 0.5 mg/dL (ref 0.3–1.2)
Total Protein: 6.5 g/dL (ref 6.0–8.3)

## 2013-09-14 LAB — PRO B NATRIURETIC PEPTIDE: PRO B NATRI PEPTIDE: 1048 pg/mL — AB (ref 0–125)

## 2013-09-14 LAB — TROPONIN I: Troponin I: <0.3 ng/mL (ref <0.30)

## 2013-09-14 MED ORDER — METHYLPREDNISOLONE SODIUM SUCC 125 MG IJ SOLR
125.0000 mg | Freq: Once | INTRAMUSCULAR | Status: AC
  Administered 2013-09-14: 125 mg via INTRAVENOUS
  Filled 2013-09-14: qty 2

## 2013-09-14 MED ORDER — FUROSEMIDE 10 MG/ML IJ SOLN
60.0000 mg | Freq: Once | INTRAMUSCULAR | Status: AC
  Administered 2013-09-14: 60 mg via INTRAVENOUS
  Filled 2013-09-14: qty 6

## 2013-09-14 MED ORDER — ALBUTEROL SULFATE (2.5 MG/3ML) 0.083% IN NEBU
5.0000 mg | INHALATION_SOLUTION | Freq: Once | RESPIRATORY_TRACT | Status: AC
  Administered 2013-09-14: 5 mg via RESPIRATORY_TRACT
  Filled 2013-09-14: qty 6

## 2013-09-14 MED ORDER — IPRATROPIUM BROMIDE 0.02 % IN SOLN
0.5000 mg | Freq: Once | RESPIRATORY_TRACT | Status: AC
  Administered 2013-09-14: 0.5 mg via RESPIRATORY_TRACT
  Filled 2013-09-14: qty 2.5

## 2013-09-14 MED ORDER — TRAMADOL HCL 50 MG PO TABS
50.0000 mg | ORAL_TABLET | Freq: Once | ORAL | Status: AC
  Administered 2013-09-14: 50 mg via ORAL
  Filled 2013-09-14: qty 1

## 2013-09-14 NOTE — ED Provider Notes (Signed)
CSN: [id number]     Arrival date & time 09/14/13  1808 History   First MD Initiated Contact with Patient 09/14/13 1810     Chief Complaint  Patient presents with  . Shortness of Breath     (Consider location/radiation/quality/duration/timing/severity/associated sxs/prior Treatment) HPI Patient reports he started getting short of breath 1-2 weeks ago. He states he has a cough that has minimal mucus production, at times it's yellow. He denies any fever or chills. He states he has been wheezing. He states he did have diarrhea for 4-5 days however it resolved several days ago. He denies nausea or vomiting. He hasn't. Umbilical abdominal pain. He states the center of his chest hurts when he coughs and he describes the pain is sharp. He denies any swelling in his legs. He states normally his nebulizers helps however now they are not. He also describes some chronic myalgia that he has had for 10 years.  PCP Dr Sherrie Sport Urology Dr Michela Pitcher  Past Medical History  Diagnosis Date  . COPD (chronic obstructive pulmonary disease)   . Hypertension   . Pneumonia   . Systolic heart failure   . AF (atrial fibrillation) 08/21/2011  . Anxiety   . History of kidney stones   . Cancer   . Prostate cancer   . Leukemia   . Leukemia   . Chronic kidney disease    Past Surgical History  Procedure Laterality Date  . Colon surgery    . Prostate surgery  1999  . Transurethral resection of bladder tumor  08/25/2011    Procedure: TRANSURETHRAL RESECTION OF BLADDER TUMOR (TURBT);  Surgeon: Marissa Nestle, MD;  Location: [facility];  Service: Urology;  Laterality: N/A;  Transurethral Resection of Bladder Neck  . Cystoscopy with urethral dilatation  12/30/2011    Procedure: CYSTOSCOPY WITH URETHRAL DILATATION;  Surgeon: Marissa Nestle, MD;  Location: [facility];  Service: Urology;;  . Collene Mares  12/30/2011    Procedure: CYSTOSCOPY/URETHROTOMY;  Surgeon: Marissa Nestle, MD;  Location: [facility];  Service: Urology;;  .  Foreign body removal  12/30/2011    Procedure: FOREIGN BODY REMOVAL;  Surgeon: Marissa Nestle, MD;  Location: [facility];  Service: Urology;;  Transurethral Removal of 4 clips from bladder neck  . Partial colectomy  2012    [facility]  . Transurethral resection of bladder neck  07/27/2012    Procedure: TRANSURETHRAL RESECTION OF BLADDER NECK;  Surgeon: Marissa Nestle, MD;  Location: [facility];  Service: Urology;;  . Cystoscopy with urethral dilatation  07/27/2012    Procedure: CYSTOSCOPY WITH URETHRAL DILATATION;  Surgeon: Marissa Nestle, MD;  Location: [facility];  Service: Urology;;  . Foreign body removal  07/27/2012    Procedure: FOREIGN BODY REMOVAL-CLIPS BLADDER NECK;  Surgeon: Marissa Nestle, MD;  Location: [facility];  Service: Urology;;  . Cataract extraction w/phaco Right 10/04/2012    Procedure: CATARACT EXTRACTION PHACO AND INTRAOCULAR LENS PLACEMENT ([place]);  Surgeon: Tonny Branch, MD;  Location: [facility];  Service: Ophthalmology;  Laterality: Right;  CDE:12.16  . Eye surgery    . Cataract extraction w/phaco Left 10/18/2012    Procedure: CATARACT EXTRACTION PHACO AND INTRAOCULAR LENS PLACEMENT ([facility]);  Surgeon: Tonny Branch, MD;  Location: [facility];  Service: Ophthalmology;  Laterality: Left;  CDE:14.42   Family History  Problem Relation Age of Onset  . Heart attack Mother   . Cancer Father   . Stroke Father    History  Substance Use Topics  .  Smoking status: Current Every Day Smoker -- 0.50 packs/day for 42 years    Types: Cigarettes  . Smokeless tobacco: Former Systems developer  . Alcohol Use: No  lives at home Lives with spouse Uses oxygen 2.5-3 lpm [place]  Review of Systems  All other systems reviewed and are negative.     Allergies  Review of patient's allergies indicates no known allergies.  Home Medications   Prior to Admission medications   Medication Sig Start Date End Date Taking? Authorizing Provider  ALPRAZolam Duanne Moron) 1 MG tablet Take 1 mg by mouth 3 (three) times daily as needed for  anxiety.   Yes Historical Provider, MD  atorvastatin (LIPITOR) 20 MG tablet Take 1 tablet (20 mg total) by mouth daily at 6 PM. 02/22/13  Yes Clent Demark, MD  carvedilol (COREG) 12.5 MG tablet Take 1 tablet (12.5 mg total) by mouth 2 (two) times daily with a meal. 02/22/13  Yes Clent Demark, MD  ciprofloxacin (CIPRO) 500 MG tablet Take 500 mg by mouth 2 (two) times daily. For 10 days 09/10/13  Yes Historical Provider, MD  digoxin (LANOXIN) 0.25 MG tablet Take 0.25 mg by mouth daily.   Yes Historical Provider, MD  Fluticasone-Salmeterol (ADVAIR DISKUS) 250-50 MCG/DOSE AEPB Inhale 1 puff into the lungs every 12 (twelve) hours.     Yes Historical Provider, MD  furosemide (LASIX) 40 MG tablet Take 40 mg by mouth 2 (two) times daily.   Yes Historical Provider, MD  imatinib (GLEEVEC) 400 MG tablet Take 400 mg by mouth daily. Take with meals and large glass of water.Caution:Chemotherapy.   Yes Historical Provider, MD  lisinopril (PRINIVIL,ZESTRIL) 40 MG tablet Take 40 mg by mouth daily. 08/22/13  Yes Historical Provider, MD  metoprolol succinate (TOPROL-XL) 50 MG 24 hr tablet Take 50 mg by mouth daily. 08/22/13  Yes Historical Provider, MD  potassium chloride (K-DUR,KLOR-CON) 10 MEQ tablet Take 40 mEq by mouth daily.    Yes Historical Provider, MD  Rivaroxaban (XARELTO) 20 MG TABS tablet Take 1 tablet (20 mg total) by mouth daily with supper. 02/22/13  Yes Clent Demark, MD  spironolactone (ALDACTONE) 25 MG tablet Take 1 tablet (25 mg total) by mouth daily. 02/22/13  Yes Clent Demark, MD  venlafaxine (EFFEXOR) 75 MG tablet Take 75 mg by mouth 2 (two) times daily.   Yes Historical Provider, MD  albuterol (PROVENTIL HFA;VENTOLIN HFA) 108 (90 BASE) MCG/ACT inhaler Inhale 2 puffs into the lungs every 6 (six) hours as needed. For shortness of breath     Historical Provider, MD  albuterol (PROVENTIL) (2.5 MG/3ML) 0.083% nebulizer solution Take 2.5 mg by nebulization every 6 (six) hours as needed for  wheezing or shortness of breath.    Historical Provider, MD  docusate sodium (COLACE) 100 MG capsule Take 100 mg by mouth as needed. For constipation     Historical Provider, MD  HYDROcodone-acetaminophen (NORCO/VICODIN) 5-325 MG per tablet Take 1 tablet by mouth 3 (three) times daily as needed. pain 09/10/13   Historical Provider, MD  mometasone (ELOCON) 0.1 % cream Apply 1 application topically daily as needed. Skin lesions     Historical Provider, MD  oxyCODONE-acetaminophen (PERCOCET) 7.5-325 MG per tablet Take 1 tablet by mouth every 6 (six) hours as needed. pain 09/12/13   Historical Provider, MD  polyethylene glycol (MIRALAX / GLYCOLAX) packet Take 17 g by mouth daily as needed (constipation).    Historical Provider, MD  traMADol (ULTRAM) 50 MG tablet Take 50 mg by mouth 3 (  three) times daily as needed. pain 02/11/13   Historical Provider, MD   BP 103/78  Pulse 78  Resp 16  SpO2 98%  Vital signs normal   Physical Exam  Nursing note and vitals reviewed. Constitutional: He is oriented to person, place, and time. He appears well-developed and well-nourished.  Non-toxic appearance. He does not appear ill. No distress.  HENT:  Head: Normocephalic and atraumatic.  Right Ear: External ear normal.  Left Ear: External ear normal.  Nose: Nose normal. No mucosal edema or rhinorrhea.  Mouth/Throat: Oropharynx is clear and moist and mucous membranes are normal. No dental abscesses or uvula swelling.  Eyes: Conjunctivae and EOM are normal. Pupils are equal, round, and reactive to light.  Neck: Normal range of motion and full passive range of motion without pain. Neck supple.  Cardiovascular: Normal rate, regular rhythm and normal heart sounds.  Exam reveals no gallop and no friction rub.   No murmur heard. Pulmonary/Chest: Effort normal. No respiratory distress. He has decreased breath sounds. He has no wheezes. He has no rhonchi. He has no rales. He exhibits no tenderness and no crepitus.   Abdominal: Soft. Normal appearance and bowel sounds are normal. He exhibits no distension. There is no tenderness. There is no rebound and no guarding.  Musculoskeletal: Normal range of motion. He exhibits no edema and no tenderness.  Moves all extremities well.   Neurological: He is alert and oriented to person, place, and time. He has normal strength. No cranial nerve deficit.  Skin: Skin is warm, dry and intact. No rash noted. No erythema. No pallor.  Psychiatric: He has a normal mood and affect. His speech is normal and behavior is normal. His mood appears not anxious.    ED Course  Procedures (including critical care time)  Medications  albuterol (PROVENTIL) (2.5 MG/3ML) 0.083% nebulizer solution 5 mg (5 mg Nebulization Given 09/14/13 1920)  ipratropium (ATROVENT) nebulizer solution 0.5 mg (0.5 mg Nebulization Given 09/14/13 1920)  methylPREDNISolone sodium succinate (SOLU-MEDROL) 125 mg/2 mL injection 125 mg (125 mg Intravenous Given 09/14/13 1912)  traMADol (ULTRAM) tablet 50 mg (50 mg Oral Given 09/14/13 1910)  traMADol (ULTRAM) tablet 50 mg (50 mg Oral Given 09/14/13 2018)  furosemide (LASIX) injection 60 mg (60 mg Intravenous Given 09/14/13 2107)   Recheck after his nebulizer shows patient has improved air movement. Patient is noted to be off his oxygen in the room and his pulse ox is 96% on room air. He is ambulatory in his room and around the nurse's station in no distress.  Patient had requested pain medication. It looked him up on the [facility] and gave him his usual tramadol.  Review of the [place] shows patient gets #90 tramadol 50 mg tablets every month from Dr. Olena Leatherwood, the last was May 1. He also has only one prescription for #30 oxycodone 7.5/325 that was dispensed 2 days ago. Patient states he has taken all of those pills already. He then told the nurse he only was dispensed 7 pills.  Patient was given Lasix for possible mild fluid  retention.  Labs Review Results for orders placed during the hospital encounter of 09/14/13  CBC WITH DIFFERENTIAL      Result Value Ref Range   WBC 9.0  4.0 - 10.5 K/uL   RBC 4.59  4.22 - 5.81 MIL/uL   Hemoglobin 13.7  13.0 - 17.0 g/dL   HCT 31.1  21.6 - 24.4 %   MCV 91.7  78.0 -  100.0 fL   MCH 29.8  26.0 - 34.0 pg   MCHC 32.5  30.0 - 36.0 g/dL   RDW 15.2  11.5 - 15.5 %   Platelets 253  150 - 400 K/uL   Neutrophils Relative % 63  43 - 77 %   Neutro Abs 5.6  1.7 - 7.7 K/uL   Lymphocytes Relative 28  12 - 46 %   Lymphs Abs 2.5  0.7 - 4.0 K/uL   Monocytes Relative 6  3 - 12 %   Monocytes Absolute 0.6  0.1 - 1.0 K/uL   Eosinophils Relative 3  0 - 5 %   Eosinophils Absolute 0.3  0.0 - 0.7 K/uL   Basophils Relative 0  0 - 1 %   Basophils Absolute 0.0  0.0 - 0.1 K/uL  COMPREHENSIVE METABOLIC PANEL      Result Value Ref Range   Sodium 139  137 - 147 mEq/L   Potassium 4.5  3.7 - 5.3 mEq/L   Chloride 100  96 - 112 mEq/L   CO2 27  19 - 32 mEq/L   Glucose, Bld 108 (*) 70 - 99 mg/dL   BUN 31 (*) 6 - 23 mg/dL   Creatinine, Ser 1.34  0.50 - 1.35 mg/dL   Calcium 9.1  8.4 - 10.5 mg/dL   Total Protein 6.5  6.0 - 8.3 g/dL   Albumin 3.5  3.5 - 5.2 g/dL   AST 13  0 - 37 U/L   ALT 12  0 - 53 U/L   Alkaline Phosphatase 75  39 - 117 U/L   Total Bilirubin 0.5  0.3 - 1.2 mg/dL   GFR calc non Af Amer 53 (*) >90 mL/min   GFR calc Af Amer 61 (*) >90 mL/min  TROPONIN I      Result Value Ref Range   Troponin I <0.30  <0.30 ng/mL  PRO B NATRIURETIC PEPTIDE      Result Value Ref Range   Pro B Natriuretic peptide (BNP) 1048.0 (*) 0 - 125 pg/mL     Laboratory interpretation all normal except an elevated BNP but it is in the mid to lower range of his prior values.    Imaging Review Dg Chest 2 View  09/14/2013   CLINICAL DATA:  Chest pain.  EXAM: CHEST  2 VIEW  COMPARISON:  None.  FINDINGS: Mediastinum and hilar structures are normal. The lungs are clear. Cardiomegaly with normal pulmonary  vascularity. No pleural effusion or pneumothorax. Diffuse degenerative changes and osteopenia thoracic spine .  IMPRESSION: 1.  Cardiomegaly, no CHF.  2.  No acute pulmonary disease.   Electronically Signed   By: Marcello Moores  Register   On: 09/14/2013 19:11     EKG Interpretation   Date/Time:  Wednesday Sep 14 2013 18:09:26 EDT Ventricular Rate:  71 PR Interval:    QRS Duration: 142 QT Interval:  373 QTC Calculation: 405 R Axis:   -79 Text Interpretation:  Atrial fibrillation Nonspecific IVCD with LAD  Anteroseptal infarct, old Abnormal T, consider ischemia, lateral leads  Since last tracing rate slower Confirmed by Shian Goodnow  MD-I, Lelynd Poer ([id number]) on  09/14/2013 9:49:38 PM      MDM   Final diagnoses:  Dyspnea  Chronic pain    Plan discharge  Rolland Porter, MD, Alanson Aly, MD 09/14/13 2151

## 2013-09-14 NOTE — Discharge Instructions (Signed)
You will need to talk to Dr Michela Pitcher and Dr Sherrie Sport about your pain medication. You were just prescribed # 90 ultram on 5/1 and # 30 oxycodone/acetaminophen 7.5/325 2 days ago.   Chronic Pain Discharge Instructions  Emergency care providers appreciate that many patients coming to [place] are in severe pain and we wish to address their pain in the safest, most responsible manner.  It is important to recognize however, that the proper treatment of chronic pain differs from that of the pain of injuries and acute illnesses.  Our goal is to provide quality, safe, personalized care and we thank you for giving [place] the opportunity to serve you. The use of narcotics and related agents for chronic pain syndromes may lead to additional physical and psychological problems.  Nearly as many people die from prescription narcotics each year as die from car crashes.  Additionally, this risk is increased if such prescriptions are obtained from a variety of sources.  Therefore, only your primary care physician or a pain management specialist is able to safely treat such syndromes with narcotic medications long-term.    Documentation revealing such prescriptions have been sought from multiple sources may prohibit [place] from providing a refill or different narcotic medication.  Your name may be checked first through the [place].  This database is a record of controlled substance medication prescriptions that the patient has received.  This has been established by [facility] in an effort to eliminate the dangerous, and often life threatening, practice of obtaining multiple prescriptions from different medical providers.   If you have a chronic pain syndrome (i.e. chronic headaches, recurrent back or neck pain, dental pain, abdominal or pelvis pain without a specific diagnosis, or neuropathic pain such as fibromyalgia) or recurrent visits for the same condition without an acute diagnosis, you  may be treated with non-narcotics and other non-addictive medicines.  Allergic reactions or negative side effects that may be reported by a patient to such medications will not typically lead to the use of a narcotic analgesic or other controlled substance as an alternative.   Patients managing chronic pain with a personal physician should have provisions in place for breakthrough pain.  If you are in crisis, you should call your physician.  If your physician directs you to the emergency department, please have the doctor call and speak to our attending physician concerning your care.   When patients come to the Emergency Department (ED) with acute medical conditions in which the Emergency Department physician feels appropriate to prescribe narcotic or sedating pain medication, the physician will prescribe these in very limited quantities.  The amount of these medications will last only until you can see your primary care physician in his/her office.  Any patient who returns to the ED seeking refills should expect only non-narcotic pain medications.   In the event of an acute medical condition exists and the emergency physician feels it is necessary that the patient be given a narcotic or sedating medication -  a responsible adult driver should be present in the room prior to the medication being given by the nurse.   Prescriptions for narcotic or sedating medications that have been lost, stolen or expired will not be refilled in the Emergency Department.    Patients who have chronic pain may receive non-narcotic prescriptions until seen by their primary care physician.  It is every patients personal responsibility to maintain active prescriptions with his or her primary care physician or specialist.

## 2013-09-14 NOTE — ED Notes (Signed)
SOB over last 3 months- with increase over last few days. 95%  On RA.

## 2014-03-28 DEATH — deceased

## 2014-04-06 ENCOUNTER — Encounter (HOSPITAL_COMMUNITY): Payer: Self-pay | Admitting: Cardiology

## 2014-05-11 ENCOUNTER — Encounter (HOSPITAL_COMMUNITY): Payer: Self-pay | Admitting: Urology

## 2014-11-22 IMAGING — CT CT ABD-PELV W/O CM
2 of 3 series · 16 of 46 positions shown, 18 images · non-contrast
Comparison: Prior CT from 12/31/2012

CLINICAL DATA: Growing pain, nephrolithiasis

CT ABDOMEN AND PELVIS WITHOUT CONTRAST
TECHNIQUE: Multidetector CT imaging of the abdomen and pelvis was
performed following the standard protocol without intravenous
contrast.

[Series 2: standard/full over (age)lbs 5.0 · axial · 0.84mm/px · z∈[+319,+724]mm · 13 of 94 slices shown, 15 images]
[im 7/94  soft-tissue]
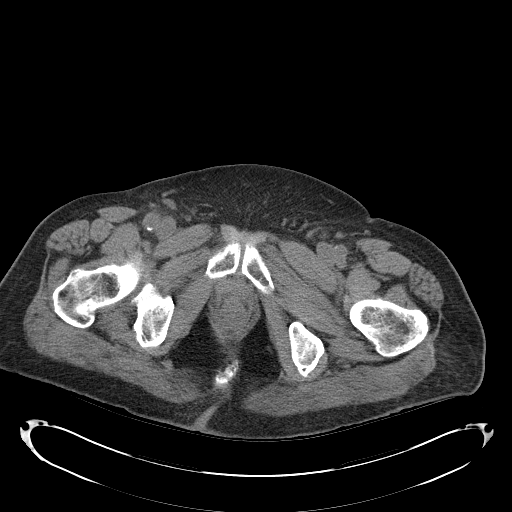
[im 7/94  bone]
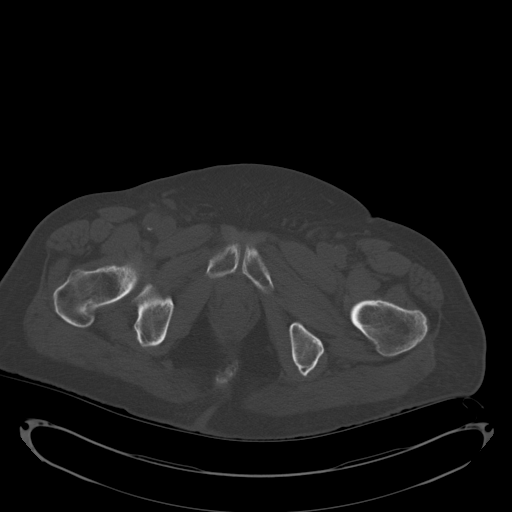
[im 13/94  soft-tissue]
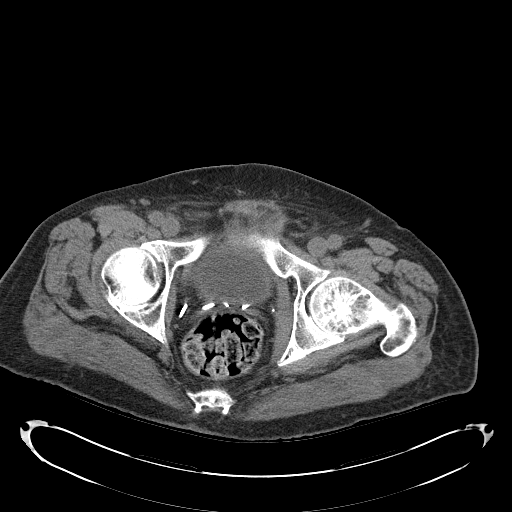
[im 19/94  soft-tissue]
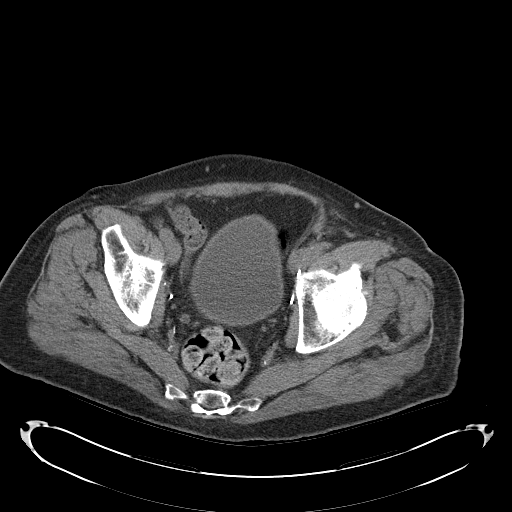
[im 28/94  soft-tissue]
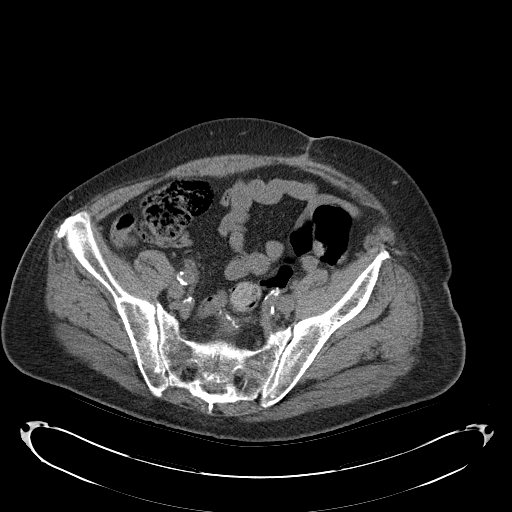
[im 34/94  soft-tissue]
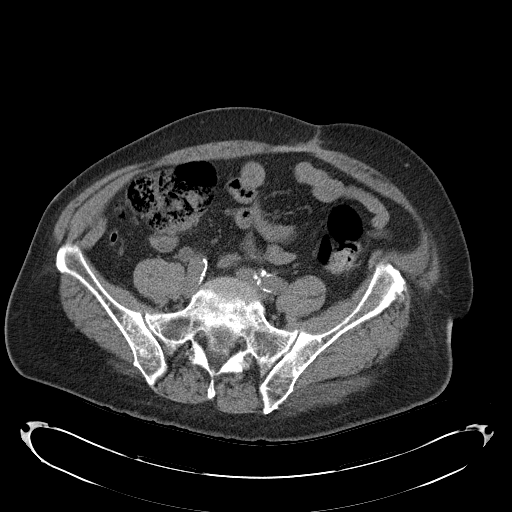
[im 40/94  soft-tissue]
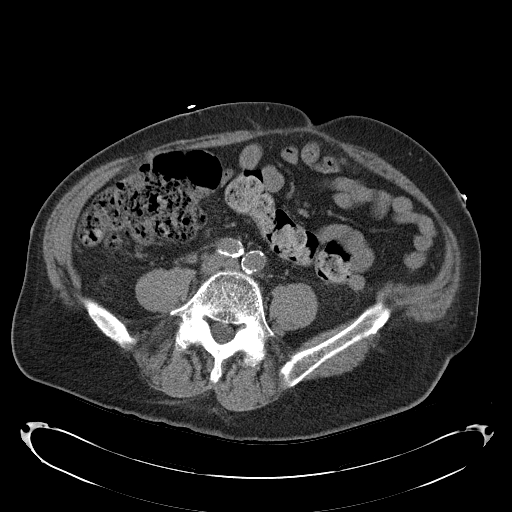
[im 49/94  soft-tissue]
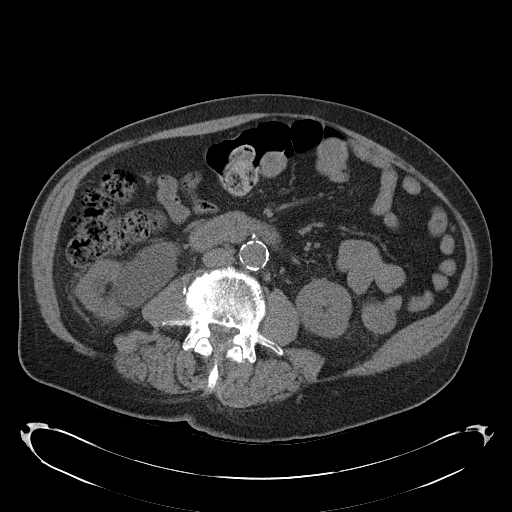
[im 55/94  soft-tissue]
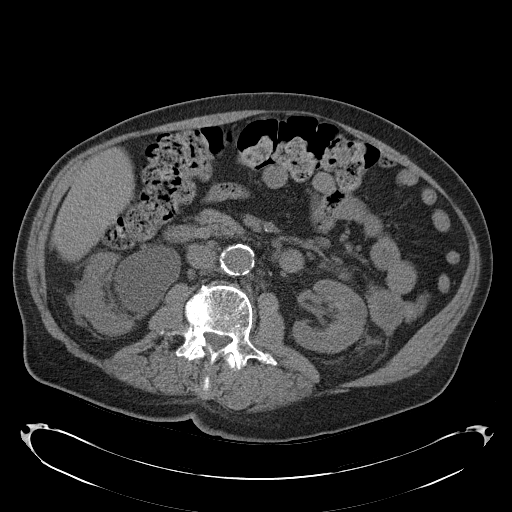
[im 61/94  soft-tissue]
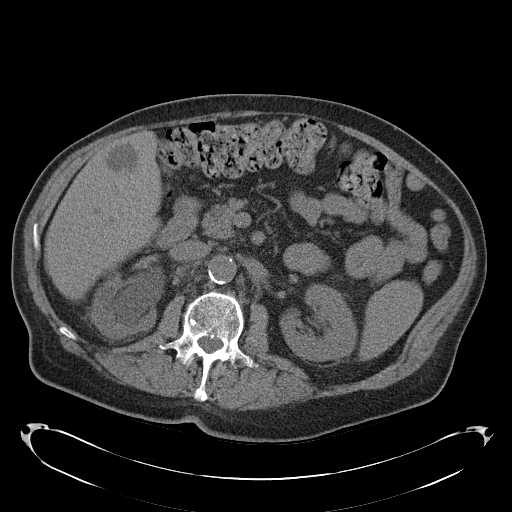
[im 61/94  bone]
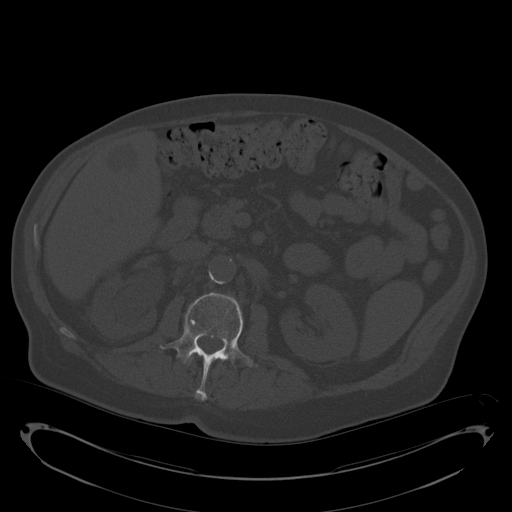
[im 67/94  soft-tissue]
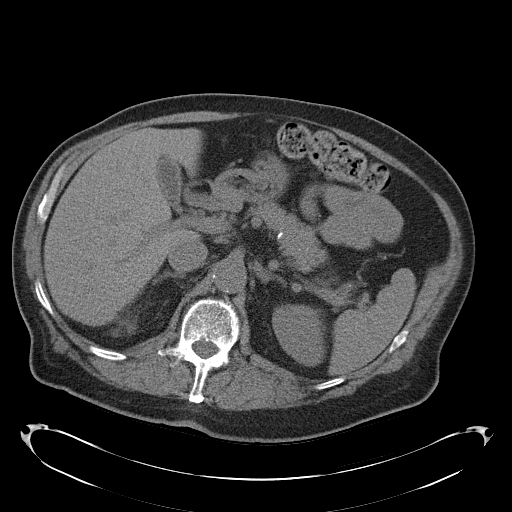
[im 76/94  soft-tissue]
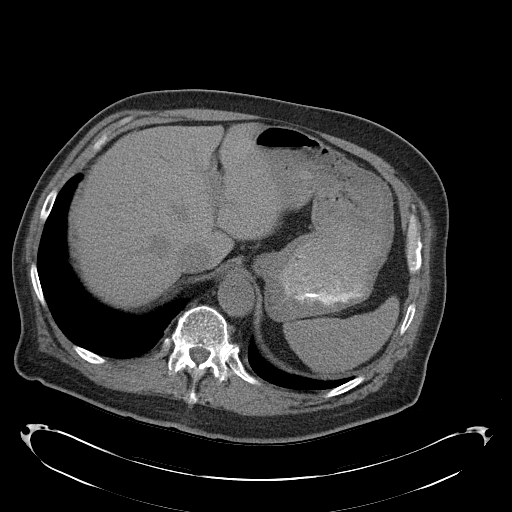
[im 82/94  soft-tissue]
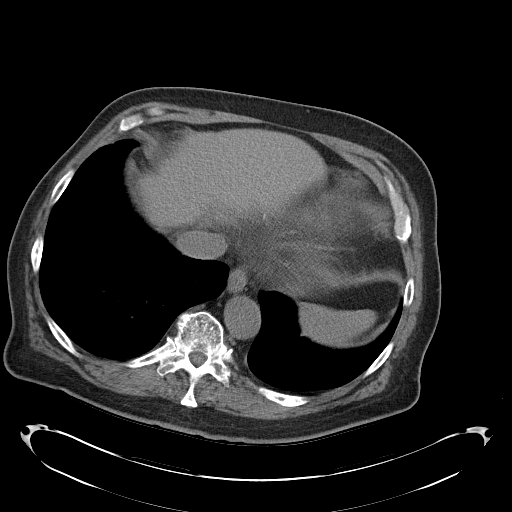
[im 88/94  soft-tissue]
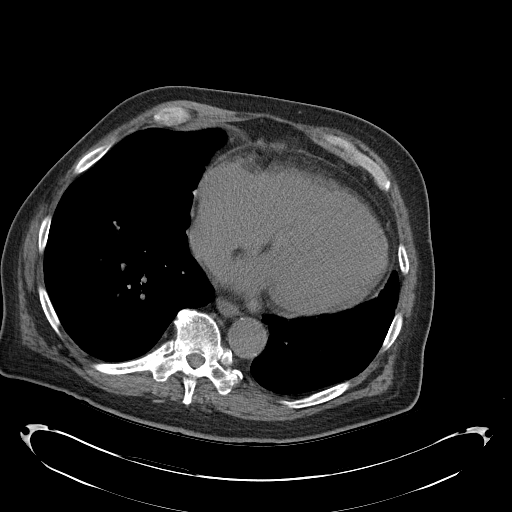

[Series 4: mpr coronal · coronal · 0.92mm/px · 3 of 115 slices shown]
[im 39/115  soft-tissue]
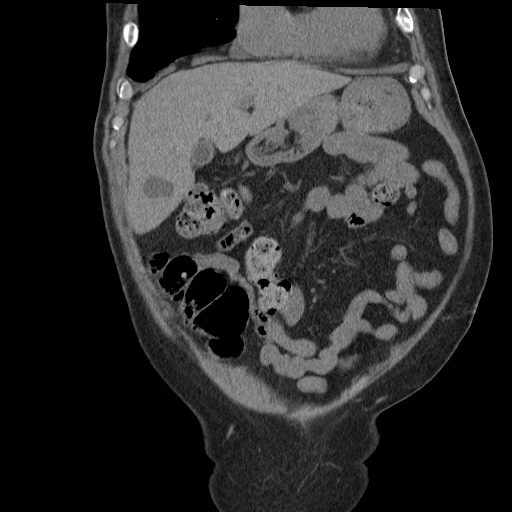
[im 51/115  soft-tissue]
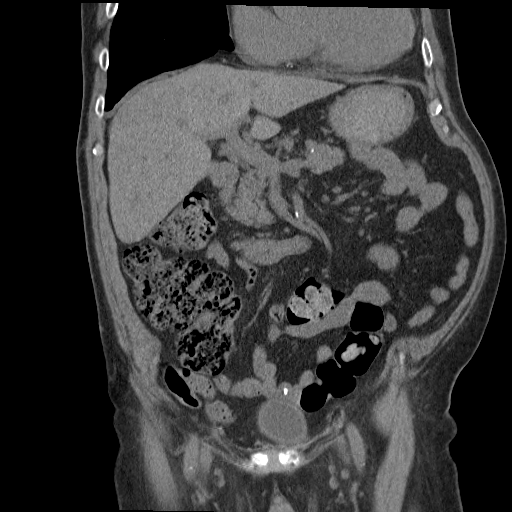
[im 64/115  soft-tissue]
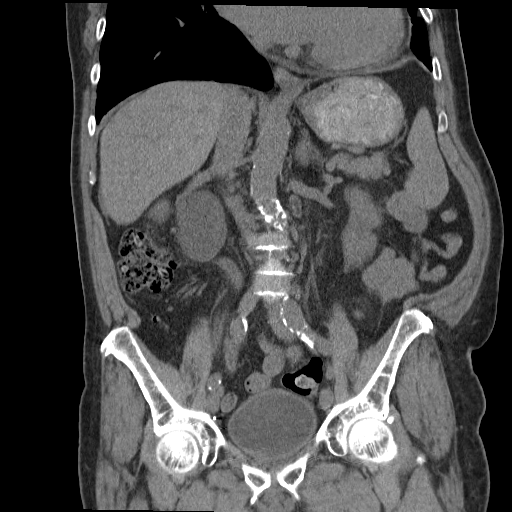

[16 of 46 positions shown; findings below may reference images not displayed]

FINDINGS: Visualized lung bases are clear.

3.3 cm cyst within the inferior right liver is unchanged.  Limited
noncontrast evaluation the liver is otherwise unremarkable.
Gallbladder is normal.  There is no biliary ductal dilatation.  The
spleen, adrenal glands, and pancreas demonstrate normal unenhanced
appearance.

Nonobstructive 9 mm stone is again seen within the lower pole of
the left kidney, unchanged.  There is no left-sided hydronephrosis
or hydroureter.  No stones seen along the course of the left renal
collecting system. Is identified cystic lesions within the left
kidney are not as well seen on today's examination due to lack of
IV contrast.

There has been interval worsening of right sided hydronephrosis and
hydroureter as compared to the prior examination.  No obstructive
stones are seen along the course of the right renal collecting
system. The right ureter is dilated down to the level of the UVJ.
Evaluation for possible obstructive masses is limited on this
noncontrast examination.  Several punctate nonobstructive stones
are again seen within the upper pole of the right kidney,
unchanged.  There is right-sided perinephric stranding, slightly
increased as compared to prior study.  The right kidney overall
appears slightly atrophic as compared to the left.

There is no evidence of bowel obstruction.  Appendix is normal.

Bladder is within normal limits. Multiple surgical clips are again
seen within the pelvis, suggestive of prior prostatectomy.  Suture
line again noted within the sigmoid colon.

No free air or fluid.  No pathologically enlarged intra-abdominal
pelvic lymph nodes.

Prominent atheromatous disease is again seen throughout the intra-
abdominal aorta and its branch vessels.

No acute osseous abnormality identified.
IMPRESSION: 1.  Interval worsening of right hydroureteronephrosis as compared
to prior CT from 12/31/2012. The etiology for this obstruction is
not evident on this noncontrast examination.
2.  Stable bilateral nonobstructive nephrolithiasis.
